# Patient Record
Sex: Male | Born: 1955
Health system: Southern US, Community
[De-identification: ages and names within clinical notes are randomized; demographics above are authoritative.]

## PROBLEM LIST (undated history)

## (undated) DIAGNOSIS — K649 Unspecified hemorrhoids: Secondary | ICD-10-CM

## (undated) DIAGNOSIS — E785 Hyperlipidemia, unspecified: Secondary | ICD-10-CM

## (undated) DIAGNOSIS — A6000 Herpesviral infection of urogenital system, unspecified: Secondary | ICD-10-CM

## (undated) DIAGNOSIS — E291 Testicular hypofunction: Secondary | ICD-10-CM

## (undated) DIAGNOSIS — G709 Myoneural disorder, unspecified: Secondary | ICD-10-CM

## (undated) DIAGNOSIS — K635 Polyp of colon: Secondary | ICD-10-CM

## (undated) DIAGNOSIS — O2230 Deep phlebothrombosis in pregnancy, unspecified trimester: Secondary | ICD-10-CM

## (undated) DIAGNOSIS — R011 Cardiac murmur, unspecified: Secondary | ICD-10-CM

## (undated) DIAGNOSIS — F32A Depression, unspecified: Secondary | ICD-10-CM

## (undated) DIAGNOSIS — F329 Major depressive disorder, single episode, unspecified: Secondary | ICD-10-CM

## (undated) DIAGNOSIS — I4892 Unspecified atrial flutter: Secondary | ICD-10-CM

## (undated) HISTORY — DX: Unspecified atrial flutter: I48.92

## (undated) HISTORY — DX: Hyperlipidemia, unspecified: E78.5

## (undated) HISTORY — DX: Depression, unspecified: F32.A

## (undated) HISTORY — DX: Major depressive disorder, single episode, unspecified: F32.9

## (undated) HISTORY — DX: Testicular hypofunction: E29.1

## (undated) HISTORY — PX: KNEE ARTHROSCOPY: SHX127

## (undated) HISTORY — DX: Cardiac murmur, unspecified: R01.1

## (undated) HISTORY — DX: Herpesviral infection of urogenital system, unspecified: A60.00

## (undated) HISTORY — DX: Polyp of colon: K63.5

## (undated) HISTORY — DX: Unspecified hemorrhoids: K64.9

---

## 1988-12-17 HISTORY — PX: VASECTOMY: SHX75

## 2013-02-09 LAB — HM COLONOSCOPY: HM COLON: NORMAL

## 2014-04-22 ENCOUNTER — Ambulatory Visit (INDEPENDENT_AMBULATORY_CARE_PROVIDER_SITE_OTHER): Payer: BC Managed Care – PPO | Admitting: Internal Medicine

## 2014-04-22 ENCOUNTER — Encounter: Payer: Self-pay | Admitting: Internal Medicine

## 2014-04-22 VITALS — BP 118/76 | HR 60 | Temp 98.2°F | Ht 70.5 in | Wt 207.5 lb

## 2014-04-22 DIAGNOSIS — E291 Testicular hypofunction: Secondary | ICD-10-CM | POA: Insufficient documentation

## 2014-04-22 DIAGNOSIS — I4892 Unspecified atrial flutter: Secondary | ICD-10-CM | POA: Insufficient documentation

## 2014-04-22 DIAGNOSIS — E781 Pure hyperglyceridemia: Secondary | ICD-10-CM

## 2014-04-22 MED ORDER — TESTOSTERONE 20.25 MG/1.25GM (1.62%) TD GEL: 4.0000 "application " | Freq: Every day | TRANSDERMAL | Status: DC

## 2014-04-22 MED ORDER — EZETIMIBE 10 MG PO TABS: 10.0000 mg | ORAL_TABLET | Freq: Every day | ORAL | Status: DC

## 2014-04-22 MED ORDER — METOPROLOL SUCCINATE ER 25 MG PO TB24: 25.0000 mg | ORAL_TABLET | Freq: Two times a day (BID) | ORAL | Status: DC

## 2014-04-22 NOTE — Assessment & Plan Note (Signed)
occasional episodes Controlled on metoprolol

## 2014-04-22 NOTE — Patient Instructions (Addendum)

## 2014-04-22 NOTE — Progress Notes (Signed)
Pre visit review using our clinic review tool, if applicable. No additional management support is needed unless otherwise documented below in the visit note. 

## 2014-04-22 NOTE — Progress Notes (Signed)
HPI  Pt presents to the clinic today to establish care. He recently moved from [place], [place]. He is transferring care from Dr. Harland Dingwall. He does need some medication refills. Otherwise, he has no concerns today.  Flu: 10/2013 Tetanus: less than 10 years Pneumovax: 2013 Zostovax: had it but unsure of date Colon Screening: 2013, polyps, 5 years PSA screening: 2014 Eye Doctor: yearly Doctor: yearly  Past Medical History  Diagnosis Date  . Depression   . Heart murmur   . Colon polyps     Current Outpatient Prescriptions  Medication Sig Dispense Refill  . aspirin 325 MG EC tablet Take 325 mg by mouth daily.      . Cholecalciferol (VITAMIN D3) 2000 UNITS TABS Take 1 capsule by mouth daily.      Marland Kitchen ezetimibe (ZETIA) 10 MG tablet Take 10 mg by mouth daily.      Marland Kitchen glucosamine-chondroitin 500-400 MG tablet Take 1 tablet by mouth 2 (two) times daily.      . metoprolol succinate (TOPROL-XL) 25 MG 24 hr tablet Take 25 mg by mouth 2 (two) times daily.      . NON FORMULARY 2 capsules daily. Celery seed extract      . Omega-3 Fatty Acids (FISH OIL) 1200 MG CAPS Take 5 capsules by mouth daily.      . Testosterone (ANDROGEL) 20.25 MG/1.25GM (1.62%) GEL Place 4 application onto the skin daily.      . valACYclovir (VALTREX) 1000 MG tablet Take 1,000 mg by mouth daily.       No current facility-administered medications for this visit.    No Known Allergies  Family History  Problem Relation Age of Onset  . Cancer Mother     breast  . Heart disease Mother   . Hyperlipidemia Mother   . Hypertension Mother   . Cancer Father     Colon  . Heart disease Father   . Alzheimer's disease Father   . Hyperlipidemia Father   . Hyperlipidemia Brother     History   Social History  . Marital Status: Married    Spouse Name: N/A    Number of Children: N/A  . Years of Education: N/A   Occupational History  . Not on file.   Social History Main Topics  . Smoking status: Never Smoker   .  Smokeless tobacco: Never Used  . Alcohol Use: 8.4 oz/week    14 Shots of liquor per week     Comment: moderate  . Drug Use: No  . Sexual Activity: Not on file   Other Topics Concern  . Not on file   Social History Narrative  . No narrative on file    ROS:  Constitutional: Denies fever, malaise, fatigue, headache or abrupt weight changes.  Respiratory: Denies difficulty breathing, shortness of breath, cough or sputum production.   Cardiovascular: Denies chest pain, chest tightness, palpitations or swelling in the hands or feet.  Neurological: Denies dizziness, difficulty with memory, difficulty with speech or problems with balance and coordination.   No other specific complaints in a complete review of systems (except as listed in HPI above).  PE:  BP 118/76  Pulse 60  Temp(Src) 98.2 F (36.8 C) (Oral)  Ht 5' 10.5" (1.791 m)  Wt 207 lb 8 oz (94.121 kg)  BMI 29.34 kg/m2  SpO2 98% Wt Readings from Last 3 Encounters:  04/22/14 207 lb 8 oz (94.121 kg)    General: Appears his stated age, well developed, well nourished in NAD. Cardiovascular:  Normal rate and rhythm. S1,S2 noted.  No murmur, rubs or gallops noted. No JVD or BLE edema. No carotid bruits noted. Pulmonary/Chest: Normal effort and positive vesicular breath sounds. No respiratory distress. No wheezes, rales or ronchi noted.  Neurological: Alert and oriented. Cranial nerves II-XII intact. Coordination normal. +DTRs bilaterally. Psychiatric: Mood and affect normal. Behavior is normal. Judgment and thought content normal.    Assessment and Plan:

## 2014-04-22 NOTE — Assessment & Plan Note (Signed)
On testosterone gel Medication refilled today

## 2014-04-22 NOTE — Assessment & Plan Note (Signed)
In a study at [facility] Just had labs yesterday He will bring a copy of them for me to review

## 2014-06-21 ENCOUNTER — Other Ambulatory Visit: Payer: Self-pay

## 2014-06-21 DIAGNOSIS — E291 Testicular hypofunction: Secondary | ICD-10-CM

## 2014-09-06 ENCOUNTER — Encounter: Payer: Self-pay | Admitting: Internal Medicine

## 2014-09-06 ENCOUNTER — Ambulatory Visit (INDEPENDENT_AMBULATORY_CARE_PROVIDER_SITE_OTHER): Payer: BC Managed Care – PPO | Admitting: Internal Medicine

## 2014-09-06 VITALS — BP 114/60 | HR 71 | Temp 98.7°F | Wt 219.0 lb

## 2014-09-06 DIAGNOSIS — B070 Plantar wart: Secondary | ICD-10-CM

## 2014-09-06 DIAGNOSIS — J069 Acute upper respiratory infection, unspecified: Secondary | ICD-10-CM

## 2014-09-06 DIAGNOSIS — J302 Other seasonal allergic rhinitis: Secondary | ICD-10-CM

## 2014-09-06 DIAGNOSIS — J309 Allergic rhinitis, unspecified: Secondary | ICD-10-CM

## 2014-09-06 NOTE — Progress Notes (Signed)
Subjective:    Patient ID: Harold Schwartz, male    DOB: 08-03-1956, 58 y.o.   MRN: [id number]  HPI Patient presents with sore throat and sinus congestion for the past two days. Has used salt gargles and mucinex with some  relief. Lost voice on Saturday, has had slight cough and some green nasal discharge. Has warts on his right foot, has been treating them with OTC wart remover, wants to know if they really are warts.  Review of Systems  Past Medical History  Diagnosis Date  . Depression   . Heart murmur   . Colon polyps     Current Outpatient Prescriptions  Medication Sig Dispense Refill  . aspirin 325 MG EC tablet Take 325 mg by mouth daily.      . Cholecalciferol (VITAMIN D3) 2000 UNITS TABS Take 1 capsule by mouth daily.      Marland Kitchen ezetimibe (ZETIA) 10 MG tablet Take 1 tablet (10 mg total) by mouth daily.  30 tablet  11  . glucosamine-chondroitin 500-400 MG tablet Take 1 tablet by mouth 2 (two) times daily.      . Magnesium 100 MG TABS Take 1 tablet by mouth.      . metoprolol succinate (TOPROL-XL) 25 MG 24 hr tablet Take 1 tablet (25 mg total) by mouth 2 (two) times daily.  60 tablet  11  . NON FORMULARY 2 capsules daily. Celery seed extract      . Omega-3 Fatty Acids (FISH OIL) 1200 MG CAPS Take 5 capsules by mouth daily.      . Testosterone (ANDROGEL) 20.25 MG/1.25GM (1.62%) GEL Place 4 application onto the skin daily.  1.25 g  5  . valACYclovir (VALTREX) 1000 MG tablet Take 1,000 mg by mouth daily.       No current facility-administered medications for this visit.    No Known Allergies  Family History  Problem Relation Age of Onset  . Cancer Mother     breast  . Heart disease Mother   . Hyperlipidemia Mother   . Hypertension Mother   . Cancer Father     Colon  . Heart disease Father   . Alzheimer's disease Father   . Hyperlipidemia Father   . Hyperlipidemia Brother     History   Social History  . Marital Status: Married    Spouse Name: N/A    Number of  Children: N/A  . Years of Education: N/A   Occupational History  . Not on file.   Social History Main Topics  . Smoking status: Never Smoker   . Smokeless tobacco: Never Used  . Alcohol Use: 8.4 oz/week    14 Shots of liquor per week     Comment: moderate  . Drug Use: No  . Sexual Activity: Not on file   Other Topics Concern  . Not on file   Social History Narrative  . No narrative on file     Constitutional: Denies fever, malaise, fatigue, headache or abrupt weight changes.  HEENT: Denies eye pain, eye redness, ear pain, ringing in the ears, wax buildup, runny nose, or bloody nose,  Respiratory: Denies difficulty breathing, shortness of breath, cough or sputum production.   Cardiovascular: Denies chest pain, chest tightness, palpitations or swelling in the hands or feet.    No other specific complaints in a complete review of systems (except as listed in HPI above).     Objective:   Physical Exam  BP 114/60  Pulse 71  Temp(Src) 98.7 F (  37.1 C) (Oral)  Wt 219 lb (99.338 kg)  SpO2 98% Wt Readings from Last 3 Encounters:  09/06/14 219 lb (99.338 kg)  04/22/14 207 lb 8 oz (94.121 kg)    General: Appears their stated age, well developed, well nourished in NAD. HEENT:Nose: mucosa pink and moist, septum midline; Throat/Mouth: Teeth present, mucosa pink and moist, no exudate, lesions or ulcerations noted.  Neck:. Neck supple, no lymphadenopathy noted.  Cardiovascular: Normal rate and rhythm. S1,S2 noted.  No murmur, rubs or gallops noted. Pulmonary/Chest: Normal effort and positive vesicular breath sounds. No respiratory distress. No wheezes, rales or ronchi noted.    BMET No results found for this basename: na, k, cl, co2, glucose, bun, creatinine, calcium, gfrnonaa, gfraa    Lipid Panel  No results found for this basename: chol, trig, hdl, cholhdl, vldl, ldlcalc    CBC No results found for this basename: wbc, rbc, hgb, hct, plt, mcv, mch, mchc, rdw, neutrabs,  lymphsabs, monoabs, eosabs, basosabs    Hgb A1C No results found for this basename: HGBA1C         Assessment & Plan:   Allergic Rhinitis  Zyrtec, flonase and continue with other supportive care  Warts See podiatry for removal of warts Please call office is symptoms worsen or do not improve

## 2014-09-06 NOTE — Patient Instructions (Addendum)
Plantar Warts Warts are benign (noncancerous) growths of the outer skin layer. They can occur at any time in life but are most common during childhood and the teen years. Warts can occur on many skin surfaces of the body. When they occur on the underside (sole) of your foot they are called plantar warts. They often emerge in groups with several small warts encircling a larger growth. CAUSES  Human papillomavirus (HPV) is the cause of plantar warts. HPV attacks a break in the skin of the foot. Walking barefoot can lead to exposure to the wart virus. Plantar warts tend to develop over areas of pressure such as the heel and ball of the foot. Plantar warts often grow into the deeper layers of skin. They may spread to other areas of the sole but cannot spread to other areas of the body. SYMPTOMS  You may also notice a growth on the undersurface of your foot. The wart may grow directly into the sole of the foot, or rise above the surface of the skin on the sole of the foot, or both. They are most often flat from pressure. Warts generally do not cause itching but may cause pain in the area of the wart when you put weight on your foot. DIAGNOSIS  Diagnosis is made by physical examination. This means your caregiver discovers it while examining your foot.  TREATMENT  There are many ways to treat plantar warts. However, warts are very tough. Sometimes it is difficult to treat them so that they go away completely and do not grow back. Any treatment must be done regularly to work. If left untreated, most plantar warts will eventually disappear over a period of one to two years. Treatments you can do at home include:  Putting duct tape over the top of the wart (occlusion) has been found to be effective over several months. The duct tape should be removed each night and reapplied until the wart has disappeared.  Placing over-the-counter medications on top of the wart to help kill the wart virus and remove the wart  tissue (salicylic acid, cantharidin, and dichloroacetic acid) are useful. These are called keratolytic agents. These medications make the skin soft and gradually layers will shed away. These compounds are usually placed on the wart each night and then covered with a bandage. They are also available in premedicated bandage form. Avoid surrounding skin when applying these liquids as these medications can burn healthy skin. The treatment may take several months of nightly use to be effective.  Cryotherapy to freeze the wart has recently become available over-the-counter for children 4 years and older. This system makes use of a soft narrow applicator connected to a bottle of compressed cold liquid that is applied directly to the wart. This medication can burn healthy skin and should be used with caution.  As with all over-the-counter medications, read the directions carefully before use. Treatments generally done in your caregiver's office include:  Some aggressive treatments may cause discomfort, discoloration, and scarring of the surrounding skin. The risks and benefits of treatment should be discussed with your caregiver.  Freezing the wart with liquid nitrogen (cryotherapy, see above).  Burning the wart with use of very high heat (cautery).  Injecting medication into the wart.  Surgically removing or laser treatment of the wart.  Your caregiver may refer you to a dermatologist for difficult to treat large-sized warts or large numbers of warts. HOME CARE INSTRUCTIONS   Soak the affected area in warm water. Dry the   area completely when you are done. Remove the top layer of softened skin, then apply the chosen topical medication and reapply a bandage.  Remove the bandage daily and file excess wart tissue (pumice stone works well for this purpose). Repeat the entire process daily or every other day for weeks until the plantar wart disappears.  Several brands of salicylic acid pads are available  as over-the-counter remedies.  Pain can be relieved by wearing a donut bandage. This is a bandage with a hole in it. The bandage is put on with the hole over the wart. This helps take the pressure off the wart and gives pain relief. To help prevent plantar warts:  Wear shoes and socks and change them daily.  Keep feet clean and dry.  Check your feet and your children's feet regularly.  Avoid direct contact with warts on other people.  Have growths or changes on your skin checked by your caregiver. Document Released: 02/23/2004 Document Revised: 04/19/2014 Document Reviewed: 08/03/2009 Exit[employer] Patient Information 2015 [employer]. This information is not intended to replace advice given to you by your health care provider. Make sure you discuss any questions you have with your health care provider.  

## 2014-09-06 NOTE — Progress Notes (Signed)
HPI  Pt presents to the clinic today with c/o sore throat, post nasal drip and nasal congestion. He reports this started 2 days ago. He reports that he is blowing green mucous out of his nose. He has had a slight cough, which is unproductive. He denies fever, chills or body aches. He has tried Mucinex, saline nasal spray and salt water gargles without relief. He has no history of allergies or breathing problems. He has not had sick contacts that he is aware of.   Also, he c/o a wart on his right foot. He noticed this 4-6 weeks ago. He has tried using the OTC wart freeze but it has not helped. He would like [place] to look at it.  Review of Systems    Past Medical History  Diagnosis Date  . Depression   . Heart murmur   . Colon polyps     Family History  Problem Relation Age of Onset  . Cancer Mother     breast  . Heart disease Mother   . Hyperlipidemia Mother   . Hypertension Mother   . Cancer Father     Colon  . Heart disease Father   . Alzheimer's disease Father   . Hyperlipidemia Father   . Hyperlipidemia Brother     History   Social History  . Marital Status: Married    Spouse Name: N/A    Number of Children: N/A  . Years of Education: N/A   Occupational History  . Not on file.   Social History Main Topics  . Smoking status: Never Smoker   . Smokeless tobacco: Never Used  . Alcohol Use: 8.4 oz/week    14 Shots of liquor per week     Comment: moderate  . Drug Use: No  . Sexual Activity: Not on file   Other Topics Concern  . Not on file   Social History Narrative  . No narrative on file    No Known Allergies   Constitutional: Positive fatigue. Denies headache, fever or abrupt weight changes.  Skin: Pt reports wart on right foot. Denies redness, rash or ulceration.  HEENT:  Positive nasal congestion and sore throat. Denies eye redness, ear pain, ringing in the ears, wax buildup, runny nose or bloody nose. Respiratory: Pt reports cough. Denies difficulty  breathing or shortness of breath.  Cardiovascular: Denies chest pain, chest tightness, palpitations or swelling in the hands or feet.   No other specific complaints in a complete review of systems (except as listed in HPI above).  Objective:   BP 114/60  Pulse 71  Temp(Src) 98.7 F (37.1 C) (Oral)  Wt 219 lb (99.338 kg)  SpO2 98%  General: Appears his stated age, well developed, well nourished in NAD. Skin: Multiple plantars warts noted on bottom of right foot, worse on big toe and first toe. HEENT:  Ears: Tm's gray and intact, normal light reflex; Nose: mucosa pink and moist, septum midline; Throat/Mouth: + PND. Teeth present, mucosa erythematous and moist, no exudate noted, no lesions or ulcerations noted.  Cardiovascular: Normal rate and rhythm. S1,S2 noted.  No murmur, rubs or gallops noted.  Pulmonary/Chest: Normal effort and positive vesicular breath sounds. No respiratory distress. No wheezes, rales or ronchi noted.      Assessment & Plan:   Viral URI/Allergies  Continue salt water and saline nasal spray Continue Mucinex Add Zyrtec OTC Watch for fever, headache, worsening facial pain or colored mucous from your cough  Plantars Wart:  Too many to try  to freeze in this office Advised him to go see a podiatrist- if insurance requires a referral, I will place on for him  RTC as needed or if symptoms persist or worsen  RTC as needed or if symptoms persist.

## 2014-09-06 NOTE — Progress Notes (Signed)
Pre visit review using our clinic review tool, if applicable. No additional management support is needed unless otherwise documented below in the visit note. 

## 2014-09-14 ENCOUNTER — Encounter: Payer: Self-pay | Admitting: Podiatry

## 2014-09-14 ENCOUNTER — Ambulatory Visit (INDEPENDENT_AMBULATORY_CARE_PROVIDER_SITE_OTHER): Payer: BC Managed Care – PPO | Admitting: Podiatry

## 2014-09-14 VITALS — BP 121/71 | HR 86 | Resp 16

## 2014-09-14 DIAGNOSIS — B079 Viral wart, unspecified: Secondary | ICD-10-CM

## 2014-09-14 MED ORDER — CIMETIDINE 800 MG PO TABS: 800.0000 mg | ORAL_TABLET | Freq: Every day | ORAL | Status: DC

## 2014-09-14 NOTE — Progress Notes (Signed)
Subjective:     Patient ID: Harold Schwartz, male   DOB: 1956-07-02, 58 y.o.   MRN: [id number]  HPI patient points to the right hallux second toe and plantar first metatarsal stating in the last 6 months he is developed a lot of lesions in this area with a rub together and he said a history in the past of warts and they are becoming moderately painful   Review of Systems  All other systems reviewed and are negative.      Objective:   Physical Exam  Nursing note and vitals reviewed. Constitutional: He is oriented to person, place, and time.  Cardiovascular: Intact distal pulses.   Musculoskeletal: Normal range of motion.  Neurological: He is oriented to person, place, and time.  Skin: Skin is warm.   neurovascular status intact with muscle strength adequate and range of motion subtalar midtarsal joint within normal limits. Patient's noted to have multiple lesions plantar aspect right hallux on the second toe and right first metatarsal that upon debridement shows pinpoint bleeding with pain to lateral pressure. Patient is found to have good digital perfusion and is noted to be well oriented x3     Assessment:     Probable verruca plantaris plantar aspect right hallux second toe and right first metatarsal    Plan:     H&P and education rendered concerning condition. Today debridement of lesions was accomplished and I applied chemical under occlusion with patient tolerated this well and I told him what to do if any blistering should occur. Also begin Tagamet oral and discussed we may do biopsy if the masses were to persist and not respond to chemical application

## 2014-09-14 NOTE — Progress Notes (Signed)
   Subjective:    Patient ID: Harold Schwartz, male    DOB: [birth year], 58 y.o.   MRN: [id number]  HPI Comments: Right foot warts on the toes and plantar foot. Cluster of warts on the right great toe and second toe right. Below the right 1st met . Its been there for a while they just started to get painful and rub against each other. Been freezing it      Review of Systems  All other systems reviewed and are negative.      Objective:   Physical Exam        Assessment & Plan:

## 2014-10-08 ENCOUNTER — Ambulatory Visit: Payer: BC Managed Care – PPO | Admitting: Podiatry

## 2014-10-12 ENCOUNTER — Encounter: Payer: Self-pay | Admitting: Podiatry

## 2014-10-12 ENCOUNTER — Ambulatory Visit (INDEPENDENT_AMBULATORY_CARE_PROVIDER_SITE_OTHER): Payer: BC Managed Care – PPO | Admitting: Podiatry

## 2014-10-12 DIAGNOSIS — B079 Viral wart, unspecified: Secondary | ICD-10-CM

## 2014-10-12 DIAGNOSIS — B078 Other viral warts: Secondary | ICD-10-CM

## 2014-10-12 NOTE — Progress Notes (Signed)
Subjective:     Patient ID: Harold Schwartz, male   DOB: [birth year], 58 y.o.   MRN: [id number]  HPI patient states and doing pretty well with my right foot with a lesions improving and I did develop some blistering and I'm having no trouble taking the oral medicine   Review of Systems     Objective:   Physical Exam Neurovascular status intact with numerous keratotic lesions right hallux second toe and first metatarsal that are diminished but still present    Assessment:     Verruca plantaris diminished improved but present    Plan:     Debrided tissue fully and applied chemical and sterile dressings. Gave instructions on what to do with blistering should occur and reappoint if symptoms persist

## 2014-10-15 ENCOUNTER — Telehealth: Payer: Self-pay | Admitting: *Deleted

## 2014-10-15 MED ORDER — CEPHALEXIN 500 MG PO CAPS: 500.0000 mg | ORAL_CAPSULE | Freq: Three times a day (TID) | ORAL | Status: DC

## 2014-10-15 NOTE — Telephone Encounter (Signed)
Saw Dr Paulla Dolly for plantars warts on Tuesday and one seems to be infected. Wants to know what to do. No allergies to medications

## 2014-10-15 NOTE — Telephone Encounter (Signed)
Send in keflex and needs to be seen either Regal Monday here or me in [facility] Tuesday.

## 2014-10-18 ENCOUNTER — Ambulatory Visit (INDEPENDENT_AMBULATORY_CARE_PROVIDER_SITE_OTHER): Payer: BC Managed Care – PPO | Admitting: Podiatry

## 2014-10-18 DIAGNOSIS — B079 Viral wart, unspecified: Secondary | ICD-10-CM

## 2014-10-18 DIAGNOSIS — L03031 Cellulitis of right toe: Secondary | ICD-10-CM

## 2014-10-18 DIAGNOSIS — B078 Other viral warts: Secondary | ICD-10-CM

## 2014-10-18 DIAGNOSIS — L02611 Cutaneous abscess of right foot: Secondary | ICD-10-CM

## 2014-10-18 NOTE — Progress Notes (Signed)
He presents today with concerns of infection to the second digit right foot hallux right foot and sub-first metatarsophalangeal joint right foot. He was recently seen by another one of our doctors who applied a blistering agent to 3 sets of warts on his right foot. This then blistered resulting in cellulitis of the second toe. Antibiotics were called in last Friday for 10 days he started soaking in Epsom salts. He states that he's doing much better than he was previously in the foot is no longer hurting.  Objective: Vital signs are stable he is alert and oriented 3. The warts appear to be blistered and will more than likely slough off. Some residual cellulitis right second toe. No signs of deep space infection.  Assessment: Well-healing cellulitis second toe right foot well-healing warts right foot.  Plan: Continue his antibiotics until completed continue soaking until the cellulitis has resolved. Follow-up with [place] in the near future.

## 2014-11-24 ENCOUNTER — Other Ambulatory Visit: Payer: Self-pay | Admitting: Internal Medicine

## 2014-11-24 NOTE — Telephone Encounter (Signed)
Rx faxed to pharmacy  

## 2014-11-24 NOTE — Telephone Encounter (Signed)
Last filled 04/22/14 with 5 refills--last OV with you in reference to hypogonadism 04/2014--please advise

## 2015-02-04 ENCOUNTER — Encounter: Payer: Self-pay | Admitting: Family Medicine

## 2015-02-04 ENCOUNTER — Ambulatory Visit (INDEPENDENT_AMBULATORY_CARE_PROVIDER_SITE_OTHER): Payer: BC Managed Care – PPO | Admitting: Family Medicine

## 2015-02-04 VITALS — BP 110/80 | HR 51 | Temp 97.7°F | Ht 70.5 in | Wt 212.5 lb

## 2015-02-04 DIAGNOSIS — R252 Cramp and spasm: Secondary | ICD-10-CM

## 2015-02-04 LAB — CBC WITH DIFFERENTIAL/PLATELET
BASOS ABS: 0 10*3/uL (ref 0.0–0.1)
Basophils Relative: 0.5 % (ref 0.0–3.0)
EOS ABS: 0.2 10*3/uL (ref 0.0–0.7)
Eosinophils Relative: 3.3 % (ref 0.0–5.0)
HCT: 46.2 % (ref 39.0–52.0)
Hemoglobin: 15.9 g/dL (ref 13.0–17.0)
LYMPHS PCT: 36.8 % (ref 12.0–46.0)
Lymphs Abs: 1.7 10*3/uL (ref 0.7–4.0)
MCHC: 34.5 g/dL (ref 30.0–36.0)
MCV: 90.3 fl (ref 78.0–100.0)
Monocytes Absolute: 0.4 10*3/uL (ref 0.1–1.0)
Monocytes Relative: 8.6 % (ref 3.0–12.0)
NEUTROS ABS: 2.4 10*3/uL (ref 1.4–7.7)
Neutrophils Relative %: 50.8 % (ref 43.0–77.0)
PLATELETS: 145 10*3/uL — AB (ref 150.0–400.0)
RBC: 5.12 Mil/uL (ref 4.22–5.81)
RDW: 13.9 % (ref 11.5–15.5)
WBC: 4.8 10*3/uL (ref 4.0–10.5)

## 2015-02-04 LAB — COMPREHENSIVE METABOLIC PANEL
ALT: 21 U/L (ref 0–53)
AST: 21 U/L (ref 0–37)
Albumin: 4.1 g/dL (ref 3.5–5.2)
Alkaline Phosphatase: 41 U/L (ref 39–117)
BUN: 20 mg/dL (ref 6–23)
CO2: 30 mEq/L (ref 19–32)
CREATININE: 1.12 mg/dL (ref 0.40–1.50)
Calcium: 9.7 mg/dL (ref 8.4–10.5)
Chloride: 104 mEq/L (ref 96–112)
GFR: 71.07 mL/min (ref >60.00)
Glucose, Bld: 87 mg/dL (ref 70–99)
Potassium: 4.4 mEq/L (ref 3.5–5.1)
Sodium: 139 mEq/L (ref 135–145)
Total Bilirubin: 0.7 mg/dL (ref 0.2–1.2)
Total Protein: 7.4 g/dL (ref 6.0–8.3)

## 2015-02-04 LAB — TSH: TSH: 2.2 u[IU]/mL (ref 0.35–4.50)

## 2015-02-04 LAB — FERRITIN: Ferritin: 188.5 ng/mL (ref 22.0–322.0)

## 2015-02-04 MED ORDER — CYCLOBENZAPRINE HCL 10 MG PO TABS: 10.0000 mg | ORAL_TABLET | Freq: Every evening | ORAL | Status: DC | PRN

## 2015-02-04 MED ORDER — VALACYCLOVIR HCL 1 G PO TABS: 1000.0000 mg | ORAL_TABLET | Freq: Every day | ORAL | Status: DC

## 2015-02-04 NOTE — Patient Instructions (Signed)
Stop at lab on way out. Can use muscle relaxant for cramping if severe. Push fluids.

## 2015-02-04 NOTE — Progress Notes (Signed)
   Subjective:    Patient ID: Harold Schwartz, male    DOB: 09/29/1956, 59 y.o.   MRN: [id number]  HPI   59 year old male presents with new onset leg cramps.  Seen for severe hamstring cramp (? Abs at that time).. Referred to PT, given ibuprofen. Improved until last several months, now more frequent in last week, severe last night.. Muscle twanging in calf and hamstring. Worst at night.ofen, tried mustard. Took wife's diazepam.. This finally let him go to sleep.   No swelling in right leg.   No recent changeiIn activity. Walks daily, stretches.  Not on a statin.  Drinks a lot of water.   He has  history of radiculopathy  On right.. Has chronic right foot numbness.   Review of Systems  Constitutional: Negative for fever and fatigue.  HENT: Negative for ear pain.   Eyes: Negative for pain.  Respiratory: Negative for cough, shortness of breath and wheezing.   Cardiovascular: Negative for chest pain.       Objective:   Physical Exam  Constitutional: Vital signs are normal. He appears well-developed and well-nourished.  HENT:  Head: Normocephalic.  Right Ear: Hearing normal.  Left Ear: Hearing normal.  Nose: Nose normal.  Mouth/Throat: Oropharynx is clear and moist and mucous membranes are normal.  Neck: Trachea normal. Carotid bruit is not present. No thyroid mass and no thyromegaly present.  Cardiovascular: Normal rate and regular rhythm.  Exam reveals no gallop, no distant heart sounds and no friction rub.   No murmur heard. Pulses:      Popliteal pulses are 1+ on the right side, and 1+ on the left side.       Dorsalis pedis pulses are 1+ on the right side, and 1+ on the left side.       Posterior tibial pulses are 1+ on the right side, and 1+ on the left side.  No peripheral edema  Pulmonary/Chest: Effort normal and breath sounds normal. No respiratory distress.  Musculoskeletal:       Lumbar back: Normal.  Neurological: He has normal strength. He displays no atrophy. No  sensory deficit. He exhibits normal muscle tone. Gait normal.  Skin: Skin is warm, dry and intact. No rash noted.  Psychiatric: He has a normal mood and affect. His speech is normal and behavior is normal. Thought content normal.          Assessment & Plan:

## 2015-02-04 NOTE — Assessment & Plan Note (Signed)
No clear etiology. No sign of DVT, etc. Will eval with labs.. Cbc, ferritin for anemia and low iron, TSH to eval thyroid and CMET to check electrolytes, etc.  Pt can sue muscle relaxant and NSAID as needed for now.

## 2015-02-04 NOTE — Progress Notes (Signed)
Pre visit review using our clinic review tool, if applicable. No additional management support is needed unless otherwise documented below in the visit note. 

## 2015-02-09 ENCOUNTER — Other Ambulatory Visit: Payer: Self-pay | Admitting: Internal Medicine

## 2015-02-09 DIAGNOSIS — R7989 Other specified abnormal findings of blood chemistry: Secondary | ICD-10-CM

## 2015-02-18 ENCOUNTER — Other Ambulatory Visit (INDEPENDENT_AMBULATORY_CARE_PROVIDER_SITE_OTHER): Payer: BC Managed Care – PPO

## 2015-02-18 DIAGNOSIS — R7989 Other specified abnormal findings of blood chemistry: Secondary | ICD-10-CM

## 2015-02-18 LAB — CBC WITH DIFFERENTIAL/PLATELET
Basophils Absolute: 0 10*3/uL (ref 0.0–0.1)
Basophils Relative: 0.4 % (ref 0.0–3.0)
EOS ABS: 0.2 10*3/uL (ref 0.0–0.7)
EOS PCT: 3.9 % (ref 0.0–5.0)
HCT: 46.8 % (ref 39.0–52.0)
Hemoglobin: 16.4 g/dL (ref 13.0–17.0)
LYMPHS PCT: 42.9 % (ref 12.0–46.0)
Lymphs Abs: 2.5 10*3/uL (ref 0.7–4.0)
MCHC: 35.1 g/dL (ref 30.0–36.0)
MCV: 88.6 fl (ref 78.0–100.0)
MONO ABS: 0.5 10*3/uL (ref 0.1–1.0)
Monocytes Relative: 7.7 % (ref 3.0–12.0)
NEUTROS PCT: 45.1 % (ref 43.0–77.0)
Neutro Abs: 2.7 10*3/uL (ref 1.4–7.7)
PLATELETS: 144 10*3/uL — AB (ref 150.0–400.0)
RBC: 5.28 Mil/uL (ref 4.22–5.81)
RDW: 13.9 % (ref 11.5–15.5)
WBC: 5.9 10*3/uL (ref 4.0–10.5)

## 2015-02-21 LAB — PATHOLOGIST SMEAR REVIEW

## 2015-04-15 ENCOUNTER — Other Ambulatory Visit: Payer: Self-pay | Admitting: Internal Medicine

## 2015-04-15 DIAGNOSIS — E349 Endocrine disorder, unspecified: Secondary | ICD-10-CM

## 2015-04-15 DIAGNOSIS — Z0000 Encounter for general adult medical examination without abnormal findings: Secondary | ICD-10-CM

## 2015-04-18 ENCOUNTER — Encounter (INDEPENDENT_AMBULATORY_CARE_PROVIDER_SITE_OTHER): Payer: Self-pay

## 2015-04-18 ENCOUNTER — Other Ambulatory Visit (INDEPENDENT_AMBULATORY_CARE_PROVIDER_SITE_OTHER): Payer: BC Managed Care – PPO

## 2015-04-18 DIAGNOSIS — E291 Testicular hypofunction: Secondary | ICD-10-CM | POA: Diagnosis not present

## 2015-04-18 DIAGNOSIS — Z0000 Encounter for general adult medical examination without abnormal findings: Secondary | ICD-10-CM

## 2015-04-18 DIAGNOSIS — Z125 Encounter for screening for malignant neoplasm of prostate: Secondary | ICD-10-CM

## 2015-04-18 DIAGNOSIS — E349 Endocrine disorder, unspecified: Secondary | ICD-10-CM

## 2015-04-18 LAB — CBC
HEMATOCRIT: 43.4 % (ref 39.0–52.0)
Hemoglobin: 14.9 g/dL (ref 13.0–17.0)
MCHC: 34.3 g/dL (ref 30.0–36.0)
MCV: 89.6 fl (ref 78.0–100.0)
Platelets: 130 10*3/uL — ABNORMAL LOW (ref 150.0–400.0)
RBC: 4.84 Mil/uL (ref 4.22–5.81)
RDW: 13.9 % (ref 11.5–15.5)
WBC: 3.3 10*3/uL — ABNORMAL LOW (ref 4.0–10.5)

## 2015-04-18 LAB — COMPREHENSIVE METABOLIC PANEL
ALBUMIN: 3.7 g/dL (ref 3.5–5.2)
ALK PHOS: 46 U/L (ref 39–117)
ALT: 21 U/L (ref 0–53)
AST: 20 U/L (ref 0–37)
BILIRUBIN TOTAL: 0.5 mg/dL (ref 0.2–1.2)
BUN: 19 mg/dL (ref 6–23)
CALCIUM: 9.1 mg/dL (ref 8.4–10.5)
CO2: 28 mEq/L (ref 19–32)
CREATININE: 1.11 mg/dL (ref 0.40–1.50)
Chloride: 105 mEq/L (ref 96–112)
GFR: 71.76 mL/min (ref >60.00)
GLUCOSE: 82 mg/dL (ref 70–99)
POTASSIUM: 4.3 meq/L (ref 3.5–5.1)
Sodium: 139 mEq/L (ref 135–145)
TOTAL PROTEIN: 6.7 g/dL (ref 6.0–8.3)

## 2015-04-18 LAB — LIPID PANEL
CHOL/HDL RATIO: 3
Cholesterol: 169 mg/dL (ref 0–200)
HDL: 58.6 mg/dL (ref >39.00)
LDL Cholesterol: 97 mg/dL (ref 0–99)
NonHDL: 110.4
TRIGLYCERIDES: 66 mg/dL (ref 0.0–149.0)
VLDL: 13.2 mg/dL (ref 0.0–40.0)

## 2015-04-18 LAB — TESTOSTERONE: TESTOSTERONE: 539.11 ng/dL (ref 300.00–890.00)

## 2015-04-18 LAB — PSA: PSA: 2.34 ng/mL (ref 0.10–4.00)

## 2015-04-25 ENCOUNTER — Ambulatory Visit (INDEPENDENT_AMBULATORY_CARE_PROVIDER_SITE_OTHER): Payer: BC Managed Care – PPO | Admitting: Internal Medicine

## 2015-04-25 ENCOUNTER — Encounter: Payer: Self-pay | Admitting: Internal Medicine

## 2015-04-25 VITALS — BP 124/78 | HR 68 | Temp 98.3°F | Ht 71.0 in | Wt 201.0 lb

## 2015-04-25 DIAGNOSIS — Z0000 Encounter for general adult medical examination without abnormal findings: Secondary | ICD-10-CM

## 2015-04-25 DIAGNOSIS — Z23 Encounter for immunization: Secondary | ICD-10-CM | POA: Diagnosis not present

## 2015-04-25 DIAGNOSIS — E291 Testicular hypofunction: Secondary | ICD-10-CM | POA: Diagnosis not present

## 2015-04-25 DIAGNOSIS — A6000 Herpesviral infection of urogenital system, unspecified: Secondary | ICD-10-CM | POA: Insufficient documentation

## 2015-04-25 DIAGNOSIS — E781 Pure hyperglyceridemia: Secondary | ICD-10-CM

## 2015-04-25 DIAGNOSIS — I4892 Unspecified atrial flutter: Secondary | ICD-10-CM | POA: Diagnosis not present

## 2015-04-25 MED ORDER — METOPROLOL SUCCINATE ER 25 MG PO TB24: 25.0000 mg | ORAL_TABLET | Freq: Two times a day (BID) | ORAL | Status: DC

## 2015-04-25 MED ORDER — EZETIMIBE 10 MG PO TABS: 10.0000 mg | ORAL_TABLET | Freq: Every day | ORAL | Status: DC

## 2015-04-25 MED ORDER — TESTOSTERONE 20.25 MG/1.25GM (1.62%) TD GEL: 4.0000 "application " | Freq: Every day | TRANSDERMAL | Status: DC

## 2015-04-25 MED ORDER — ASPIRIN EC 81 MG PO TBEC: 81.0000 mg | DELAYED_RELEASE_TABLET | Freq: Every day | ORAL | Status: DC

## 2015-04-25 MED ORDER — VALACYCLOVIR HCL 1 G PO TABS: 1000.0000 mg | ORAL_TABLET | Freq: Every day | ORAL | Status: DC

## 2015-04-25 NOTE — Assessment & Plan Note (Addendum)
Continue Toprol Will cut ASA to 81 mg given low platelets CBC reviewed Toprol refilled today

## 2015-04-25 NOTE — Progress Notes (Signed)
Subjective:    Patient ID: Harold Schwartz, male    DOB: February 25, 1956, 59 y.o.   MRN: [id number]  HPI  Pt presents to the clinic today for his annual exam. He is also due for follow up of chronic medical conditions.  Flu: 10/2013 Tetanus: not sure, but thinks it is getting close to 10 years Pneumovax: 01/18/2012 Zostovax: did have chicken pox as a child PSA Screening: yearly, 04/18/2015 Colon Screening: 2013 at [facility] in [place]: > 1 year ago Dentist: biannually  He continues to c/on numbness in his right foot and muscle spasms in his right calf, hamstring and hand. This is intermittent. Sitting too much seems to make it worse. Stretching makes it better. He did see Dr. Randa Spike 02/04/15 for the same, note reviewed. He does take Flexeril some times to help relieve the pain.  Hypertriglyceridemia: Triglycerides at goal on Zetia and Fish Oil. He does consume a low fat diet.  Paroxysmal Afib: No issues on Toprol and ASA. He denies chest pain or shortness of breath.  Hypotestosteronism: Using Androgel daily. He denies side effects from the medication.  Genital Herpes: No recent outbreaks on Valtrex.  Diet: Low carb diet. He does eat lots of fruits and veggies. Exercise: He is walking every day. He is doing some weight lifting. He has lost 18 lbs since 08/2014.  Review of Systems      Past Medical History  Diagnosis Date  . Depression   . Heart murmur   . Colon polyps     Current Outpatient Prescriptions  Medication Sig Dispense Refill  . aspirin 325 MG EC tablet Take 325 mg by mouth daily.    . Cholecalciferol (VITAMIN D3) 2000 UNITS TABS Take 1 capsule by mouth daily.    . cyclobenzaprine (FLEXERIL) 10 MG tablet Take 1 tablet (10 mg total) by mouth at bedtime as needed for muscle spasms. 15 tablet 0  . ezetimibe (ZETIA) 10 MG tablet Take 1 tablet (10 mg total) by mouth daily. 30 tablet 11  . glucosamine-chondroitin 500-400 MG tablet Take 1 tablet  by mouth 2 (two) times daily.    . Magnesium 100 MG TABS Take 1 tablet by mouth.    . metoprolol succinate (TOPROL-XL) 25 MG 24 hr tablet Take 1 tablet (25 mg total) by mouth 2 (two) times daily. 60 tablet 11  . NON FORMULARY 2 capsules daily. Celery seed extract    . Omega-3 Fatty Acids (FISH OIL) 1200 MG CAPS Take 5 capsules by mouth daily.    . Testosterone (ANDROGEL) 20.25 MG/1.25GM (1.62%) GEL Place 4 application onto the skin daily. 1.25 g 5  . valACYclovir (VALTREX) 1000 MG tablet Take 1 tablet (1,000 mg total) by mouth daily. 90 tablet 0   No current facility-administered medications for this visit.    No Known Allergies  Family History  Problem Relation Age of Onset  . Cancer Mother     breast  . Heart disease Mother   . Hyperlipidemia Mother   . Hypertension Mother   . Cancer Father     Colon  . Heart disease Father   . Alzheimer's disease Father   . Hyperlipidemia Father   . Hyperlipidemia Brother     History   Social History  . Marital Status: Married    Spouse Name: N/A  . Number of Children: N/A  . Years of Education: N/A   Occupational History  . Not on file.   Social History Main Topics  .  Smoking status: Never Smoker   . Smokeless tobacco: Never Used  . Alcohol Use: 8.4 oz/week    14 Shots of liquor per week     Comment: moderate  . Drug Use: No  . Sexual Activity: Not on file   Other Topics Concern  . Not on file   Social History Narrative     Constitutional: Denies fever, malaise, fatigue, headache or abrupt weight changes.  HEENT: Denies eye pain, eye redness, ear pain, ringing in the ears, wax buildup, runny nose, nasal congestion, bloody nose, or sore throat. Respiratory: Denies difficulty breathing, shortness of breath, cough or sputum production.   Cardiovascular: Denies chest pain, chest tightness, palpitations or swelling in the hands or feet.  Gastrointestinal: Denies abdominal pain, bloating, constipation, diarrhea or blood in the  stool.  GU: Denies urgency, frequency, pain with urination, burning sensation, blood in urine, odor or discharge. Musculoskeletal: Pt reports muscle cramps in right hand, hamstring and calf. Denies decrease in range of motion, difficulty with gait, or joint pain and swelling.  Skin: Denies redness, rashes, lesions or ulcercations.  Neurological: Pt reports numbness in his right foot. Denies dizziness, difficulty with memory, difficulty with speech or problems with balance and coordination.   No other specific complaints in a complete review of systems (except as listed in HPI above).  Objective:   Physical Exam   BP 124/78 mmHg  Pulse 68  Temp(Src) 98.3 F (36.8 C) (Oral)  Ht 5\' 11"  (1.803 m)  Wt 201 lb (91.173 kg)  BMI 28.05 kg/m2  SpO2 98% Wt Readings from Last 3 Encounters:  04/25/15 201 lb (91.173 kg)  02/04/15 212 lb 8 oz (96.389 kg)  09/06/14 219 lb (99.338 kg)    General: Appears his stated age, well developed, well nourished in NAD. Skin: Warm, dry and intact. No rashes, lesions or ulcerations noted. HEENT: Head: normal shape and size; Eyes: sclera white, no icterus, conjunctiva pink, PERRLA and EOMs intact; Ears: Tm's gray and intact, normal light reflex; Throat/Mouth: Teeth present, mucosa pink and moist, no exudate, lesions or ulcerations noted.  Neck: Neck supple, trachea midline. No massses, lumps or thyromegaly present.  Cardiovascular: Normal rate and rhythm. S1,S2 noted.  No murmur, rubs or gallops noted. No JVD or BLE edema. No carotid bruits noted. Pulmonary/Chest: Normal effort and positive vesicular breath sounds. No respiratory distress. No wheezes, rales or ronchi noted.  Abdomen: Soft and nontender. Normal bowel sounds, no bruits noted. No distention or masses noted. Liver, spleen and kidneys non palpable. Musculoskeletal: Normal range of motion. Strength 5/5 BUE/BLE. No difficulty with gait.  Neurological: Alert and oriented. Cranial nerves II-XII grossly  intact. Coordination normal.  Psychiatric: Mood and affect normal. Behavior is normal. Judgment and thought content normal.     BMET    Component Value Date/Time   NA 139 04/18/2015 0827   K 4.3 04/18/2015 0827   CL 105 04/18/2015 0827   CO2 28 04/18/2015 0827   GLUCOSE 82 04/18/2015 0827   BUN 19 04/18/2015 0827   CREATININE 1.11 04/18/2015 0827   CALCIUM 9.1 04/18/2015 0827    Lipid Panel     Component Value Date/Time   CHOL 169 04/18/2015 0827   TRIG 66.0 04/18/2015 0827   HDL 58.60 04/18/2015 0827   CHOLHDL 3 04/18/2015 0827   VLDL 13.2 04/18/2015 0827   LDLCALC 97 04/18/2015 0827    CBC    Component Value Date/Time   WBC 3.3* 04/18/2015 0827   RBC 4.84 04/18/2015 0827  HGB 14.9 04/18/2015 0827   HCT 43.4 04/18/2015 0827   PLT 130.0* 04/18/2015 0827   MCV 89.6 04/18/2015 0827   MCHC 34.3 04/18/2015 0827   RDW 13.9 04/18/2015 0827   LYMPHSABS 2.5 02/18/2015 1058   MONOABS 0.5 02/18/2015 1058   EOSABS 0.2 02/18/2015 1058   BASOSABS 0.0 02/18/2015 1058    Hgb A1C No results found for: HGBA1C      Assessment & Plan:   Preventative Health Maintenance:  Labs done 1 week ago reviewed, low WBC, low platelets- will have him cut back his Aspirin from 325 mg to 81 mg daily He will get a flu shot in the fall Tdap today Encouraged him to visit an eye doctor at least once yearly All other health maintenance UTD He will find out about his shingles vaccine to see if he has had the vaccine or not  RTC in 1 year for annual exam

## 2015-04-25 NOTE — Progress Notes (Signed)
Pre visit review using our clinic review tool, if applicable. No additional management support is needed unless otherwise documented below in the visit note. 

## 2015-04-25 NOTE — Patient Instructions (Signed)

## 2015-04-25 NOTE — Assessment & Plan Note (Signed)
Testosterone level 540 No issues on Adrogel Medication refilled today

## 2015-04-25 NOTE — Assessment & Plan Note (Signed)
No outbreaks on Valtrex Medications refilled today

## 2015-04-25 NOTE — Addendum Note (Signed)
Addended by: Lurlean Nanny on: 04/25/2015 11:58 AM   Modules accepted: Orders

## 2015-04-25 NOTE — Assessment & Plan Note (Signed)
Triglycerides at goal on Zetia and Fish oil Lipid profile and CMET reviewed today Medications refilled per request

## 2015-05-03 ENCOUNTER — Ambulatory Visit (INDEPENDENT_AMBULATORY_CARE_PROVIDER_SITE_OTHER): Payer: BC Managed Care – PPO | Admitting: *Deleted

## 2015-05-03 DIAGNOSIS — Z23 Encounter for immunization: Secondary | ICD-10-CM

## 2015-05-30 ENCOUNTER — Telehealth: Payer: Self-pay

## 2015-05-30 NOTE — Telephone Encounter (Signed)
Called [facility] and confirmed 1 bottle for quantity as directed--they stated that would not be enough only giving about 6 pumps

## 2015-05-30 NOTE — Telephone Encounter (Signed)
1 bottle

## 2015-05-30 NOTE — Telephone Encounter (Signed)
Mary with [facility] left v/m requesting cb with verification of quantity for androgel written on 04/25/2015.Please advise.

## 2015-05-30 NOTE — Telephone Encounter (Signed)
150 grams bottle

## 2015-05-31 ENCOUNTER — Encounter: Payer: Self-pay | Admitting: Internal Medicine

## 2015-05-31 NOTE — Telephone Encounter (Signed)
Spoke to [facility] and gave verbal order for new Rx for 150g bottle--spoke to her yesterday and confirmed 1 bottle for 30 day supply as well

## 2015-09-09 ENCOUNTER — Ambulatory Visit (INDEPENDENT_AMBULATORY_CARE_PROVIDER_SITE_OTHER): Payer: BC Managed Care – PPO | Admitting: Internal Medicine

## 2015-09-09 ENCOUNTER — Encounter: Payer: Self-pay | Admitting: Internal Medicine

## 2015-09-09 VITALS — BP 104/60 | HR 86 | Temp 98.0°F | Wt 190.8 lb

## 2015-09-09 DIAGNOSIS — Z23 Encounter for immunization: Secondary | ICD-10-CM

## 2015-09-09 DIAGNOSIS — K645 Perianal venous thrombosis: Secondary | ICD-10-CM

## 2015-09-09 MED ORDER — HYDROCORTISONE ACE-PRAMOXINE 1-1 % RE FOAM: 1.0000 | Freq: Two times a day (BID) | RECTAL | Status: DC

## 2015-09-09 NOTE — Progress Notes (Signed)
Subjective:    Patient ID: Harold Schwartz, male    DOB: 1956/01/23, 59 y.o.   MRN: [id number]  HPI  Pt presents to the clinic today with c/o rectal pain. This started a few days ago. He reports he has a hemorrhoid that has "popped out". It is painful but not bleeding. He reports he has not been constipated but he does sometimes strain to have a BM. He denies abdominal pain. He has tried Preparation H and Tucks pads with minimal relief. He has had internal hemorrhoids in the past. His last colonoscopy was in 2014.  Review of Systems      Past Medical History  Diagnosis Date  . Depression   . Heart murmur   . Colon polyps   . Genital herpes     Current Outpatient Prescriptions  Medication Sig Dispense Refill  . aspirin EC 81 MG tablet Take 1 tablet (81 mg total) by mouth daily. 30 tablet 0  . ezetimibe (ZETIA) 10 MG tablet Take 1 tablet (10 mg total) by mouth daily. 30 tablet 11  . glucosamine-chondroitin 500-400 MG tablet Take 1 tablet by mouth 2 (two) times daily.    . metoprolol succinate (TOPROL-XL) 25 MG 24 hr tablet Take 1 tablet (25 mg total) by mouth 2 (two) times daily. 60 tablet 11  . NON FORMULARY 2 capsules daily. Celery seed extract    . Omega-3 Fatty Acids (FISH OIL) 1200 MG CAPS Take 5 capsules by mouth daily.    . Testosterone (ANDROGEL) 20.25 MG/1.25GM (1.62%) GEL Place 4 application onto the skin daily. 1.25 g 5  . valACYclovir (VALTREX) 1000 MG tablet Take 1 tablet (1,000 mg total) by mouth daily. 90 tablet 3   No current facility-administered medications for this visit.    No Known Allergies  Family History  Problem Relation Age of Onset  . Cancer Mother     breast  . Heart disease Mother   . Hyperlipidemia Mother   . Hypertension Mother   . Cancer Father     Colon  . Heart disease Father   . Alzheimer's disease Father   . Hyperlipidemia Father   . Hyperlipidemia Brother     Social History   Social History  . Marital Status: Married    Spouse  Name: N/A  . Number of Children: N/A  . Years of Education: N/A   Occupational History  . Not on file.   Social History Main Topics  . Smoking status: Never Smoker   . Smokeless tobacco: Never Used  . Alcohol Use: 8.4 oz/week    14 Shots of liquor per week     Comment: moderate  . Drug Use: No  . Sexual Activity: Not on file   Other Topics Concern  . Not on file   Social History Narrative     Constitutional: Denies fever, malaise, fatigue, headache or abrupt weight changes.  Respiratory: Denies difficulty breathing, shortness of breath, cough or sputum production.   Cardiovascular: Denies chest pain, chest tightness, palpitations or swelling in the hands or feet.  Gastrointestinal: Pt reports rectal pain. Denies abdominal pain, bloating, constipation, diarrhea or blood in the stool.    No other specific complaints in a complete review of systems (except as listed in HPI above).  Objective:   Physical Exam  BP 104/60 mmHg  Pulse 86  Temp(Src) 98 F (36.7 C) (Oral)  Wt 190 lb 12 oz (86.524 kg)  SpO2 97%  Wt Readings from Last 3 Encounters:  09/09/15  190 lb 12 oz (86.524 kg)  04/25/15 201 lb (91.173 kg)  02/04/15 212 lb 8 oz (96.389 kg)    General: Appears his stated age, well developed, well nourished in NAD. Abdomen: Soft and nontender. Normal bowel sounds. No distention or masses noted.  Rectal: Prolapsed, thrombosed hemorrhoids noted. Not bleeding.  BMET    Component Value Date/Time   NA 139 04/18/2015 0827   K 4.3 04/18/2015 0827   CL 105 04/18/2015 0827   CO2 28 04/18/2015 0827   GLUCOSE 82 04/18/2015 0827   BUN 19 04/18/2015 0827   CREATININE 1.11 04/18/2015 0827   CALCIUM 9.1 04/18/2015 0827    Lipid Panel     Component Value Date/Time   CHOL 169 04/18/2015 0827   TRIG 66.0 04/18/2015 0827   HDL 58.60 04/18/2015 0827   CHOLHDL 3 04/18/2015 0827   VLDL 13.2 04/18/2015 0827   LDLCALC 97 04/18/2015 0827    CBC    Component Value Date/Time    WBC 3.3* 04/18/2015 0827   RBC 4.84 04/18/2015 0827   HGB 14.9 04/18/2015 0827   HCT 43.4 04/18/2015 0827   PLT 130.0* 04/18/2015 0827   MCV 89.6 04/18/2015 0827   MCHC 34.3 04/18/2015 0827   RDW 13.9 04/18/2015 0827   LYMPHSABS 2.5 02/18/2015 1058   MONOABS 0.5 02/18/2015 1058   EOSABS 0.2 02/18/2015 1058   BASOSABS 0.0 02/18/2015 1058    Hgb A1C No results found for: HGBA1C       Assessment & Plan:   Thrombosed hemorrhoid:  Discussed staying well hydrated  Avoid straining, use Colace if needed eRx for Proctofoam to affected area BID Let me know if not improving, we can have you seen by GI  RTC as needed or if symptoms persist or worsen

## 2015-09-09 NOTE — Progress Notes (Signed)
Pre visit review using our clinic review tool, if applicable. No additional management support is needed unless otherwise documented below in the visit note. 

## 2015-09-09 NOTE — Patient Instructions (Signed)

## 2015-09-15 ENCOUNTER — Encounter: Payer: Self-pay | Admitting: Internal Medicine

## 2015-09-15 ENCOUNTER — Other Ambulatory Visit: Payer: Self-pay | Admitting: Internal Medicine

## 2015-09-15 DIAGNOSIS — K648 Other hemorrhoids: Secondary | ICD-10-CM

## 2015-09-15 DIAGNOSIS — K6289 Other specified diseases of anus and rectum: Secondary | ICD-10-CM

## 2015-09-15 MED ORDER — HYDROCORTISONE ACE-PRAMOXINE 1-1 % RE FOAM: 1.0000 | Freq: Two times a day (BID) | RECTAL | Status: DC

## 2015-09-16 ENCOUNTER — Encounter: Payer: Self-pay | Admitting: Gastroenterology

## 2015-09-16 ENCOUNTER — Ambulatory Visit (INDEPENDENT_AMBULATORY_CARE_PROVIDER_SITE_OTHER): Payer: BC Managed Care – PPO | Admitting: Gastroenterology

## 2015-09-16 VITALS — BP 102/60 | HR 60 | Ht 70.5 in | Wt 192.0 lb

## 2015-09-16 DIAGNOSIS — K6289 Other specified diseases of anus and rectum: Secondary | ICD-10-CM

## 2015-09-16 DIAGNOSIS — K649 Unspecified hemorrhoids: Secondary | ICD-10-CM | POA: Diagnosis not present

## 2015-09-16 MED ORDER — PRAMOXINE HCL 1 % RE FOAM: 1.0000 "application " | Freq: Three times a day (TID) | RECTAL | Status: DC | PRN

## 2015-09-16 MED ORDER — LIDOCAINE 5 % EX OINT: 1.0000 "application " | TOPICAL_OINTMENT | CUTANEOUS | Status: DC | PRN

## 2015-09-16 NOTE — Patient Instructions (Signed)
Start [employer] and begin using Protofoam.  We are referring you to [facility]. Please make you way there now they are expecting you.   We have sent medications to your pharmacy for you to pick up at your convenience.

## 2015-09-16 NOTE — Progress Notes (Signed)
HPI :  59 y/o male seen in consultation for hemorrhoids from Webb Silversmith NP. He reports he has a history of internal hemorrhoids for 6 months, which have been minor of time with mild symptoms. He reports they have been irritated with some occasional swelling. Previously used preparation H which helped. He reports he has had some painful hemorrhoids in the past week or so, markedly different than previous symptoms. He thinks they have prolapsed or something has changed. He has severe pain in the anal area. He thinks sitting can cause him discomfort and make it worse. Bowel movements are not painful. He reports regular BMs, non bloody. He has some bright red blood with wipes only, no blood in the stool he is aware of. No straining or constipation. He has been using some proctofoam, which he thinks has helped somewhat. He used it for 5 days, but ran out. He is using metamucil every day. He has never had a prior thrombosed hemorrhoid. No fevers.   He had his last colonoscopy done in Feb 2014 with hemorrhoids noted and a 30mm adenoma removed. He has had polyps on multiple prior colonoscopies. His father had colon cancer when he was age 22. He is next due for a surveillance colonoscopy in 2019.     Past Medical History  Diagnosis Date  . Depression   . Heart murmur   . Colon polyps   . Genital herpes   . Atrial flutter   . Hyperlipidemia   . Hypogonadism in male   . Hemorrhoids      Past Surgical History  Procedure Laterality Date  . Knee arthroscopy    . Vasectomy  1990   Family History  Problem Relation Age of Onset  . Breast cancer Mother   . Heart disease Mother   . Hyperlipidemia Mother   . Hypertension Mother   . Colon cancer Father   . Heart disease Father   . Alzheimer's disease Father   . Hyperlipidemia Father   . Hyperlipidemia Brother    Social History  Substance Use Topics  . Smoking status: Never Smoker   . Smokeless tobacco: Never Used  . Alcohol Use: 8.4 oz/week      14 Shots of liquor per week     Comment: moderate   Current Outpatient Prescriptions  Medication Sig Dispense Refill  . aspirin EC 81 MG tablet Take 1 tablet (81 mg total) by mouth daily. 30 tablet 0  . ezetimibe (ZETIA) 10 MG tablet Take 1 tablet (10 mg total) by mouth daily. 30 tablet 11  . glucosamine-chondroitin 500-400 MG tablet Take 1 tablet by mouth 2 (two) times daily.    . hydrocortisone-pramoxine (PROCTOFOAM-HC) rectal foam Place 1 applicator rectally 2 (two) times daily. 10 g 0  . metoprolol succinate (TOPROL-XL) 25 MG 24 hr tablet Take 1 tablet (25 mg total) by mouth 2 (two) times daily. 60 tablet 11  . NON FORMULARY 2 capsules daily. Celery seed extract    . Omega-3 Fatty Acids (FISH OIL) 1200 MG CAPS Take 5 capsules by mouth daily.    . Testosterone (ANDROGEL) 20.25 MG/1.25GM (1.62%) GEL Place 4 application onto the skin daily. 1.25 g 5  . valACYclovir (VALTREX) 1000 MG tablet Take 1 tablet (1,000 mg total) by mouth daily. 90 tablet 3  . lidocaine (XYLOCAINE) 5 % ointment Apply 1 application topically as needed. 35.44 g 0  . pramoxine (PROCTOFOAM) 1 % foam Place 1 application rectally 3 (three) times daily as needed for itching.  15 g 0   No current facility-administered medications for this visit.   No Known Allergies   Review of Systems: All systems reviewed and negative except where noted in HPI.   CBC May 2016 normal Hgb  Physical Exam: BP 102/60 mmHg  Pulse 60  Ht 5' 10.5" (1.791 m)  Wt 192 lb (87.091 kg)  BMI 27.15 kg/m2 Constitutional: Pleasant,well-developed, white male in no acute distress. HEENT: Normocephalic and atraumatic. Conjunctivae are normal. No scleral icterus. Neck supple.  Cardiovascular: Normal rate, regular rhythm.  Pulmonary/chest: Effort normal and breath sounds normal. No wheezing, rales or rhonchi. Abdominal: Soft, nondistended, nontender. Bowel sounds active throughout. There are no masses palpable. No hepatomegaly. Rectal exam:  large edematous hemorrhoid noted externally with purplish hue, extremely tender to touch, no anal fissure appreciated. No mass on DRE Extremities: no edema Lymphadenopathy: No cervical adenopathy noted. Neurological: Alert and oriented to person place and time. Skin: Skin is warm and dry. No rashes noted. Psychiatric: Normal mood and affect. Behavior is normal.   ASSESSMENT AND PLAN: 58 y/o male here in consultation for hemorrhoids, previously with mild symptoms of internal hemorrhoids, now with what appears to be large external hemorrhoid with suspected possible thrombosis. No fissure appreciated. Colonoscopy 2014 showed moderate hemorrhoids. Symptoms ongoing for a week. I counseled him that in this setting, if he does have a thrombosed hemorrhoid, surgery is most useful within 72 hours of onset however do recommend a surgical evaluation to see if it needs to be excised or lanced given the severe pain he is having. In the interim recommend continued daily fiber supplement and proctofoam. I also wrote him for some lidocaine cream to use PRN and counseled him on Sitz baths. We spoke with the general surgery office and they graciously agreed to evaluate him today. He will see them and follow up with [place] as needed. Pending he has no other new issues, for surveillance purposes his next colonoscopy is due in 2019.   Ludington Cellar, MD [facility]

## 2015-09-27 ENCOUNTER — Encounter: Payer: Self-pay | Admitting: Internal Medicine

## 2015-09-27 ENCOUNTER — Ambulatory Visit (INDEPENDENT_AMBULATORY_CARE_PROVIDER_SITE_OTHER): Payer: BC Managed Care – PPO | Admitting: Internal Medicine

## 2015-09-27 VITALS — BP 120/82 | HR 60 | Temp 97.8°F | Wt 193.5 lb

## 2015-09-27 DIAGNOSIS — B9789 Other viral agents as the cause of diseases classified elsewhere: Principal | ICD-10-CM

## 2015-09-27 DIAGNOSIS — J069 Acute upper respiratory infection, unspecified: Secondary | ICD-10-CM | POA: Diagnosis not present

## 2015-09-27 MED ORDER — HYDROCODONE-HOMATROPINE 5-1.5 MG/5ML PO SYRP: 5.0000 mL | ORAL_SOLUTION | Freq: Three times a day (TID) | ORAL | Status: DC | PRN

## 2015-09-27 NOTE — Progress Notes (Signed)
Subjective:    Patient ID: Harold Schwartz, male    DOB: Dec 31, 1955, 59 y.o.   MRN: [id number]  HPI Mr. Sliter is a 59 year old male who presents today with chief compliant of cough, nasal and chest congestion and general malaise for 2 days. He says that he has had chills on and off.  His grandchildren are in town and they have been sick as well.  He has coughed up green and brown mucous and has tried Robitussin without relief. He says that the worse part in the cough and feels like it just not getting better.    Review of Systems  Constitutional: Positive for chills. Negative for fever and fatigue.  HENT: Positive for congestion, postnasal drip and rhinorrhea. Negative for sore throat.   Respiratory: Positive for cough. Negative for shortness of breath and wheezing.   Cardiovascular: Negative for chest pain, palpitations and leg swelling.  Gastrointestinal: Negative.   Musculoskeletal: Negative for myalgias, back pain and arthralgias.   Family History  Problem Relation Age of Onset  . Breast cancer Mother   . Heart disease Mother   . Hyperlipidemia Mother   . Hypertension Mother   . Colon cancer Father   . Heart disease Father   . Alzheimer's disease Father   . Hyperlipidemia Father   . Hyperlipidemia Brother    Current Outpatient Prescriptions on File Prior to Visit  Medication Sig Dispense Refill  . aspirin EC 81 MG tablet Take 1 tablet (81 mg total) by mouth daily. 30 tablet 0  . ezetimibe (ZETIA) 10 MG tablet Take 1 tablet (10 mg total) by mouth daily. 30 tablet 11  . glucosamine-chondroitin 500-400 MG tablet Take 1 tablet by mouth 2 (two) times daily.    . hydrocortisone-pramoxine (PROCTOFOAM-HC) rectal foam Place 1 applicator rectally 2 (two) times daily. 10 g 0  . lidocaine (XYLOCAINE) 5 % ointment Apply 1 application topically as needed. 35.44 g 0  . metoprolol succinate (TOPROL-XL) 25 MG 24 hr tablet Take 1 tablet (25 mg total) by mouth 2 (two) times daily. 60 tablet 11    . NON FORMULARY 2 capsules daily. Celery seed extract    . Omega-3 Fatty Acids (FISH OIL) 1200 MG CAPS Take 5 capsules by mouth daily.    . pramoxine (PROCTOFOAM) 1 % foam Place 1 application rectally 3 (three) times daily as needed for itching. 15 g 0  . Testosterone (ANDROGEL) 20.25 MG/1.25GM (1.62%) GEL Place 4 application onto the skin daily. 1.25 g 5  . valACYclovir (VALTREX) 1000 MG tablet Take 1 tablet (1,000 mg total) by mouth daily. 90 tablet 3   No current facility-administered medications on file prior to visit.       Objective:   Physical Exam  Constitutional: He is oriented to person, place, and time. He appears well-developed and well-nourished.  HENT:  Head: Normocephalic and atraumatic.  Right Ear: External ear normal.  Left Ear: External ear normal.  Mouth/Throat: Oropharyngeal exudate present.  Neck: Normal range of motion. Neck supple.  Cardiovascular: Normal rate, regular rhythm and normal heart sounds.   No murmur heard. Pulmonary/Chest: Effort normal.  Rhonci in bases  Musculoskeletal: Normal range of motion.  Lymphadenopathy:    He has no cervical adenopathy.  Neurological: He is alert and oriented to person, place, and time.  Skin: Skin is warm and dry.  Psychiatric: He has a normal mood and affect.     BP 120/82 mmHg  Pulse 60  Temp(Src) 97.8 F (36.6 C) (  Oral)  Wt 193 lb 8 oz (87.771 kg)  SpO2 98%      Assessment & Plan:  1. Viral URI  Hycodan syrup to use at night Mucinex bid for 5 days. Call office if not feeling better by Friday.

## 2015-09-27 NOTE — Patient Instructions (Signed)
Upper Respiratory Infection, Adult Most upper respiratory infections (URIs) are a viral infection of the air passages leading to the lungs. A URI affects the nose, throat, and upper air passages. The most common type of URI is nasopharyngitis and is typically referred to as "the common cold." URIs run their course and usually go away on their own. Most of the time, a URI does not require medical attention, but sometimes a bacterial infection in the upper airways can follow a viral infection. This is called a secondary infection. Sinus and middle ear infections are common types of secondary upper respiratory infections. Bacterial pneumonia can also complicate a URI. A URI can worsen asthma and chronic obstructive pulmonary disease (COPD). Sometimes, these complications can require emergency medical care and may be life threatening.  CAUSES Almost all URIs are caused by viruses. A virus is a type of germ and can spread from one person to another.  RISKS FACTORS You may be at risk for a URI if:   You smoke.   You have chronic heart or lung disease.  You have a weakened defense (immune) system.   You are very young or very old.   You have nasal allergies or asthma.  You work in crowded or poorly ventilated areas.  You work in health care facilities or schools. SIGNS AND SYMPTOMS  Symptoms typically develop 2-3 days after you come in contact with a cold virus. Most viral URIs last 7-10 days. However, viral URIs from the influenza virus (flu virus) can last 14-18 days and are typically more severe. Symptoms may include:   Runny or stuffy (congested) nose.   Sneezing.   Cough.   Sore throat.   Headache.   Fatigue.   Fever.   Loss of appetite.   Pain in your forehead, behind your eyes, and over your cheekbones (sinus pain).  Muscle aches.  DIAGNOSIS  Your health care provider may diagnose a URI by:  Physical exam.  Tests to check that your symptoms are not due to  another condition such as:  Strep throat.  Sinusitis.  Pneumonia.  Asthma. TREATMENT  A URI goes away on its own with time. It cannot be cured with medicines, but medicines may be prescribed or recommended to relieve symptoms. Medicines may help:  Reduce your fever.  Reduce your cough.  Relieve nasal congestion. HOME CARE INSTRUCTIONS   Take medicines only as directed by your health care provider.   Gargle warm saltwater or take cough drops to comfort your throat as directed by your health care provider.  Use a warm mist humidifier or inhale steam from a shower to increase air moisture. This may make it easier to breathe.  Drink enough fluid to keep your urine clear or pale yellow.   Eat soups and other clear broths and maintain good nutrition.   Rest as needed.   Return to work when your temperature has returned to normal or as your health care provider advises. You may need to stay home longer to avoid infecting others. You can also use a face mask and careful hand washing to prevent spread of the virus.  Increase the usage of your inhaler if you have asthma.   Do not use any tobacco products, including cigarettes, chewing tobacco, or electronic cigarettes. If you need help quitting, ask your health care provider. PREVENTION  The best way to protect yourself from getting a cold is to practice good hygiene.   Avoid oral or hand contact with people with cold   symptoms.   Wash your hands often if contact occurs.  There is no clear evidence that vitamin C, vitamin E, echinacea, or exercise reduces the chance of developing a cold. However, it is always recommended to get plenty of rest, exercise, and practice good nutrition.  SEEK MEDICAL CARE IF:   You are getting worse rather than better.   Your symptoms are not controlled by medicine.   You have chills.  You have worsening shortness of breath.  You have brown or red mucus.  You have yellow or brown nasal  discharge.  You have pain in your face, especially when you bend forward.  You have a fever.  You have swollen neck glands.  You have pain while swallowing.  You have white areas in the back of your throat. SEEK IMMEDIATE MEDICAL CARE IF:   You have severe or persistent:  Headache.  Ear pain.  Sinus pain.  Chest pain.  You have chronic lung disease and any of the following:  Wheezing.  Prolonged cough.  Coughing up blood.  A change in your usual mucus.  You have a stiff neck.  You have changes in your:  Vision.  Hearing.  Thinking.  Mood. MAKE SURE YOU:   Understand these instructions.  Will watch your condition.  Will get help right away if you are not doing well or get worse.   This information is not intended to replace advice given to you by your health care provider. Make sure you discuss any questions you have with your health care provider.   Document Released: 05/29/2001 Document Revised: 04/19/2015 Document Reviewed: 03/10/2014 Elsevier Interactive Patient Education 2016 [employer] Inc[employer]  

## 2015-09-27 NOTE — Progress Notes (Signed)
HPI  Pt presents to the clinic today with c/o nasal congestion and cough. This started 2 days ago. The cough is productive of yellow mucous. He denies any wheezing or shortness of breath. He has had chills but denies fever or body aches. He has tried Robitussin and Afrin with minimal relief. He has a history of allergies or breathing problems. He has not had sick contacts.  Review of Systems      Past Medical History  Diagnosis Date  . Depression   . Heart murmur   . Colon polyps   . Genital herpes   . Atrial flutter ([place])   . Hyperlipidemia   . Hypogonadism in male   . Hemorrhoids     Family History  Problem Relation Age of Onset  . Breast cancer Mother   . Heart disease Mother   . Hyperlipidemia Mother   . Hypertension Mother   . Colon cancer Father   . Heart disease Father   . Alzheimer's disease Father   . Hyperlipidemia Father   . Hyperlipidemia Brother     Social History   Social History  . Marital Status: Married    Spouse Name: N/A  . Number of Children: 2  . Years of Education: N/A   Occupational History  . Engineer, site    Social History Main Topics  . Smoking status: Never Smoker   . Smokeless tobacco: Never Used  . Alcohol Use: 8.4 oz/week    14 Shots of liquor per week     Comment: moderate  . Drug Use: No  . Sexual Activity: Not on file   Other Topics Concern  . Not on file   Social History Narrative    No Known Allergies   Constitutional: Denies headache, fatigue, fever or abrupt weight changes.  HEENT:  Positive nasal congestion, sore throat. Denies eye redness, eye pain, pressure behind the eyes, facial pain, ear pain, ringing in the ears, wax buildup, runny nose or bloody nose. Respiratory: Positive cough. Denies difficulty breathing or shortness of breath.  Cardiovascular: Denies chest pain, chest tightness, palpitations or swelling in the hands or feet.   No other specific complaints in a complete review of systems (except as  listed in HPI above).  Objective:   BP 120/82 mmHg  Pulse 60  Temp(Src) 97.8 F (36.6 C) (Oral)  Wt 193 lb 8 oz (87.771 kg)  SpO2 98%  Wt Readings from Last 3 Encounters:  09/27/15 193 lb 8 oz (87.771 kg)  09/16/15 192 lb (87.091 kg)  09/09/15 190 lb 12 oz (86.524 kg)     General: Appears his stated age, well developed, well nourished in NAD. HEENT: Head: normal shape and size, no sinus tenderness noted; Eyes: sclera white, no icterus, conjunctiva pink; Ears: Tm's gray and intact, normal light reflex; Nose: mucosa pink and moist, septum midline; Throat/Mouth: + PND. Teeth present, mucosa pink and moist, no exudate noted, no lesions or ulcerations noted.  Neck: No cervical lymphadenopathy.  Cardiovascular: Normal rate and rhythm. S1,S2 noted.  No murmur, rubs or gallops noted.  Pulmonary/Chest: Normal effort and positive vesicular breath sounds. No respiratory distress. No wheezes, rales or ronchi noted.      Assessment & Plan:   Upper Respiratory Infection:  Viral Get some rest and drink plenty of water Do salt water gargles for the sore throat Start Mucinex BID for the next few days Rx for Hycodan cough syrup If no improvement by Friday, will call in zpack  RTC as needed  or if symptoms persist.

## 2015-09-27 NOTE — Progress Notes (Signed)
Pre visit review using our clinic review tool, if applicable. No additional management support is needed unless otherwise documented below in the visit note. 

## 2015-10-20 ENCOUNTER — Encounter: Payer: Self-pay | Admitting: Internal Medicine

## 2015-12-04 ENCOUNTER — Emergency Department
Admission: EM | Admit: 2015-12-04 | Discharge: 2015-12-04 | Disposition: A | Discharge status: Home / Self Care | Payer: BC Managed Care – PPO | Attending: Emergency Medicine | Admitting: Emergency Medicine

## 2015-12-04 ENCOUNTER — Encounter: Payer: Self-pay | Admitting: Emergency Medicine

## 2015-12-04 ENCOUNTER — Emergency Department: Payer: BC Managed Care – PPO

## 2015-12-04 DIAGNOSIS — R110 Nausea: Secondary | ICD-10-CM | POA: Diagnosis not present

## 2015-12-04 DIAGNOSIS — Z79899 Other long term (current) drug therapy: Secondary | ICD-10-CM | POA: Insufficient documentation

## 2015-12-04 DIAGNOSIS — Z7982 Long term (current) use of aspirin: Secondary | ICD-10-CM | POA: Diagnosis not present

## 2015-12-04 DIAGNOSIS — R079 Chest pain, unspecified: Secondary | ICD-10-CM | POA: Diagnosis present

## 2015-12-04 DIAGNOSIS — Z8679 Personal history of other diseases of the circulatory system: Secondary | ICD-10-CM | POA: Insufficient documentation

## 2015-12-04 LAB — BASIC METABOLIC PANEL
Anion gap: 3 — ABNORMAL LOW (ref 5–15)
BUN: 20 mg/dL (ref 6–20)
CALCIUM: 8.8 mg/dL — AB (ref 8.9–10.3)
CO2: 27 mmol/L (ref 22–32)
CREATININE: 0.98 mg/dL (ref 0.61–1.24)
Chloride: 109 mmol/L (ref 101–111)
GFR calc non Af Amer: >60 mL/min (ref >60)
Glucose, Bld: 100 mg/dL — ABNORMAL HIGH (ref 65–99)
Potassium: 4.1 mmol/L (ref 3.5–5.1)
SODIUM: 139 mmol/L (ref 135–145)

## 2015-12-04 LAB — CBC
HCT: 44.5 % (ref 40.0–52.0)
Hemoglobin: 14.8 g/dL (ref 13.0–18.0)
MCH: 30.2 pg (ref 26.0–34.0)
MCHC: 33.3 g/dL (ref 32.0–36.0)
MCV: 90.7 fL (ref 80.0–100.0)
PLATELETS: 115 10*3/uL — AB (ref 150–440)
RBC: 4.91 MIL/uL (ref 4.40–5.90)
RDW: 14.2 % (ref 11.5–14.5)
WBC: 4.7 10*3/uL (ref 3.8–10.6)

## 2015-12-04 LAB — TROPONIN I: Troponin I: <0.03 ng/mL (ref <0.031)

## 2015-12-04 MED ORDER — METOPROLOL TARTRATE 25 MG PO TABS
25.0000 mg | ORAL_TABLET | Freq: Once | ORAL | Status: AC
  Administered 2015-12-04: 25 mg via ORAL
  Filled 2015-12-04: qty 1

## 2015-12-04 MED ORDER — RANITIDINE HCL 150 MG PO TABS: 150.0000 mg | ORAL_TABLET | Freq: Two times a day (BID) | ORAL | Status: DC

## 2015-12-04 NOTE — Discharge Instructions (Signed)
It is unclear what caused your discomfort in the right chest, to right neck and right shoulder. You been essentially pain-free in the emergency department. You're to cardiac enzymes were negative and her EKGs looked good. Follow-up with your regular doctor. He you should also follow-up with a cardiologist for reevaluation. He can call Dr. Nehemiah Massed for an appointment.  One possibility for your discomfort is esophageal. Take ranitidine (Zantac) twice a day for the next 3 weeks, then as needed or as advised by your physician. Return to the emergency department if your further chest pain or other urgent concerns.  Nonspecific Chest Pain  Chest pain can be caused by many different conditions. There is always a chance that your pain could be related to something serious, such as a heart attack or a blood clot in your lungs. Chest pain can also be caused by conditions that are not life-threatening. If you have chest pain, it is very important to follow up with your health care provider. CAUSES  Chest pain can be caused by:  Heartburn.  Pneumonia or bronchitis.  Anxiety or stress.  Inflammation around your heart (pericarditis) or lung (pleuritis or pleurisy).  A blood clot in your lung.  A collapsed lung (pneumothorax). It can develop suddenly on its own (spontaneous pneumothorax) or from trauma to the chest.  Shingles infection (varicella-zoster virus).  Heart attack.  Damage to the bones, muscles, and cartilage that make up your chest wall. This can include:  Bruised bones due to injury.  Strained muscles or cartilage due to frequent or repeated coughing or overwork.  Fracture to one or more ribs.  Sore cartilage due to inflammation (costochondritis). RISK FACTORS  Risk factors for chest pain may include:  Activities that increase your risk for trauma or injury to your chest.  Respiratory infections or conditions that cause frequent coughing.  Medical conditions or overeating that  can cause heartburn.  Heart disease or family history of heart disease.  Conditions or health behaviors that increase your risk of developing a blood clot.  Having had chicken pox (varicella zoster). SIGNS AND SYMPTOMS Chest pain can feel like:  Burning or tingling on the surface of your chest or deep in your chest.  Crushing, pressure, aching, or squeezing pain.  Dull or sharp pain that is worse when you move, cough, or take a deep breath.  Pain that is also felt in your back, neck, shoulder, or arm, or pain that spreads to any of these areas. Your chest pain may come and go, or it may stay constant. DIAGNOSIS Lab tests or other studies may be needed to find the cause of your pain. Your health care provider may have you take a test called an ambulatory ECG (electrocardiogram). An ECG records your heartbeat patterns at the time the test is performed. You may also have other tests, such as:  Transthoracic echocardiogram (TTE). During echocardiography, sound waves are used to create a picture of all of the heart structures and to look at how blood flows through your heart.  Transesophageal echocardiogram (TEE).This is a more advanced imaging test that obtains images from inside your body. It allows your health care provider to see your heart in finer detail.  Cardiac monitoring. This allows your health care provider to monitor your heart rate and rhythm in real time.  Holter monitor. This is a portable device that records your heartbeat and can help to diagnose abnormal heartbeats. It allows your health care provider to track your heart activity for several  days, if needed.  Stress tests. These can be done through exercise or by taking medicine that makes your heart beat more quickly.  Blood tests.  Imaging tests. TREATMENT  Your treatment depends on what is causing your chest pain. Treatment may include:  Medicines. These may include:  Acid blockers for  heartburn.  Anti-inflammatory medicine.  Pain medicine for inflammatory conditions.  Antibiotic medicine, if an infection is present.  Medicines to dissolve blood clots.  Medicines to treat coronary artery disease.  Supportive care for conditions that do not require medicines. This may include:  Resting.  Applying heat or cold packs to injured areas.  Limiting activities until pain decreases. HOME CARE INSTRUCTIONS  If you were prescribed an antibiotic medicine, finish it all even if you start to feel better.  Avoid any activities that bring on chest pain.  Do not use any tobacco products, including cigarettes, chewing tobacco, or electronic cigarettes. If you need help quitting, ask your health care provider.  Do not drink alcohol.  Take medicines only as directed by your health care provider.  Keep all follow-up visits as directed by your health care provider. This is important. This includes any further testing if your chest pain does not go away.  If heartburn is the cause for your chest pain, you may be told to keep your head raised (elevated) while sleeping. This reduces the chance that acid will go from your stomach into your esophagus.  Make lifestyle changes as directed by your health care provider. These may include:  Getting regular exercise. Ask your health care provider to suggest some activities that are safe for you.  Eating a heart-healthy diet. A registered dietitian can help you to learn healthy eating options.  Maintaining a healthy weight.  Managing diabetes, if necessary.  Reducing stress. SEEK MEDICAL CARE IF:  Your chest pain does not go away after treatment.  You have a rash with blisters on your chest.  You have a fever. SEEK IMMEDIATE MEDICAL CARE IF:   Your chest pain is worse.  You have an increasing cough, or you cough up blood.  You have severe abdominal pain.  You have severe weakness.  You faint.  You have chills.  You  have sudden, unexplained chest discomfort.  You have sudden, unexplained discomfort in your arms, back, neck, or jaw.  You have shortness of breath at any time.  You suddenly start to sweat, or your skin gets clammy.  You feel nauseous or you vomit.  You suddenly feel light-headed or dizzy.  Your heart begins to beat quickly, or it feels like it is skipping beats. These symptoms may represent a serious problem that is an emergency. Do not wait to see if the symptoms will go away. Get medical help right away. Call your local emergency services (911 in the [place] Do not drive yourself to the hospital.   This information is not intended to replace advice given to you by your health care provider. Make sure you discuss any questions you have with your health care provider.   Document Released: 09/12/2005 Document Revised: 12/24/2014 Document Reviewed: 07/09/2014 Elsevier Interactive Patient Education [employer].

## 2015-12-04 NOTE — ED Notes (Signed)
Pt transported to xray 

## 2015-12-04 NOTE — ED Provider Notes (Signed)
[facility] Emergency Department Provider Note  ____________________________________________  Time seen: 39  I have reviewed the triage vital signs and the nursing notes.  History by:  Patient  HISTORY  Chief Complaint Chest Pain     HPI Harold Schwartz is a 59 y.o. male who awoke at 1 AM with discomfort in his central to right chest. The discomfort radiated to his right shoulder and right neck. First it was moderate, then it became worse for a short period. It then he said he was able to sleep, though not deeply. It was positional with the patient feeling more comfortable lying on his side and back.He awoke this morning he had another episode of increased discomfort. He presented to the emergency department with ongoing pain with some nausea in the right chest to right shoulder. On my interview and exam, the pain has gone and the patient is in no acute distress.   He does have a history of atrial flutter in the past. He takes metoprolol to control this. His last dose of metoprolol last night. He denies these symptoms as being consistent with prior atrial flutter. He does not think he was tachycardic last night. He denies a history of other cardiac problems. The patient due to a 325 mg aspirin this morning.     Past Medical History  Diagnosis Date  . Depression   . Heart murmur   . Colon polyps   . Genital herpes   . Atrial flutter ([place])   . Hyperlipidemia   . Hypogonadism in male   . Hemorrhoids     Patient Active Problem List   Diagnosis Date Noted  . Hemorrhoids 09/16/2015  . Genital herpes 04/25/2015  . Hypogonadism male 04/22/2014  . Hypertriglyceridemia 04/22/2014  . Paroxysmal atrial flutter ([place]) 04/22/2014    Past Surgical History  Procedure Laterality Date  . Knee arthroscopy    . Vasectomy  1990    Current Outpatient Rx  Name  Route  Sig  Dispense  Refill  . aspirin EC 81 MG tablet   Oral   Take 1 tablet (81 mg total) by mouth  daily.   30 tablet   0   . ezetimibe (ZETIA) 10 MG tablet   Oral   Take 1 tablet (10 mg total) by mouth daily.   30 tablet   11   . glucosamine-chondroitin 500-400 MG tablet   Oral   Take 1 tablet by mouth 2 (two) times daily.         Marland Kitchen HYDROcodone-homatropine (HYCODAN) 5-1.5 MG/5ML syrup   Oral   Take 5 mLs by mouth every 8 (eight) hours as needed for cough.   120 mL   0   . hydrocortisone-pramoxine (PROCTOFOAM-HC) rectal foam   Rectal   Place 1 applicator rectally 2 (two) times daily.   10 g   0   . lidocaine (XYLOCAINE) 5 % ointment   Topical   Apply 1 application topically as needed.   35.44 g   0   . metoprolol succinate (TOPROL-XL) 25 MG 24 hr tablet   Oral   Take 1 tablet (25 mg total) by mouth 2 (two) times daily.   60 tablet   11   . NON FORMULARY      2 capsules daily. Celery seed extract         . Omega-3 Fatty Acids (FISH OIL) 1200 MG CAPS   Oral   Take 5 capsules by mouth daily.         Marland Kitchen  pramoxine (PROCTOFOAM) 1 % foam   Rectal   Place 1 application rectally 3 (three) times daily as needed for itching.   15 g   0   . ranitidine (ZANTAC) 150 MG tablet   Oral   Take 1 tablet (150 mg total) by mouth 2 (two) times daily.   60 tablet   1   . Testosterone (ANDROGEL) 20.25 MG/1.25GM (1.62%) GEL   Transdermal   Place 4 application onto the skin daily.   1.25 g   5   . valACYclovir (VALTREX) 1000 MG tablet   Oral   Take 1 tablet (1,000 mg total) by mouth daily.   90 tablet   3     Allergies Review of patient's allergies indicates no known allergies.  Family History  Problem Relation Age of Onset  . Breast cancer Mother   . Heart disease Mother   . Hyperlipidemia Mother   . Hypertension Mother   . Colon cancer Father   . Heart disease Father   . Alzheimer's disease Father   . Hyperlipidemia Father   . Hyperlipidemia Brother     Social History Social History  Substance Use Topics  . Smoking status: Never Smoker   .  Smokeless tobacco: Never Used  . Alcohol Use: 8.4 oz/week    14 Shots of liquor per week     Comment: moderate    Review of Systems  Constitutional: Negative for fever/chills. ENT: Negative for congestion. Cardiovascular: Positive for chest pain. History of atrial flutter, no flutter recently. Respiratory: Negative for cough. Gastrointestinal: Negative for abdominal pain, vomiting and diarrhea. Genitourinary: Negative for dysuria. Musculoskeletal: No back pain. Skin: Negative for rash. Neurological: Negative for headache or focal weakness   10-point ROS otherwise negative.  ____________________________________________   PHYSICAL EXAM:  VITAL SIGNS: ED Triage Vitals  Enc Vitals Group     BP 12/04/15 0818 132/80 mmHg     Pulse Rate 12/04/15 0818 57     Resp 12/04/15 0818 18     Temp 12/04/15 0818 98.3 F (36.8 C)     Temp Source 12/04/15 0818 Oral     SpO2 12/04/15 0818 97 %     Weight 12/04/15 0818 188 lb (85.276 kg)     Height 12/04/15 0818 5\' 11"  (1.803 m)     Head Cir --      Peak Flow --      Pain Score 12/04/15 0819 3     Pain Loc --      Pain Edu? --      Excl. in [place]? --     Constitutional:  Alert and oriented. Well appearing and in no distress. ENT   Head: Normocephalic and atraumatic.   Nose: No congestion/rhinnorhea.       Mouth: No erythema, no swelling   Cardiovascular: Normal rate, regular rhythm, no murmur noted Respiratory:  Normal respiratory effort, no tachypnea.    Breath sounds are clear and equal bilaterally.  Gastrointestinal: Soft, no distention. Nontender Back: No muscle spasm, no tenderness, no CVA tenderness. Musculoskeletal: No deformity noted. Nontender with normal range of motion in all extremities.  No noted edema. Neurologic:  Communicative. Normal appearing spontaneous movement in all 4 extremities. No gross focal neurologic deficits are appreciated.  Skin:  Skin is warm, dry. No rash noted. Psychiatric: Mood and affect are  normal. Speech and behavior are normal.  ____________________________________________    LABS (pertinent positives/negatives)  Labs Reviewed  BASIC METABOLIC PANEL - Abnormal; Notable for the following:  Glucose, Bld 100 (*)    Calcium 8.8 (*)    Anion gap 3 (*)    All other components within normal limits  CBC - Abnormal; Notable for the following:    Platelets 115 (*)    All other components within normal limits  TROPONIN I  TROPONIN I     ____________________________________________   EKG  ED ECG REPORT I, Emmajo Bennette W, the attending physician, personally viewed and interpreted this ECG.   Date: 12/04/2015  EKG Time: 813  Rate: 57  Rhythm: Sinus bradycardia with sinus arrhythmia  Axis: Normal  Intervals: Normal  ST&T Change: None noted   ----------------------------------------- 11:42 AM on 12/04/2015 -----------------------------------------  ED ECG REPORT I, Maleyah Evans W, the attending physician, personally viewed and interpreted this ECG.   Date: 12/04/2015  EKG Time: 1138  Rate: 51  Rhythm: sinus bradycardia  Axis: Normal  Intervals: Normal  ST&T Change: None noted  ____________________________________________    RADIOLOGY  Chest x-ray FINDINGS: Normal heart size. Clear lungs. No pneumothorax. No pleural effusion.  IMPRESSION: No active cardiopulmonary disease.  ____________________________________________   PROCEDURES    ____________________________________________   INITIAL IMPRESSION / ASSESSMENT AND PLAN / ED COURSE  Pertinent labs & imaging results that were available during my care of the patient were reviewed by me and considered in my medical decision making (see chart for details).  Overall well-appearing 20-year-old male in no acute distress. His pain began at 1 AM last night and appears to have been intermittent, waxing and waning. He has no pain at this time. It is unclear what the source this discomfort is.  Chest x-ray looks normal. Troponin is negative. Exam is benign. Patient has no pain at this time.  We will observe the patient emergency department, obtain a second troponin, and respond to any additional symptoms or discomfort he has. If he remains comfortable and there is no further evidence of an acute problem, I believe he'll be stable and safe for discharge with follow-up with either cardiology or his primary physician.  ----------------------------------------- 11:29 AM on 12/04/2015 -----------------------------------------  On reexamination 45 minutes ago, the patient remained pain-free. He has not had any discomfort in the emergency department. He looks well.   Now, his repeat troponin has returned normal at less than 0.03. My initial suspicion for cardiac was for a low. It is unclear what caused his chest discomfort earlier. With him pain-free and relieved, appearing stable, no acute distress, I will move to discharge the patient home. I do believe this may have been more esophageal, given its time of onset, ongoing symptoms when horizontal, and no improvement in the emergency department.  I will ask the patient to take ranitidine or similar medication for the next 3 weeks.   ____________________________________________   FINAL CLINICAL IMPRESSION(S) / ED DIAGNOSES  Final diagnoses:  Chest pain, unspecified chest pain type      Ahmed Prima, MD 12/04/15 1143

## 2015-12-04 NOTE — ED Notes (Signed)
Pt presents to ED with chest pain that started this morning. Located in right chest that radiates to right side of neck. Pt states "right now it feels like tightness, which is better then it has been". Pt is alert and oriented at present time. Pain 3/10

## 2015-12-05 ENCOUNTER — Ambulatory Visit (INDEPENDENT_AMBULATORY_CARE_PROVIDER_SITE_OTHER): Payer: BC Managed Care – PPO | Admitting: Internal Medicine

## 2015-12-05 ENCOUNTER — Encounter: Payer: Self-pay | Admitting: Internal Medicine

## 2015-12-05 VITALS — BP 126/78 | HR 65 | Temp 98.1°F | Wt 191.0 lb

## 2015-12-05 DIAGNOSIS — E291 Testicular hypofunction: Secondary | ICD-10-CM | POA: Diagnosis not present

## 2015-12-05 DIAGNOSIS — E349 Endocrine disorder, unspecified: Secondary | ICD-10-CM

## 2015-12-05 MED ORDER — TESTOSTERONE 30 MG/ACT TD SOLN: TRANSDERMAL | Status: DC

## 2015-12-05 NOTE — Progress Notes (Signed)
Subjective:    Patient ID: Harold Schwartz, male    DOB: Jun 26, 1956, 59 y.o.   MRN: [id number]  HPI  Pt presents to the clinic today to follow up his low testosterone. He has been on Androgel but reports his insurance company will no longer cover it. He last had his testosterone levels checked 04/2015, and it was 539.11. He reports he is taking Androgel because of a botched vasectomy causing low testosterone levels. He has noticed an increased in mood with it as well. He understands the risk of prostate cancer, heart attack and stroke.  Review of Systems      Past Medical History  Diagnosis Date  . Depression   . Heart murmur   . Colon polyps   . Genital herpes   . Atrial flutter ([place])   . Hyperlipidemia   . Hypogonadism in male   . Hemorrhoids     Current Outpatient Prescriptions  Medication Sig Dispense Refill  . aspirin EC 81 MG tablet Take 1 tablet (81 mg total) by mouth daily. 30 tablet 0  . ezetimibe (ZETIA) 10 MG tablet Take 1 tablet (10 mg total) by mouth daily. 30 tablet 11  . glucosamine-chondroitin 500-400 MG tablet Take 1 tablet by mouth 2 (two) times daily.    . hydrocortisone-pramoxine (PROCTOFOAM-HC) rectal foam Place 1 applicator rectally 2 (two) times daily. 10 g 0  . lidocaine (XYLOCAINE) 5 % ointment Apply 1 application topically as needed. 35.44 g 0  . metoprolol succinate (TOPROL-XL) 25 MG 24 hr tablet Take 1 tablet (25 mg total) by mouth 2 (two) times daily. 60 tablet 11  . NON FORMULARY 2 capsules daily. Celery seed extract    . Omega-3 Fatty Acids (FISH OIL) 1200 MG CAPS Take 5 capsules by mouth daily.    . pramoxine (PROCTOFOAM) 1 % foam Place 1 application rectally 3 (three) times daily as needed for itching. 15 g 0  . ranitidine (ZANTAC) 150 MG tablet Take 1 tablet (150 mg total) by mouth 2 (two) times daily. 60 tablet 1  . Testosterone (ANDROGEL) 20.25 MG/1.25GM (1.62%) GEL Place 4 application onto the skin daily. 1.25 g 5  . valACYclovir (VALTREX) 1000  MG tablet Take 1 tablet (1,000 mg total) by mouth daily. 90 tablet 3   No current facility-administered medications for this visit.    No Known Allergies  Family History  Problem Relation Age of Onset  . Breast cancer Mother   . Heart disease Mother   . Hyperlipidemia Mother   . Hypertension Mother   . Colon cancer Father   . Heart disease Father   . Alzheimer's disease Father   . Hyperlipidemia Father   . Hyperlipidemia Brother     Social History   Social History  . Marital Status: Married    Spouse Name: N/A  . Number of Children: 2  . Years of Education: N/A   Occupational History  . Engineer, site    Social History Main Topics  . Smoking status: Never Smoker   . Smokeless tobacco: Never Used  . Alcohol Use: 8.4 oz/week    14 Shots of liquor per week     Comment: moderate  . Drug Use: No  . Sexual Activity: Not on file   Other Topics Concern  . Not on file   Social History Narrative     Constitutional: Denies fever, malaise, fatigue, headache or abrupt weight changes.  Respiratory: Denies difficulty breathing, shortness of breath, cough or sputum production.  Cardiovascular: Denies chest pain, chest tightness, palpitations or swelling in the hands or feet.  Gastrointestinal: Denies abdominal pain, bloating, constipation, diarrhea or blood in the stool.  GU: Denies urgency, frequency, pain with urination, burning sensation, blood in urine, odor or discharge.   No other specific complaints in a complete review of systems (except as listed in HPI above).  Objective:   Physical Exam   BP 126/78 mmHg  Pulse 65  Temp(Src) 98.1 F (36.7 C) (Oral)  Wt 191 lb (86.637 kg)  Wt Readings from Last 3 Encounters:  12/05/15 191 lb (86.637 kg)  12/04/15 188 lb (85.276 kg)  09/27/15 193 lb 8 oz (87.771 kg)    General: Appears his stated age, well developed, well nourished in NAD. Cardiovascular: Normal rate and rhythm. S1,S2 noted.  No murmur, rubs or  gallops noted.  Pulmonary/Chest: Normal effort and positive vesicular breath sounds. No respiratory distress. No wheezes, rales or ronchi noted.  Neurological: Alert and oriented.   BMET    Component Value Date/Time   NA 139 12/04/2015 0824   K 4.1 12/04/2015 0824   CL 109 12/04/2015 0824   CO2 27 12/04/2015 0824   GLUCOSE 100* 12/04/2015 0824   BUN 20 12/04/2015 0824   CREATININE 0.98 12/04/2015 0824   CALCIUM 8.8* 12/04/2015 0824   GFRNONAA >60 12/04/2015 0824   GFRAA >60 12/04/2015 0824    Lipid Panel     Component Value Date/Time   CHOL 169 04/18/2015 0827   TRIG 66.0 04/18/2015 0827   HDL 58.60 04/18/2015 0827   CHOLHDL 3 04/18/2015 0827   VLDL 13.2 04/18/2015 0827   LDLCALC 97 04/18/2015 0827    CBC    Component Value Date/Time   WBC 4.7 12/04/2015 0824   RBC 4.91 12/04/2015 0824   HGB 14.8 12/04/2015 0824   HCT 44.5 12/04/2015 0824   PLT 115* 12/04/2015 0824   MCV 90.7 12/04/2015 0824   MCH 30.2 12/04/2015 0824   MCHC 33.3 12/04/2015 0824   RDW 14.2 12/04/2015 0824   LYMPHSABS 2.5 02/18/2015 1058   MONOABS 0.5 02/18/2015 1058   EOSABS 0.2 02/18/2015 1058   BASOSABS 0.0 02/18/2015 1058    Hgb A1C No results found for: HGBA1C      Assessment & Plan:

## 2015-12-05 NOTE — Progress Notes (Signed)
Pre visit review using our clinic review tool, if applicable. No additional management support is needed unless otherwise documented below in the visit note. 

## 2015-12-05 NOTE — Assessment & Plan Note (Signed)
Will switch Androgel to Axiron Recheck testosterone level in 1 month

## 2015-12-05 NOTE — Patient Instructions (Signed)
Testosterone skin gel What is this medicine? TESTOSTERONE (tes TOS ter one) is the main male hormone. It supports normal male traits such as muscle growth, facial hair, and deep voice. This gel is used in males to treat low testosterone levels. This medicine may be used for other purposes; ask your health care provider or pharmacist if you have questions. What should I tell my health care provider before I take this medicine? They need to know if you have any of these conditions: -breast cancer -diabetes -heart disease -if a male partner is pregnant or trying to get pregnant -kidney disease -liver disease -lung disease -prostate cancer, enlargement -an unusual or allergic reaction to testosterone, soy proteins, other medicines, foods, dyes, or preservatives -pregnant or trying to get pregnant -breast-feeding How should I use this medicine? This medicine is for external use only. This medicine is applied at the same time every day (preferably in the morning) to clean, dry, intact skin. If you take a bath or shower in the morning, apply the gel after the bath or shower. Follow the directions on the prescription label. Make sure that you are using your testosterone gel product correctly and applying it only to the appropriate skin area (see below). Allow the skin to dry a few minutes then cover with clothing to prevent others from coming in contact with the medicine on your skin. The gel is flammable. Avoid fire, flame, or smoking until the gel has dried. Wash your hands with soap and water after use. For AndroGel 1% Packets: Open the packet(s) needed for your dose. You can put the entire dose into your palm all at once or just a little at a time to apply. If you prefer, you can instead squeeze the gel directly onto the area you are applying it to. Apply on the shoulders, upper arm, or abdomen as directed. Do not apply to the scrotum or genitals. Be sure you use the correct total dose. It is best  to wait 5 to 6 hours after application of the gel before showering or swimming. For AndroGel 1%: Pump the dose into the palm of your hand. You can put the entire dose into your palm all at once or just a little at a time to apply. If you prefer, you can instead pump the gel directly onto the area you are applying it to. Apply on the shoulders, upper arm, or abdomen as directed. Do not apply to the scrotum or genitals. Be sure you use the correct total dose. It is best to wait for 5 to 6 hours after application of the gel before showering or swimming. For Androgel 1.62% packets: Open the packet(s) needed for your dose. You can put the entire dose into your palm all at once or just a little at a time to apply. If you prefer, you can instead squeeze the gel directly onto the area you are applying it to. Apply on the shoulders and upper arms as directed. Do not apply to other parts of the body including the abdomen, genitals, chest, armpits, or knees. Be sure you use the correct total dose. It is best to wait 2 hours after application of the gel before washing, showering, or swimming. For AndroGel 1.62%: Pump the dose into the palm of your hand. Dispense one pump of gel at a time into the palm of your hand before applying it. If you prefer, you can instead pump the gel directly onto the area you are applying it to. Apply on  the shoulders and upper arms as directed. Do not apply to other parts of the body including the abdomen, genitals, chest, armpits, or knees. Be sure you use the correct total dose. It is best to wait 2 hours after application of the gel before washing, showering, or swimming. For Testim: Open the tube(s) needed for your dose. Squeeze the gel from the tube into the palm of your hand. Apply on the shoulders or upper arms as directed. Do not apply to the scrotum, genitals, or abdomen. Be sure you use the correct total dose. Do not shower or swim for at least 2 hours after application of the  gel. For Fortesta: Use the multi-dose pump to pump the gel directly onto the area you are applying it to. Apply on the thighs as directed. Do not apply to the abdomen, penis, scrotum, shoulders or upper arms. Gently rub the gel onto the skin using your finger. Be sure you use the correct total dose. Do not shower or swim for at least 2 hours after application of the gel. A special MedGuide will be given to you by the pharmacist with each prescription and refill. Be sure to read this information carefully each time. Talk to your pediatrician regarding the use of this medicine in children. Special care may be needed. Overdosage: If you think you have taken too much of this medicine contact a poison control center or emergency room at once. NOTE: This medicine is only for you. Do not share this medicine with others. What if I miss a dose? If you miss a dose, use it as soon as you can. If it is almost time for your next dose, use only that dose. Do not use double or extra doses. What may interact with this medicine? -medicines for diabetes -medicines that treat or prevent blood clots like warfarin -oxyphenbutazone -propranolol -steroid medicines like prednisone or cortisone This list may not describe all possible interactions. Give your health care provider a list of all the medicines, herbs, non-prescription drugs, or dietary supplements you use. Also tell them if you smoke, drink alcohol, or use illegal drugs. Some items may interact with your medicine. What should I watch for while using this medicine? Visit your doctor or health care professional for regular checks on your progress. They will need to check the level of testosterone in your blood. This medicine is only approved for use in men who have low levels of testosterone related to certain medical conditions. Heart attacks and strokes have been reported with the use of this medicine. Notify your doctor or health care professional and seek  emergency treatment if you develop breathing problems; changes in vision; confusion; chest pain or chest tightness; sudden arm pain; severe, sudden headache; trouble speaking or understanding; sudden numbness or weakness of the face, arm or leg; loss of balance or coordination. Talk to your doctor about the risks and benefits of this medicine. This medicine can transfer from your body to others. If a person or pet comes in contact with the area where this medicine was applied to your skin, they may have a serious risk of side effects. If you cannot avoid skin-to-skin contact with another person, make sure the site where this medicine was applied is covered with clothing. If accidental contact happens, the skin of the person or pet should be washed right away with soap and water. Also, a male partner who is pregnant or trying to get pregnant should avoid contact with the gel or treated skin.  This medicine may affect blood sugar levels. If you have diabetes, check with your doctor or health care professional before you change your diet or the dose of your diabetic medicine. This drug is banned from use in athletes by most athletic organizations. What side effects may I notice from receiving this medicine? Side effects that you should report to your doctor or health care professional as soon as possible: -allergic reactions like skin rash, itching or hives, swelling of the face, lips, or tongue -breast enlargement -breathing problems -changes in mood, especially anger, depression, or rage -dark urine -general ill feeling or flu-like symptoms -light-colored stools -loss of appetite, nausea -nausea, vomiting -right upper belly pain -stomach pain -swelling of ankles -too frequent or persistent erections -trouble passing urine or change in the amount of urine -unusually weak or tired -yellowing of the eyes or skin Side effects that usually do not require medical attention (report to your doctor or  health care professional if they continue or are bothersome): -acne -change in sex drive or performance -hair loss -headache This list may not describe all possible side effects. Call your doctor for medical advice about side effects. You may report side effects to FDA at 1-800-FDA-1088. Where should I keep my medicine? Keep out of the reach of children. This medicine can be abused. Keep your medicine in a safe place to protect it from theft. Do not share this medicine with anyone. Selling or giving away this medicine is dangerous and against the law. Store at room temperature between 15 to 30 degrees C (59 to 86 degrees F). Keep closed until use. Protect from heat and light. This medicine is flammable. Avoid exposure to heat, fire, flame, and smoking. Throw away any unused medicine after the expiration date. NOTE: This sheet is a summary. It may not cover all possible information. If you have questions about this medicine, talk to your doctor, pharmacist, or health care provider.    2016, Elsevier/Gold Standard. (2014-02-18 08:27:26)

## 2015-12-06 ENCOUNTER — Encounter: Payer: Self-pay | Admitting: Internal Medicine

## 2015-12-06 MED ORDER — TESTOSTERONE 30 MG/ACT TD SOLN: TRANSDERMAL | Status: DC

## 2015-12-06 NOTE — Addendum Note (Signed)
Addended by: Lurlean Nanny on: 12/06/2015 04:07 PM   Modules accepted: Orders

## 2015-12-08 ENCOUNTER — Telehealth: Payer: Self-pay

## 2015-12-08 NOTE — Telephone Encounter (Signed)
PA has been submitted through [email or web address]--awaiting response 

## 2015-12-15 ENCOUNTER — Encounter: Payer: Self-pay | Admitting: Internal Medicine

## 2015-12-16 NOTE — Telephone Encounter (Signed)
Received fax from insurance denying Axiron because pt has not had at least 2 recent labs showing levels were low..last lab 04/2015 was within normal limits....please advise

## 2015-12-16 NOTE — Telephone Encounter (Signed)
PA was denied,received fax yesterday. The reason for denial stated that because there has not been more than 1 recent lab showing that both tests were low, medication could not be authorized.--please authorized--mychart msg sent to pt

## 2015-12-17 NOTE — Telephone Encounter (Signed)
Labs are not going to be low when he is on medication. Will his insurance not cover any testosterone replacement?

## 2015-12-20 ENCOUNTER — Other Ambulatory Visit: Payer: BC Managed Care – PPO

## 2015-12-20 NOTE — Telephone Encounter (Signed)
Pt schedule appt to have testosterone labs--appt moved from [place] schedule to lab schedule

## 2015-12-21 ENCOUNTER — Other Ambulatory Visit (INDEPENDENT_AMBULATORY_CARE_PROVIDER_SITE_OTHER): Payer: BC Managed Care – PPO

## 2015-12-21 ENCOUNTER — Ambulatory Visit: Payer: Self-pay | Admitting: Internal Medicine

## 2015-12-21 DIAGNOSIS — E291 Testicular hypofunction: Secondary | ICD-10-CM | POA: Diagnosis not present

## 2015-12-21 DIAGNOSIS — E349 Endocrine disorder, unspecified: Secondary | ICD-10-CM

## 2015-12-21 LAB — TESTOSTERONE: Testosterone: 261.23 ng/dL — ABNORMAL LOW (ref 300.00–890.00)

## 2016-01-01 ENCOUNTER — Encounter: Payer: Self-pay | Admitting: Internal Medicine

## 2016-01-01 DIAGNOSIS — E291 Testicular hypofunction: Secondary | ICD-10-CM

## 2016-01-01 DIAGNOSIS — E349 Endocrine disorder, unspecified: Secondary | ICD-10-CM

## 2016-01-05 ENCOUNTER — Other Ambulatory Visit: Payer: BC Managed Care – PPO

## 2016-01-13 ENCOUNTER — Other Ambulatory Visit (INDEPENDENT_AMBULATORY_CARE_PROVIDER_SITE_OTHER): Payer: BC Managed Care – PPO

## 2016-01-13 DIAGNOSIS — E291 Testicular hypofunction: Secondary | ICD-10-CM

## 2016-01-13 LAB — TESTOSTERONE: TESTOSTERONE: 197.41 ng/dL — AB (ref 300.00–890.00)

## 2016-01-23 NOTE — Telephone Encounter (Signed)
Pt wants to know what should he do. I asked him to call insurance and see if they offer alternatives and let me know as they did not specify that in the denial. Please advise

## 2016-01-27 ENCOUNTER — Encounter: Payer: Self-pay | Admitting: Internal Medicine

## 2016-02-01 NOTE — Telephone Encounter (Signed)
Appeal letter with recent Testosterone labs mailed to [facility] in [place] clinical review

## 2016-02-01 NOTE — Telephone Encounter (Signed)
Medication PA was resent and was denied

## 2016-02-14 ENCOUNTER — Encounter: Payer: Self-pay | Admitting: Internal Medicine

## 2016-02-15 NOTE — Telephone Encounter (Signed)
Spoke with Ani at [employer] and he stated the medication has been approved through 11/2016 and nothing additional needed from [place]. mychart message sent to pt notifying him of the approval

## 2016-03-02 ENCOUNTER — Telehealth: Payer: Self-pay

## 2016-03-02 NOTE — Telephone Encounter (Signed)
Received PA request from pharmacy for Metoprolol quantity limit as pt takes BID and insurance only wants to cover QD. PA submitted through [email or web address] awaiting response---will send pt mychart msg to let him know

## 2016-03-10 NOTE — Telephone Encounter (Signed)
Pt takes Metorprolol BID insurance will not cover to be taken 2 times daily only 1 time daily please advise

## 2016-03-12 NOTE — Telephone Encounter (Signed)
We can change to 50 mg XL daily if pt okay with that

## 2016-03-12 NOTE — Telephone Encounter (Signed)
Insurance will not cover BID as it was previously prescribed---what do you suggest? Change dose and/ instructions or change med?--please advise

## 2016-03-12 NOTE — Telephone Encounter (Signed)
See below

## 2016-05-03 ENCOUNTER — Other Ambulatory Visit: Payer: Self-pay | Admitting: Internal Medicine

## 2016-05-03 MED ORDER — TESTOSTERONE 30 MG/ACT TD SOLN: TRANSDERMAL | Status: DC

## 2016-05-03 NOTE — Addendum Note (Signed)
Addended by: Lurlean Nanny on: 05/03/2016 01:51 PM   Modules accepted: Orders

## 2016-05-03 NOTE — Telephone Encounter (Signed)
Rx printed and will fax once signed by [facility]

## 2016-05-03 NOTE — Telephone Encounter (Signed)
Last filled 12/16---last lab was 12/2015--please advise

## 2016-05-03 NOTE — Telephone Encounter (Signed)
Ok to phone in testosterone

## 2016-05-05 ENCOUNTER — Other Ambulatory Visit: Payer: Self-pay | Admitting: Internal Medicine

## 2016-05-13 ENCOUNTER — Telehealth: Payer: Self-pay | Admitting: Internal Medicine

## 2016-06-15 ENCOUNTER — Encounter: Payer: Self-pay | Admitting: Internal Medicine

## 2016-06-15 ENCOUNTER — Ambulatory Visit (INDEPENDENT_AMBULATORY_CARE_PROVIDER_SITE_OTHER): Payer: BC Managed Care – PPO | Admitting: Internal Medicine

## 2016-06-15 VITALS — BP 122/84 | HR 57 | Temp 98.8°F | Ht 71.0 in | Wt 210.0 lb

## 2016-06-15 DIAGNOSIS — E291 Testicular hypofunction: Secondary | ICD-10-CM | POA: Diagnosis not present

## 2016-06-15 DIAGNOSIS — Z125 Encounter for screening for malignant neoplasm of prostate: Secondary | ICD-10-CM

## 2016-06-15 DIAGNOSIS — Z114 Encounter for screening for human immunodeficiency virus [HIV]: Secondary | ICD-10-CM | POA: Diagnosis not present

## 2016-06-15 DIAGNOSIS — I4892 Unspecified atrial flutter: Secondary | ICD-10-CM | POA: Diagnosis not present

## 2016-06-15 DIAGNOSIS — Z0000 Encounter for general adult medical examination without abnormal findings: Secondary | ICD-10-CM | POA: Diagnosis not present

## 2016-06-15 DIAGNOSIS — Z1159 Encounter for screening for other viral diseases: Secondary | ICD-10-CM

## 2016-06-15 DIAGNOSIS — R7989 Other specified abnormal findings of blood chemistry: Secondary | ICD-10-CM

## 2016-06-15 DIAGNOSIS — A6000 Herpesviral infection of urogenital system, unspecified: Secondary | ICD-10-CM

## 2016-06-15 DIAGNOSIS — E781 Pure hyperglyceridemia: Secondary | ICD-10-CM

## 2016-06-15 DIAGNOSIS — K649 Unspecified hemorrhoids: Secondary | ICD-10-CM

## 2016-06-15 DIAGNOSIS — R609 Edema, unspecified: Secondary | ICD-10-CM

## 2016-06-15 MED ORDER — HYDROCHLOROTHIAZIDE 12.5 MG PO CAPS: 12.5000 mg | ORAL_CAPSULE | Freq: Every day | ORAL | Status: DC | PRN

## 2016-06-15 MED ORDER — EZETIMIBE 10 MG PO TABS: 10.0000 mg | ORAL_TABLET | Freq: Every day | ORAL | Status: DC

## 2016-06-15 MED ORDER — TESTOSTERONE 30 MG/ACT TD SOLN: TRANSDERMAL | Status: DC

## 2016-06-15 MED ORDER — METOPROLOL SUCCINATE ER 25 MG PO TB24: ORAL_TABLET | ORAL | Status: DC

## 2016-06-15 NOTE — Assessment & Plan Note (Signed)
Testosterone level today Continue Testosterone replacement

## 2016-06-15 NOTE — Assessment & Plan Note (Signed)
-

## 2016-06-15 NOTE — Assessment & Plan Note (Signed)
Stay well hydrated Continue Toprol and ASA

## 2016-06-15 NOTE — Assessment & Plan Note (Signed)
Continue Valtrex 

## 2016-06-15 NOTE — Progress Notes (Signed)
Subjective:    Patient ID: Harold Schwartz, male    DOB: [birth year], 60 y.o.   MRN: [id number]  HPI  Pt presents to the clinic today for his annual exam. He is also due for follow up of chronic medical conditions.  Flu: 08/2015 Tetanus: 04/2015 Pneumovax: 01/18/2012 Zostovax: 04/2015 PSA Screening: yearly, 04/18/2015 Colon Screening: 2013 at [facility] in [place] (10 years) Vision Screening: > 2 year ago Dentist: biannually   External Hemorrhoids: He is keep this at [place] with Anusol cream.  Hypertriglyceridemia: Triglycerides at goal on Zetia and Fish Oil. He does consume a low fat diet.  Paroxysmal Afib: Triggered by dehydration. No issues on Toprol and ASA. He denies chest pain or shortness of breath. ECG from 11/2015 reviewed.  Hypotestosteronism: Testosterone last checked 6 months ago. Using Androgel daily. He denies side effects from the medication.  Genital Herpes: No recent outbreaks on Valtrex.  Diet: He does eat meat. He does eat lots of fruits and veggies. He tries to avoid fried foods. He drinks mostly water. Exercise: He is walking every day. He is doing some weight lifting.  Review of Systems      Past Medical History  Diagnosis Date  . Depression   . Heart murmur   . Colon polyps   . Genital herpes   . Atrial flutter ([place])   . Hyperlipidemia   . Hypogonadism in male   . Hemorrhoids     Current Outpatient Prescriptions  Medication Sig Dispense Refill  . aspirin EC 81 MG tablet Take 1 tablet (81 mg total) by mouth daily. 30 tablet 0  . ezetimibe (ZETIA) 10 MG tablet Take 1 tablet (10 mg total) by mouth daily. SCHEDULE ANNUAL PHYSICAL 30 tablet 1  . glucosamine-chondroitin 500-400 MG tablet Take 1 tablet by mouth 2 (two) times daily.    . hydrocortisone-pramoxine (PROCTOFOAM-HC) rectal foam Place 1 applicator rectally 2 (two) times daily. 10 g 0  . lidocaine (XYLOCAINE) 5 % ointment Apply 1 application topically as needed. 35.44 g 0  . metoprolol  succinate (TOPROL-XL) 25 MG 24 hr tablet TAKE 1 TABLET (25 MG TOTAL) BY MOUTH 2 (TWO) TIMES DAILY. 60 tablet 1  . NON FORMULARY 2 capsules daily. Celery seed extract    . Omega-3 Fatty Acids (FISH OIL) 1200 MG CAPS Take 5 capsules by mouth daily.    . pramoxine (PROCTOFOAM) 1 % foam Place 1 application rectally 3 (three) times daily as needed for itching. 15 g 0  . ranitidine (ZANTAC) 150 MG tablet Take 1 tablet (150 mg total) by mouth 2 (two) times daily. 60 tablet 1  . Testosterone (AXIRON) 30 MG/ACT SOLN Place 1 pump under each axilla (armpit) daily 90 mL 1  . valACYclovir (VALTREX) 1000 MG tablet Take 1 tablet (1,000 mg total) by mouth daily. 90 tablet 3   No current facility-administered medications for this visit.    No Known Allergies  Family History  Problem Relation Age of Onset  . Breast cancer Mother   . Heart disease Mother   . Hyperlipidemia Mother   . Hypertension Mother   . Colon cancer Father   . Heart disease Father   . Alzheimer's disease Father   . Hyperlipidemia Father   . Hyperlipidemia Brother     Social History   Social History  . Marital Status: Married    Spouse Name: N/A  . Number of Children: 2  . Years of Education: N/A   Occupational History  . Engineer, site  Social History Main Topics  . Smoking status: Never Smoker   . Smokeless tobacco: Never Used  . Alcohol Use: 8.4 oz/week    14 Shots of liquor per week     Comment: moderate  . Drug Use: No  . Sexual Activity: Not on file   Other Topics Concern  . Not on file   Social History Narrative     Constitutional: Denies fever, malaise, fatigue, headache or abrupt weight changes.  HEENT: Denies eye pain, eye redness, ear pain, ringing in the ears, wax buildup, runny nose, nasal congestion, bloody nose, or sore throat. Respiratory: Denies difficulty breathing, shortness of breath, cough or sputum production.   Cardiovascular: Pt reports swelling in left leg. Denies chest pain,  chest tightness, palpitations or swelling in the hands.  Gastrointestinal: Pt reports intermittent constipation. Denies abdominal pain, bloating, diarrhea or blood in the stool.  GU: Denies urgency, frequency, pain with urination, burning sensation, blood in urine, odor or discharge. Musculoskeletal: Denies decrease in range of motion, difficulty with gait, or joint pain and swelling.  Skin: Denies redness, rashes, lesions or ulcercations.  Neurological: Denies dizziness, difficulty with memory, difficulty with speech or problems with balance and coordination.   No other specific complaints in a complete review of systems (except as listed in HPI above).  Objective:   Physical Exam   There were no vitals taken for this visit. Wt Readings from Last 3 Encounters:  12/05/15 191 lb (86.637 kg)  12/04/15 188 lb (85.276 kg)  09/27/15 193 lb 8 oz (87.771 kg)    General: Appears his stated age, well developed, well nourished in NAD. Skin: Warm, dry and intact.  HEENT: Head: normal shape and size; Eyes: sclera white, no icterus, conjunctiva pink, PERRLA and EOMs intact; Ears: Tm's gray and intact, normal light reflex; Throat/Mouth: Teeth present, mucosa pink and moist, no exudate, lesions or ulcerations noted.  Neck: Neck supple, trachea midline. No massses, lumps or thyromegaly present.  Cardiovascular: Normal rate and rhythm. S1,S2 noted.  No murmur, rubs or gallops noted. 1+ pitting LLE edema. No carotid bruits noted. Pulmonary/Chest: Normal effort and positive vesicular breath sounds. No respiratory distress. No wheezes, rales or ronchi noted.  Abdomen: Soft and nontender. Normal bowel sounds. No distention or masses noted. Liver, spleen and kidneys non palpable. Musculoskeletal:  Strength 5/5 BUE/BLE. No difficulty with gait.  Neurological: Alert and oriented. Cranial nerves II-XII grossly intact. Coordination normal.  Psychiatric: Mood and affect normal. Behavior is normal. Judgment and  thought content normal.     BMET    Component Value Date/Time   NA 139 12/04/2015 0824   K 4.1 12/04/2015 0824   CL 109 12/04/2015 0824   CO2 27 12/04/2015 0824   GLUCOSE 100* 12/04/2015 0824   BUN 20 12/04/2015 0824   CREATININE 0.98 12/04/2015 0824   CALCIUM 8.8* 12/04/2015 0824   GFRNONAA >60 12/04/2015 0824   GFRAA >60 12/04/2015 0824    Lipid Panel     Component Value Date/Time   CHOL 169 04/18/2015 0827   TRIG 66.0 04/18/2015 0827   HDL 58.60 04/18/2015 0827   CHOLHDL 3 04/18/2015 0827   VLDL 13.2 04/18/2015 0827   LDLCALC 97 04/18/2015 0827    CBC    Component Value Date/Time   WBC 4.7 12/04/2015 0824   RBC 4.91 12/04/2015 0824   HGB 14.8 12/04/2015 0824   HCT 44.5 12/04/2015 0824   PLT 115* 12/04/2015 0824   MCV 90.7 12/04/2015 0824   MCH 30.2  12/04/2015 0824   MCHC 33.3 12/04/2015 0824   RDW 14.2 12/04/2015 0824   LYMPHSABS 2.5 02/18/2015 1058   MONOABS 0.5 02/18/2015 1058   EOSABS 0.2 02/18/2015 1058   BASOSABS 0.0 02/18/2015 1058    Hgb A1C No results found for: HGBA1C      Assessment & Plan:   Preventative Health Maintenance:  Encouraged him to get a flu shot in the fall Tdap, Pneumovax and Zostovax UTD PSA today with screening labs Colonoscopy due 2023 Advised him to consume a balanced diet and exercise regimen Encouraged him to visit an eye doctor and dentist at least once yearly  Will check CBC, CMET, Lipid, Testosterone, PSA, HIV and Hep C today  Edema:  eRx for HCTZ daily prn Continue compression hose RTC in 1 year for annual exam

## 2016-06-15 NOTE — Assessment & Plan Note (Signed)
CMET and Lipid profile today Encouraged him to consume a low fat diet Continue Zetia 

## 2016-06-15 NOTE — Patient Instructions (Signed)

## 2016-06-15 NOTE — Progress Notes (Signed)
Pre visit review using our clinic review tool, if applicable. No additional management support is needed unless otherwise documented below in the visit note. 

## 2016-06-28 ENCOUNTER — Encounter: Payer: Self-pay | Admitting: Family Medicine

## 2016-06-29 ENCOUNTER — Other Ambulatory Visit (INDEPENDENT_AMBULATORY_CARE_PROVIDER_SITE_OTHER): Payer: BC Managed Care – PPO

## 2016-06-29 DIAGNOSIS — Z0000 Encounter for general adult medical examination without abnormal findings: Secondary | ICD-10-CM

## 2016-06-29 DIAGNOSIS — Z1159 Encounter for screening for other viral diseases: Secondary | ICD-10-CM

## 2016-06-29 DIAGNOSIS — E291 Testicular hypofunction: Secondary | ICD-10-CM

## 2016-06-29 DIAGNOSIS — Z125 Encounter for screening for malignant neoplasm of prostate: Secondary | ICD-10-CM

## 2016-06-29 DIAGNOSIS — Z114 Encounter for screening for human immunodeficiency virus [HIV]: Secondary | ICD-10-CM

## 2016-06-29 DIAGNOSIS — R7989 Other specified abnormal findings of blood chemistry: Secondary | ICD-10-CM

## 2016-06-29 LAB — LIPID PANEL
CHOL/HDL RATIO: 3
Cholesterol: 176 mg/dL (ref 0–200)
HDL: 65 mg/dL (ref >39.00)
LDL Cholesterol: 98 mg/dL (ref 0–99)
NonHDL: 111.49
TRIGLYCERIDES: 66 mg/dL (ref 0.0–149.0)
VLDL: 13.2 mg/dL (ref 0.0–40.0)

## 2016-06-29 LAB — COMPREHENSIVE METABOLIC PANEL
ALT: 15 U/L (ref 0–53)
AST: 17 U/L (ref 0–37)
Albumin: 4.3 g/dL (ref 3.5–5.2)
Alkaline Phosphatase: 49 U/L (ref 39–117)
BILIRUBIN TOTAL: 0.7 mg/dL (ref 0.2–1.2)
BUN: 20 mg/dL (ref 6–23)
CALCIUM: 9.4 mg/dL (ref 8.4–10.5)
CO2: 29 meq/L (ref 19–32)
CREATININE: 1.09 mg/dL (ref 0.40–1.50)
Chloride: 102 mEq/L (ref 96–112)
GFR: 72.99 mL/min (ref >60.00)
Glucose, Bld: 87 mg/dL (ref 70–99)
Potassium: 4.7 mEq/L (ref 3.5–5.1)
Sodium: 139 mEq/L (ref 135–145)
Total Protein: 7.3 g/dL (ref 6.0–8.3)

## 2016-06-29 LAB — CBC
HEMATOCRIT: 44.3 % (ref 39.0–52.0)
HEMOGLOBIN: 15.1 g/dL (ref 13.0–17.0)
MCHC: 34.1 g/dL (ref 30.0–36.0)
MCV: 89.3 fl (ref 78.0–100.0)
PLATELETS: 141 10*3/uL — AB (ref 150.0–400.0)
RBC: 4.96 Mil/uL (ref 4.22–5.81)
RDW: 13 % (ref 11.5–15.5)
WBC: 4.7 10*3/uL (ref 4.0–10.5)

## 2016-06-29 LAB — TESTOSTERONE: TESTOSTERONE: 304.29 ng/dL (ref 300.00–890.00)

## 2016-06-29 LAB — PSA: PSA: 3.47 ng/mL (ref 0.10–4.00)

## 2016-06-30 LAB — HEPATITIS C ANTIBODY: HCV Ab: NEGATIVE

## 2016-06-30 LAB — HIV ANTIBODY (ROUTINE TESTING W REFLEX): HIV: NONREACTIVE

## 2016-07-05 ENCOUNTER — Encounter: Payer: Self-pay | Admitting: Internal Medicine

## 2016-07-05 DIAGNOSIS — I480 Paroxysmal atrial fibrillation: Secondary | ICD-10-CM

## 2016-07-14 ENCOUNTER — Other Ambulatory Visit: Payer: Self-pay | Admitting: Internal Medicine

## 2016-07-14 DIAGNOSIS — E291 Testicular hypofunction: Secondary | ICD-10-CM

## 2016-07-26 ENCOUNTER — Ambulatory Visit (INDEPENDENT_AMBULATORY_CARE_PROVIDER_SITE_OTHER): Payer: BC Managed Care – PPO | Admitting: Cardiology

## 2016-07-26 ENCOUNTER — Encounter: Payer: Self-pay | Admitting: Cardiology

## 2016-07-26 VITALS — BP 112/72 | HR 65 | Ht 71.0 in | Wt 211.5 lb

## 2016-07-26 DIAGNOSIS — I4892 Unspecified atrial flutter: Secondary | ICD-10-CM | POA: Diagnosis not present

## 2016-07-26 MED ORDER — METOPROLOL TARTRATE 25 MG PO TABS: 25.0000 mg | ORAL_TABLET | Freq: Every day | ORAL | 6 refills | Status: DC | PRN

## 2016-07-26 NOTE — Addendum Note (Signed)
Addended by: Valora Corporal on: 07/26/2016 02:22 PM   Modules accepted: Orders

## 2016-07-26 NOTE — Progress Notes (Signed)
Cardiology Office Note   Date:  07/26/2016   ID:  Harold Schwartz, DOB 09/13/1956, MRN [id number]  Referring Doctor:  Webb Silversmith, NP   Cardiologist:   Wende Bushy, MD   Reason for consultation:  Chief Complaint  Patient presents with  . Other    Ref by Dr. Garnette Gunner for paroxysmal a-fib. Meds reviewed by the patient verbally. Pt. c/o elevated heart beats with left leg swelling.       History of Present Illness: Harold Schwartz is a 60 y.o. male who presents for Establishing care for atrial flutter history.  Patient reports that 5 years ago, he woke up in the middle of the night with rapid heart rate, chest pain, shortness of breath. 911 was called and he was sent to the ER. He was then diagnosed with atrial flutter. He stayed in the hospital for approximately 2 days. He eventually converted back to normal rhythm apparently. He did not receive any cardioversion. He followed up with a cardiologist at that time and underwent stress testing as an outpatient. He was told to take an extra dose of the metoprolol that he was started on for any episodes of tachycardia. He was started on aspirin 325 mg once a day. This was recently decreased to 81 mg a day due to low platelet counts.  Probably a month ago, he had swelling of the left leg after prolonged traveling. He is obese to be started him on HCTZ. After 2 days, he noted onset of feeling his heart racing. With his wrist monitor/fitbit, he noted that his heart was in the 140s. Although he had no shortness of breath and chest pain, he felt that he had an episode of his arrhythmia. Since he had no chest pain, he decided not to go to the hospital and took her next dose of his metoprolol. He had on and off bursts of the rapid heart rate but eventually it subsided after 2 days.  Approximately 3 weeks ago, he had another episode of fast heartbeat. Since the recurrence of his arrhythmia, he wanted to be seen by cardiology again.  No chest pain,  shortness breath, PND, orthopnea, edema. No loss of consciousness.  He admits to heavy drinking at times. He would have at least 3 servings of tea a day. No snoring.  ROS:  Please see the history of present illness. Aside from mentioned under HPI, all other systems are reviewed and negative.     Past Medical History:  Diagnosis Date  . Atrial flutter ([place])   . Colon polyps   . Depression   . Genital herpes   . Heart murmur   . Hemorrhoids   . Hyperlipidemia   . Hypogonadism in male     Past Surgical History:  Procedure Laterality Date  . KNEE ARTHROSCOPY    . VASECTOMY  1990     reports that he has never smoked. He has never used smokeless tobacco. He reports that he drinks about 8.4 oz of alcohol per week . He reports that he does not use drugs.   family history includes Alzheimer's disease in his father; Breast cancer in his mother; Colon cancer in his father; Heart disease in his father and mother; Hyperlipidemia in his brother, father, and mother; Hypertension in his mother.  History of atrial fibrillation in family  Outpatient Medications Prior to Visit  Medication Sig Dispense Refill  . aspirin EC 81 MG tablet Take 1 tablet (81 mg total) by mouth daily. 30 tablet 0  .  AXIRON 30 MG/ACT SOLN PLACE 1 PUMP UNDER EACH ARMPIT DAILY 270 mL 3  . ezetimibe (ZETIA) 10 MG tablet Take 1 tablet (10 mg total) by mouth daily. 90 tablet 1  . glucosamine-chondroitin 500-400 MG tablet Take 1 tablet by mouth 2 (two) times daily.    . hydrochlorothiazide (MICROZIDE) 12.5 MG capsule Take 1 capsule (12.5 mg total) by mouth daily as needed. 30 capsule 1  . hydrocortisone-pramoxine (PROCTOFOAM-HC) rectal foam Place 1 applicator rectally 2 (two) times daily. 10 g 0  . lidocaine (XYLOCAINE) 5 % ointment Apply 1 application topically as needed. 35.44 g 0  . metoprolol succinate (TOPROL-XL) 25 MG 24 hr tablet TAKE 1 TABLET (25 MG TOTAL) BY MOUTH 2 (TWO) TIMES DAILY. 120 tablet 1  . NON FORMULARY  2 capsules daily. Celery seed extract    . Omega-3 Fatty Acids (FISH OIL) 1200 MG CAPS Take 5 capsules by mouth daily.    . pramoxine (PROCTOFOAM) 1 % foam Place 1 application rectally 3 (three) times daily as needed for itching. 15 g 0  . valACYclovir (VALTREX) 1000 MG tablet Take 1 tablet (1,000 mg total) by mouth daily. 90 tablet 3   No facility-administered medications prior to visit.      Allergies: Review of patient's allergies indicates no known allergies.    PHYSICAL EXAM: VS:  BP 112/72 (BP Location: Left Arm, Patient Position: Sitting, Cuff Size: Normal)   Pulse 65   Ht 5\' 11"  (1.803 m)   Wt 211 lb 8 oz (95.9 kg)   BMI 29.50 kg/m  , Body mass index is 29.5 kg/m. Wt Readings from Last 3 Encounters:  07/26/16 211 lb 8 oz (95.9 kg)  06/15/16 210 lb (95.3 kg)  12/05/15 191 lb (86.6 kg)    GENERAL:  well developed, well nourished, not in acute distress HEENT: normocephalic, pink conjunctivae, anicteric sclerae, no xanthelasma, normal dentition, oropharynx clear NECK:  no neck vein engorgement, JVP normal, no hepatojugular reflux, carotid upstroke brisk and symmetric, no bruit, no thyromegaly, no lymphadenopathy LUNGS:  good respiratory effort, clear to auscultation bilaterally CV:  PMI not displaced, no thrills, no lifts, S1 and S2 within normal limits, no palpable S3 or S4, no murmurs, no rubs, no gallops ABD:  Soft, nontender, nondistended, normoactive bowel sounds, no abdominal aortic bruit, no hepatomegaly, no splenomegaly MS: nontender back, no kyphosis, no scoliosis, no joint deformities EXT:  2+ DP/PT pulses, no edema, no varicosities, no cyanosis, no clubbing SKIN: warm, nondiaphoretic, normal turgor, no ulcers NEUROPSYCH: alert, oriented to person, place, and time, sensory/motor grossly intact, normal mood, appropriate affect  Recent Labs: 06/29/2016: ALT 15; BUN 20; Creatinine, Ser 1.09; Hemoglobin 15.1; Platelets 141.0; Potassium 4.7; Sodium 139   Lipid Panel      Component Value Date/Time   CHOL 176 06/29/2016 0819   TRIG 66.0 06/29/2016 0819   HDL 65.00 06/29/2016 0819   CHOLHDL 3 06/29/2016 0819   VLDL 13.2 06/29/2016 0819   LDLCALC 98 06/29/2016 0819     Other studies Reviewed:  EKG:  The ekg from 07/26/2016 was personally reviewed by me and it revealed sinus rhythm, sinus arrhythmia, 65 BPM.  Additional studies/ records that were reviewed personally reviewed by me today include: None available   ASSESSMENT AND PLAN:  History of atrial flutter We will need to obtain records from previous cardiologist. Patient says he's had stress testing in the past. EKG currently shows sinus rhythm Had this diagnosis for 5 years and only recently noticed recurrence of possible  arrhythmia Recommend event monitor for further evaluation. Recommend echocardiogram as well. Continue Toprol-XL for now. CHADS2-VASc=0. Patient may continue to take aspirin at 81 minutes by mouth daily. PCP following platelet levels. Patient may take metoprolol 25 mg times one when necessary for fast heart rates, if he feels that he's having another episode of arrhythmia. We have discussed that if it turns out to be bouts of atrial flutter, we will recommend EP consultation. Recommend alcohol abstinence and limiting caffeine intake    Current medicines are reviewed at length with the patient today.  The patient does not have concerns regarding medicines.  Labs/ tests ordered today include:  Orders Placed This Encounter  Procedures  . Cardiac event monitor  . EKG 12-Lead    I had a lengthy and detailed discussion with the patient regarding diagnoses, prognosis, diagnostic options, treatment options, and side effects of medications.   I counseled the patient on importance of lifestyle modification including heart healthy diet, regular physical activity    Disposition:   FU with undersigned after tests   I spent at least 60 minutes with the patient today and more than  50% of the time was spent counseling the patient and coordinating care.    Signed, Wende Bushy, MD  07/26/2016 2:06 PM    [place]  This note was generated in part with voice recognition software and I apologize for any typographical errors that were not detected and corrected.

## 2016-07-26 NOTE — Patient Instructions (Addendum)
Medication Instructions:  Start metoprolol tartrate 25 mg as needed for episodes.  Testing/Procedures: Your physician has recommended that you wear an event monitor. Event monitors are medical devices that record the heart's electrical activity. Doctors most often [place] these monitors to diagnose arrhythmias. Arrhythmias are problems with the speed or rhythm of the heartbeat. The monitor is a small, portable device. You can wear one while you do your normal daily activities. This is usually used to diagnose what is causing palpitations/syncope (passing out).  Your physician has requested that you have an echocardiogram. Echocardiography is a painless test that uses sound waves to create images of your heart. It provides your doctor with information about the size and shape of your heart and how well your heart's chambers and valves are working. This procedure takes approximately one hour. There are no restrictions for this procedure.    Follow-Up: Your physician recommends that you schedule a follow-up appointment in: 6 weeks after your monitor has been done.  It was a pleasure seeing you today here in the office. Please do not hesitate to give [place] a call back if you have any further questions. [place], BSN

## 2016-07-30 ENCOUNTER — Other Ambulatory Visit: Payer: Self-pay

## 2016-07-30 ENCOUNTER — Ambulatory Visit (INDEPENDENT_AMBULATORY_CARE_PROVIDER_SITE_OTHER): Payer: BC Managed Care – PPO

## 2016-07-30 DIAGNOSIS — I4892 Unspecified atrial flutter: Secondary | ICD-10-CM | POA: Diagnosis not present

## 2016-08-02 ENCOUNTER — Encounter: Payer: Self-pay | Admitting: Cardiology

## 2016-08-02 ENCOUNTER — Ambulatory Visit (INDEPENDENT_AMBULATORY_CARE_PROVIDER_SITE_OTHER): Payer: BC Managed Care – PPO

## 2016-08-02 DIAGNOSIS — I4892 Unspecified atrial flutter: Secondary | ICD-10-CM | POA: Diagnosis not present

## 2016-08-27 ENCOUNTER — Other Ambulatory Visit: Payer: Self-pay | Admitting: Internal Medicine

## 2016-08-27 ENCOUNTER — Encounter: Payer: Self-pay | Admitting: Cardiology

## 2016-08-30 NOTE — Progress Notes (Signed)
Cardiology Office Note   Date:  09/03/2016   ID:  Harold Schwartz, DOB [birth year], MRN [id number]  Referring Doctor:  Webb Silversmith, NP   Cardiologist:   Wende Bushy, MD   Reason for consultation:  Chief Complaint  Patient presents with  . other    F/u echo and event monitor. Meds reviewed verbally with pt.      History of Present Illness: Harold Schwartz is a 60 y.o. male who presents for ffup after tests    Since last visit,he has noted onset of mild discomfort in the chest area, randomly occurring, sometimes with exertion. Lasting a few minutes at a time, resolving spontaneously. Associated with some shortness of breath.   No significant palpitations, PND, orthopnea, edema. No loss of consciousness.  He admits to heavy drinking at times. He would have at least 3 servings of tea a day. No snoring.  ROS:  Please see the history of present illness. Aside from mentioned under HPI, all other systems are reviewed and negative.     Past Medical History:  Diagnosis Date  . Atrial flutter ([place])   . Colon polyps   . Depression   . Genital herpes   . Heart murmur   . Hemorrhoids   . Hyperlipidemia   . Hypogonadism in male     Past Surgical History:  Procedure Laterality Date  . KNEE ARTHROSCOPY    . VASECTOMY  1990     reports that he has never smoked. He has never used smokeless tobacco. He reports that he does not drink alcohol or use drugs.   family history includes Alzheimer's disease in his father; Breast cancer in his mother; Colon cancer in his father; Heart disease in his father and mother; Hyperlipidemia in his brother, father, and mother; Hypertension in his mother.  History of atrial fibrillation in family  Outpatient Medications Prior to Visit  Medication Sig Dispense Refill  . aspirin EC 81 MG tablet Take 1 tablet (81 mg total) by mouth daily. 30 tablet 0  . AXIRON 30 MG/ACT SOLN PLACE 1 PUMP UNDER EACH ARMPIT DAILY 270 mL 3  . ezetimibe (ZETIA) 10 MG  tablet Take 1 tablet (10 mg total) by mouth daily. 90 tablet 1  . glucosamine-chondroitin 500-400 MG tablet Take 1 tablet by mouth 2 (two) times daily.    . hydrochlorothiazide (MICROZIDE) 12.5 MG capsule Take 1 capsule (12.5 mg total) by mouth daily as needed. 30 capsule 1  . hydrocortisone-pramoxine (PROCTOFOAM-HC) rectal foam Place 1 applicator rectally 2 (two) times daily. 10 g 0  . lidocaine (XYLOCAINE) 5 % ointment Apply 1 application topically as needed. 35.44 g 0  . metoprolol succinate (TOPROL-XL) 25 MG 24 hr tablet TAKE 1 TABLET (25 MG TOTAL) BY MOUTH 2 (TWO) TIMES DAILY. 180 tablet 1  . metoprolol tartrate (LOPRESSOR) 25 MG tablet Take 1 tablet (25 mg total) by mouth daily as needed. As needed for episode. 30 tablet 6  . NON FORMULARY 2 capsules daily. Celery seed extract    . Omega-3 Fatty Acids (FISH OIL) 1200 MG CAPS Take 5 capsules by mouth daily.    . pramoxine (PROCTOFOAM) 1 % foam Place 1 application rectally 3 (three) times daily as needed for itching. 15 g 0  . valACYclovir (VALTREX) 1000 MG tablet Take 1 tablet (1,000 mg total) by mouth daily. 90 tablet 3   No facility-administered medications prior to visit.      Allergies: Review of patient's allergies indicates no known allergies.  PHYSICAL EXAM: VS:  BP 108/78 (BP Location: Left Arm, Patient Position: Sitting, Cuff Size: Normal)   Pulse 60   Ht 5\' 11"  (1.803 m)   Wt 205 lb 12 oz (93.3 kg)   BMI 28.70 kg/m  , Body mass index is 28.7 kg/m. Wt Readings from Last 3 Encounters:  09/03/16 205 lb 12 oz (93.3 kg)  07/26/16 211 lb 8 oz (95.9 kg)  06/15/16 210 lb (95.3 kg)    GENERAL:  well developed, well nourished, not in acute distress HEENT: normocephalic, pink conjunctivae, anicteric sclerae, no xanthelasma, normal dentition, oropharynx clear NECK:  no neck vein engorgement, JVP normal, no hepatojugular reflux, carotid upstroke brisk and symmetric, no bruit, no thyromegaly, no lymphadenopathy LUNGS:  good  respiratory effort, clear to auscultation bilaterally CV:  PMI not displaced, no thrills, no lifts, S1 and S2 within normal limits, no palpable S3 or S4, no murmurs, no rubs, no gallops ABD:  Soft, nontender, nondistended, normoactive bowel sounds, no abdominal aortic bruit, no hepatomegaly, no splenomegaly MS: nontender back, no kyphosis, no scoliosis, no joint deformities EXT:  2+ DP/PT pulses, no edema, no varicosities, no cyanosis, no clubbing SKIN: warm, nondiaphoretic, normal turgor, no ulcers NEUROPSYCH: alert, oriented to person, place, and time, sensory/motor grossly intact, normal mood, appropriate affect  Recent Labs: 06/29/2016: ALT 15; BUN 20; Creatinine, Ser 1.09; Hemoglobin 15.1; Platelets 141.0; Potassium 4.7; Sodium 139   Lipid Panel    Component Value Date/Time   CHOL 176 06/29/2016 0819   TRIG 66.0 06/29/2016 0819   HDL 65.00 06/29/2016 0819   CHOLHDL 3 06/29/2016 0819   VLDL 13.2 06/29/2016 0819   LDLCALC 98 06/29/2016 0819     Other studies Reviewed:  EKG:  The ekg from 07/26/2016 was personally reviewed by me and it revealed sinus rhythm, sinus arrhythmia, 65 BPM.  Additional studies/ records that were reviewed personally reviewed by me today include:  Echo 07/30/2016:  Left ventricle: The cavity size was normal. Systolic function was   normal. The estimated ejection fraction was in the range of 60%   to 65%. Wall motion was normal; there were no regional wall   motion abnormalities. Left ventricular diastolic function   parameters were normal. - Mitral valve: There was mild regurgitation. - Left atrium: The atrium was normal in size. - Right ventricle: Systolic function was normal. - Pulmonary arteries: Systolic pressure was within the normal   range.  Impressions:  - Rhythm is normal sinus.  Event monitor 08/31/2016:  Monitoring period was 08/01/2016 2 08/30/2016.  Baseline samples was sinus rhythm, 59 BPM.  Automatically detected  events/asymptomatic events: Atrial flutter with variable conduction, PVCs, heart rate 90 BPM: 08/24/2016, at 11:40 PM Sinus rhythm with 7 beat run of PSVT, likely atrial tachycardia at 160 BPM: 08/12/2016, 4:49 PM  18 Manually detected events: Fluttering or skipped beats: Sinus rhythm with PVCs, sinus rhythm with PACs, sinus rhythm, sinus arrhythmia Chest pain: Sinus arrhythmia  ASSESSMENT AND PLAN:  History of atrial flutter,  Paroxysmal  noted 1 episode on event monitor, asymptomatic area this was at 1140 in the evening. Continue Toprol-XL for now. CHADS2-VASc=0. Patient may continue to take aspirin at 81 minutes by mouth daily. PCP following platelet levels. Patient may take metoprolol 25 mg times one when necessary for fast heart rates, if he feels that he's having another episode of arrhythmia.  Recommend sleep study to rule out OSA. Patient would like to think about this. We have discussed EP consultation,  Patient would like  to hold off on this for now. Recommend alcohol abstinence and limiting caffeine intake  CP  Atypical in nature. Recommend exercise nuclear stress test.  Current medicines are reviewed at length with the patient today.  The patient does not have concerns regarding medicines.  Labs/ tests ordered today include:  Orders Placed This Encounter  Procedures  . NM Myocar Multi W/Spect W/Wall Motion / EF    I had a lengthy and detailed discussion with the patient regarding diagnoses, prognosis, diagnostic options, treatment options, and side effects of medications.   I counseled the patient on importance of lifestyle modification including heart healthy diet, regular physical activity    Disposition:   FU with undersigned  In 6 months   I spent at least 60 minutes with the patient today and more than 50% of the time was spent counseling the patient and coordinating care.    Signed, Wende Bushy, MD  09/03/2016 9:09 AM    [place]  This note was generated in part with voice recognition software and I apologize for any typographical errors that were not detected and corrected.

## 2016-09-03 ENCOUNTER — Ambulatory Visit (INDEPENDENT_AMBULATORY_CARE_PROVIDER_SITE_OTHER): Payer: BC Managed Care – PPO | Admitting: Cardiology

## 2016-09-03 ENCOUNTER — Encounter: Payer: Self-pay | Admitting: Cardiology

## 2016-09-03 VITALS — BP 108/78 | HR 60 | Ht 71.0 in | Wt 205.8 lb

## 2016-09-03 DIAGNOSIS — I4892 Unspecified atrial flutter: Secondary | ICD-10-CM

## 2016-09-03 DIAGNOSIS — R0789 Other chest pain: Secondary | ICD-10-CM

## 2016-09-03 NOTE — Patient Instructions (Addendum)
Testing/Procedures: [place]  Your caregiver has ordered a Stress Test with nuclear imaging. The purpose of this test is to evaluate the blood supply to your heart muscle. This procedure is referred to as a "Non-Invasive Stress Test." This is because other than having an IV started in your vein, nothing is inserted or "invades" your body. Cardiac stress tests are done to find areas of poor blood flow to the heart by determining the extent of coronary artery disease (CAD). Some patients exercise on a treadmill, which naturally increases the blood flow to your heart, while others who are  unable to walk on a treadmill due to physical limitations have a pharmacologic/chemical stress agent called Lexiscan . This medicine will mimic walking on a treadmill by temporarily increasing your coronary blood flow.   Please note: these test may take anywhere between 2-4 hours to complete  PLEASE REPORT TO [facility] AT THE FIRST DESK WILL DIRECT YOU WHERE TO GO  Date of Procedure:_Friday September 21, 2016 at 08:00AM__  Arrival Time for Procedure:__Arrive at 07:45AM to register___  Instructions regarding medication:    _X___:  Hold metoprolol the night before procedure and morning of procedure.   PLEASE NOTIFY THE OFFICE AT LEAST 9 HOURS IN ADVANCE IF YOU ARE UNABLE TO KEEP YOUR APPOINTMENT.  [phone] AND  PLEASE NOTIFY NUCLEAR MEDICINE AT [facility] AT LEAST 24 HOURS IN ADVANCE IF YOU ARE UNABLE TO KEEP YOUR APPOINTMENT. [phone]  How to prepare for your Myoview test:  1. Do not eat or drink after midnight 2. No caffeine for 24 hours prior to test 3. No smoking 24 hours prior to test. 4. Your medication may be taken with water.  If your doctor stopped a medication because of this test, do not take that medication. 5. Ladies, please do not wear dresses.  Skirts or pants are appropriate. Please wear a short sleeve shirt. 6. No perfume, cologne or lotion. 7. Wear  comfortable walking shoes. No heels!   Follow-Up: Your physician wants you to follow-up in: 6 months with Dr. Yvone Neu. You will receive a reminder letter in the mail two months in advance. If you don't receive a letter, please call our office to schedule the follow-up appointment.  We will call you with test results.  It was a pleasure seeing you today here in the office. Please do not hesitate to give [place] a call back if you have any further questions. [place], BSN     Cardiac Nuclear Scanning A cardiac nuclear scan is used to check your heart for problems, such as the following:  A portion of the heart is not getting enough blood.  Part of the heart muscle has died, which happens with a heart attack.  The heart wall is not working normally.  In this test, a radioactive dye (tracer) is injected into your bloodstream. After the tracer has traveled to your heart, a scanning device is used to measure how much of the tracer is absorbed by or distributed to various areas of your heart. LET [facility] CARE PROVIDER KNOW ABOUT:  Any allergies you have.  All medicines you are taking, including vitamins, herbs, eye drops, creams, and over-the-counter medicines.  Previous problems you or members of your family have had with the use of anesthetics.  Any blood disorders you have.  Previous surgeries you have had.  Medical conditions you have.  RISKS AND COMPLICATIONS Generally, this is a safe procedure. However, as  with any procedure, problems can occur. Possible problems include:   Serious chest pain.  Rapid heartbeat.  Sensation of warmth in your chest. This usually passes quickly. BEFORE THE PROCEDURE Ask your health care provider about changing or stopping your regular medicines. PROCEDURE This procedure is usually done at a hospital and takes 2-4 hours.  An IV tube is inserted into one of your veins.  Your health care provider will inject a small amount  of radioactive tracer through the tube.  You will then wait for 20-40 minutes while the tracer travels through your bloodstream.  You will lie down on an exam table so images of your heart can be taken. Images will be taken for about 15-20 minutes.  You will exercise on a treadmill or stationary bike. While you exercise, your heart activity will be monitored with an electrocardiogram (ECG), and your blood pressure will be checked.  If you are unable to exercise, you may be given a medicine to make your heart beat faster.  When blood flow to your heart has peaked, tracer will again be injected through the IV tube.  After 20-40 minutes, you will get back on the exam table and have more images taken of your heart.  When the procedure is over, your IV tube will be removed. AFTER THE PROCEDURE  You will likely be able to leave shortly after the test. Unless your health care provider tells you otherwise, you may return to your normal schedule, including diet, activities, and medicines.  Make sure you find out how and when you will get your test results.   This information is not intended to replace advice given to you by your health care provider. Make sure you discuss any questions you have with your health care provider.   Document Released: 12/28/2004 Document Revised: 12/08/2013 Document Reviewed: 11/11/2013 Elsevier Interactive Patient Education [employer].

## 2016-09-12 ENCOUNTER — Encounter: Payer: Self-pay | Admitting: Internal Medicine

## 2016-09-12 ENCOUNTER — Other Ambulatory Visit: Payer: Self-pay | Admitting: Internal Medicine

## 2016-09-12 DIAGNOSIS — R600 Localized edema: Secondary | ICD-10-CM

## 2016-09-21 ENCOUNTER — Encounter
Admission: RE | Admit: 2016-09-21 | Discharge: 2016-09-21 | Disposition: A | Discharge status: Home / Self Care | Payer: BC Managed Care – PPO | Source: Ambulatory Visit | Attending: Cardiology | Admitting: Cardiology

## 2016-09-21 DIAGNOSIS — I4892 Unspecified atrial flutter: Secondary | ICD-10-CM | POA: Insufficient documentation

## 2016-09-21 DIAGNOSIS — R0789 Other chest pain: Secondary | ICD-10-CM | POA: Diagnosis not present

## 2016-09-21 LAB — NM MYOCAR MULTI W/SPECT W/WALL MOTION / EF
CSEPEDS: 52 s
CSEPPHR: 151 {beats}/min
Estimated workload: 11.5 METS
Exercise duration (min): 9 min
LVDIAVOL: 81 mL (ref 62–150)
LVSYSVOL: 40 mL
MPHR: 159 {beats}/min
NUC STRESS TID: 1
Percent HR: 94 %
Rest HR: 57 {beats}/min
SDS: 2
SRS: 3
SSS: 3

## 2016-09-21 MED ORDER — TECHNETIUM TC 99M TETROFOSMIN IV KIT
12.7900 | PACK | Freq: Once | INTRAVENOUS | Status: AC | PRN
  Administered 2016-09-21: 12.79 via INTRAVENOUS

## 2016-09-21 MED ORDER — TECHNETIUM TC 99M TETROFOSMIN IV KIT
29.9790 | PACK | Freq: Once | INTRAVENOUS | Status: AC | PRN
  Administered 2016-09-21: 29.979 via INTRAVENOUS

## 2016-10-05 ENCOUNTER — Ambulatory Visit (INDEPENDENT_AMBULATORY_CARE_PROVIDER_SITE_OTHER): Payer: BC Managed Care – PPO | Admitting: Vascular Surgery

## 2016-10-05 ENCOUNTER — Encounter (INDEPENDENT_AMBULATORY_CARE_PROVIDER_SITE_OTHER): Payer: Self-pay | Admitting: Vascular Surgery

## 2016-10-05 VITALS — BP 129/90 | HR 49 | Resp 17 | Ht 71.0 in | Wt 210.0 lb

## 2016-10-05 DIAGNOSIS — M79609 Pain in unspecified limb: Secondary | ICD-10-CM | POA: Insufficient documentation

## 2016-10-05 DIAGNOSIS — M79605 Pain in left leg: Secondary | ICD-10-CM

## 2016-10-05 DIAGNOSIS — M79604 Pain in right leg: Secondary | ICD-10-CM

## 2016-10-05 DIAGNOSIS — M7989 Other specified soft tissue disorders: Secondary | ICD-10-CM | POA: Diagnosis not present

## 2016-10-05 DIAGNOSIS — E781 Pure hyperglyceridemia: Secondary | ICD-10-CM

## 2016-10-05 NOTE — Assessment & Plan Note (Signed)
Cause not entirely clear. He has been wearing compression stockings which helps. We'll plan a venous duplex for further evaluation for venous insufficiency. Return to clinic in follow-up after his duplex.

## 2016-10-05 NOTE — Assessment & Plan Note (Signed)
lipid control important in reducing the progression of atherosclerotic disease.   

## 2016-10-05 NOTE — Assessment & Plan Note (Signed)
Cause not entirely clear. He has been wearing compression stockings which helps. We'll plan a venous duplex for further evaluation for venous insufficiency. Return to clinic in follow-up after his duplex. We discussed pathophysiology of venous disease as well as lymphedema.

## 2016-10-05 NOTE — Progress Notes (Signed)
Patient ID: Harold Schwartz, male   DOB: 1956-02-10, 60 y.o.   MRN: [id number]  Chief Complaint  Patient presents with  . New Patient (Initial Visit)    HPI Harold Schwartz is a 60 y.o. male.  I am asked to see the patient by Dr. Garnette Gunner for evaluation of leg swelling.  The patient presents with complaints of prominent swelling of the lower extremities. The patient reports a long standing history of swelling and they have become painful over time. 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  The left leg is more severly affected. The patient elevates the legs for relief. The pain is described as heaviness and aching. The symptoms are generally most severe in the evening, particularly when they have been on their feet for long periods of time. Compression stockings and elevation has been used to try to improve the symptoms with moderate success. The patient complains of prominent left leg swelling as an associated symptom. The patient has no previous history of deep venous thrombosis or superficial thrombophlebitis to their knowledge.     Past Medical History:  Diagnosis Date  . Atrial flutter ([place])   . Colon polyps   . Depression   . Genital herpes   . Heart murmur   . Hemorrhoids   . Hyperlipidemia   . Hypogonadism in male     Past Surgical History:  Procedure Laterality Date  . KNEE ARTHROSCOPY    . VASECTOMY  1990    Family History  Problem Relation Age of Onset  . Breast cancer Mother   . Heart disease Mother   . Hyperlipidemia Mother   . Hypertension Mother   . Colon cancer Father   . Heart disease Father   . Alzheimer's disease Father   . Hyperlipidemia Father   . Hyperlipidemia Brother     Social History Social History  Substance Use Topics  . Smoking status: Never Smoker  . Smokeless tobacco: Never Used  . Alcohol use No     Comment: moderate  No  IV drug use  No Known Allergies  Current Outpatient Prescriptions  Medication Sig Dispense Refill  . aspirin EC 81 MG tablet Take 1 tablet (81 mg total) by mouth daily. 30 tablet 0  . AXIRON 30 MG/ACT SOLN PLACE 1 PUMP UNDER EACH ARMPIT DAILY 270 mL 3  . ezetimibe (ZETIA) 10 MG tablet Take 1 tablet (10 mg total) by mouth daily. 90 tablet 1  . glucosamine-chondroitin 500-400 MG tablet Take 1 tablet by mouth 2 (two) times daily.    . hydrochlorothiazide (MICROZIDE) 12.5 MG capsule Take 1 capsule (12.5 mg total) by mouth daily as needed. 30 capsule 1  . hydrocortisone-pramoxine (PROCTOFOAM-HC) rectal foam Place 1 applicator rectally 2 (two) times daily. 10 g 0  . lidocaine (XYLOCAINE) 5 % ointment Apply 1 application topically as needed. 35.44 g 0  . metoprolol succinate (TOPROL-XL) 25 MG 24 hr tablet TAKE 1 TABLET (25 MG TOTAL) BY MOUTH 2 (TWO) TIMES DAILY. 180 tablet 1  . metoprolol tartrate (LOPRESSOR) 25 MG tablet Take 1 tablet (25 mg total) by mouth daily as needed. As needed for episode. 30 tablet 6  . NON FORMULARY 2 capsules daily. Celery seed extract    . Omega-3 Fatty Acids (FISH OIL) 1200 MG CAPS Take 5 capsules by mouth daily.    . pramoxine (PROCTOFOAM) 1 %  foam Place 1 application rectally 3 (three) times daily as needed for itching. 15 g 0  . valACYclovir (VALTREX) 1000 MG tablet Take 1 tablet (1,000 mg total) by mouth daily. 90 tablet 3   No current facility-administered medications for this visit.       REVIEW OF SYSTEMS (Negative unless checked)  Constitutional: [] Weight loss  [] Fever  [] Chills Cardiac: [] Chest pain   [] Chest pressure   [x] Palpitations   [] Shortness of breath when laying flat   [] Shortness of breath at rest   [] Shortness of breath with exertion. Vascular:  [] Pain in legs with walking   [] Pain in legs at rest   [] Pain in legs when laying flat   [] Claudication   [] Pain in feet when walking  [] Pain in feet at rest  [] Pain in feet when laying flat   [] History  of DVT   [] Phlebitis   [x] Swelling in legs   [x] Varicose veins   [] Non-healing ulcers Pulmonary:   [] Uses home oxygen   [] Productive cough   [] Hemoptysis   [] Wheeze  [] COPD   [] Asthma Neurologic:  [] Dizziness  [] Blackouts   [] Seizures   [] History of stroke   [] History of TIA  [] Aphasia   [] Temporary blindness   [] Dysphagia   [] Weakness or numbness in arms   [] Weakness or numbness in legs Musculoskeletal:  [] Arthritis   [] Joint swelling   [] Joint pain   [] Low back pain Hematologic:  [] Easy bruising  [] Easy bleeding   [] Hypercoagulable state   [] Anemic  [] Hepatitis Gastrointestinal:  [] Blood in stool   [] Vomiting blood  [] Gastroesophageal reflux/heartburn   [] Abdominal pain Genitourinary:  [] Chronic kidney disease   [] Difficult urination  [] Frequent urination  [] Burning with urination   [] Hematuria Skin:  [] Rashes   [] Ulcers   [] Wounds Psychological:  [] History of anxiety   []  History of major depression.    Physical Exam BP 129/90   Pulse (!) 49   Resp 17   Ht 5\' 11"  (1.803 m)   Wt 210 lb (95.3 kg)   BMI 29.29 kg/m  Gen:  WD/WN, NAD Head: [place]/AT, No temporalis wasting.  Ear/Nose/Throat: Hearing grossly intact, dentition good Eyes: Sclera non-icteric. Conjunctiva clear Neck: Supple, no nuchal rigidity. Trachea midline Pulmonary:  Good air movement, no use of accessory muscles, respirations not labored.  Cardiac: RRR, No JVD Vascular: Varicosities scant in the right lower extremity        Varicosities scattered and measuring up to 3 mm in the left lower extremity Vessel Right Left  Radial Palpable Palpable  Ulnar Palpable Palpable  Brachial Palpable Palpable  Carotid Palpable, without bruit Palpable, without bruit  Aorta Not palpable N/A  Femoral Palpable Palpable  Popliteal Palpable Palpable  PT Palpable Palpable  DP Palpable Palpable   Gastrointestinal: soft, non-tender/non-distended. No guarding/reflex. No masses, surgical incisions, or scars. Musculoskeletal: M/S 5/5  throughout.   No RLE edema.  Trace to 1 + LLE edema Neurologic: CN 2-12 intact. Pain and light touch intact in extremities.  Symmetrical.  Speech is fluent.  Psychiatric: Judgment intact, Mood & affect appropriate for pt's clinical situation. Dermatologic: No rashes or ulcers noted.  No cellulitis or open wounds. Lymph : No Cervical, Axillary, or Inguinal lymphadenopathy.  Radiology Nm Myocar Multi W/spect W/wall Motion / Ef  Result Date: 09/21/2016  Blood pressure demonstrated a normal response to exercise. Excellent exercise capacity.  There was no ST segment deviation noted during stress.  The study is normal.  This is a low risk study.  The left ventricular ejection fraction  is normal (55-65%).     Labs Recent Results (from the past 2160 hour(s))  NM Myocar Multi W/Spect W/Wall Motion / EF     Status: None   Collection Time: 09/21/16 11:30 AM  Result Value Ref Range   Rest HR 57 bpm   Rest BP 128/81 mmHg   Exercise duration (sec) 52 sec   Percent HR 94 %   Exercise duration (min) 9 min   Estimated workload 11.5 METS   Peak HR 151 bpm   Peak BP 162/98 mmHg   MPHR 159 bpm   SSS 3    SRS 3    SDS 2    TID 1.00    LV sys vol 40 mL   LV dias vol 81 62 - 150 mL    Assessment/Plan:  Hypertriglyceridemia lipid control important in reducing the progression of atherosclerotic disease.    Pain in limb Cause not entirely clear. He has been wearing compression stockings which helps. We'll plan a venous duplex for further evaluation for venous insufficiency. Return to clinic in follow-up after his duplex.  Swelling of limb Cause not entirely clear. He has been wearing compression stockings which helps. We'll plan a venous duplex for further evaluation for venous insufficiency. Return to clinic in follow-up after his duplex. We discussed pathophysiology of venous disease as well as lymphedema.    The patient has symptoms consistent with chronic venous insufficiency. We  discussed the natural history and treatment options for venous disease. I recommended the regular use of 20 - 30 mm Hg compression stockings, and He is already using these. I recommended leg elevation and anti-inflammatories as needed for pain. I have also recommended a complete venous duplex to assess the venous system for reflux or thrombotic issues. This can be done at the patient's convenience. I will see the patient back after the duplex to assess the response to conservative management, and determine further treatment options.     Leotis Pain 10/05/2016, 11:26 AM   This note was created with Dragon medical transcription system.  Any errors from dictation are unintentional.

## 2016-10-16 ENCOUNTER — Telehealth: Payer: Self-pay | Admitting: *Deleted

## 2016-10-16 DIAGNOSIS — E349 Endocrine disorder, unspecified: Secondary | ICD-10-CM

## 2016-10-16 MED ORDER — TESTOSTERONE 30 MG/ACT TD SOLN: TRANSDERMAL | 2 refills | Status: DC

## 2016-10-16 NOTE — Telephone Encounter (Signed)
Generic testosterone Rx faxed to [facility]

## 2016-10-16 NOTE — Telephone Encounter (Signed)
I have changed to generic---Rx printed and placed in your box for signature

## 2016-10-16 NOTE — Telephone Encounter (Signed)
Does he know which alternatives are covered?

## 2016-10-16 NOTE — Telephone Encounter (Signed)
RX signed and placed in MYD box

## 2016-10-16 NOTE — Addendum Note (Signed)
Addended by: Lurlean Nanny on: 10/16/2016 01:55 PM   Modules accepted: Orders

## 2016-10-16 NOTE — Telephone Encounter (Signed)
Adam at [facility] left a message that manufacturer is no longer making Axiron, but they can get it in a generic which will need a PA. Adam wants to know if you want to prescribe an alternative or start the process of a PA?

## 2016-10-25 ENCOUNTER — Ambulatory Visit: Payer: BC Managed Care – PPO

## 2016-10-26 ENCOUNTER — Ambulatory Visit (INDEPENDENT_AMBULATORY_CARE_PROVIDER_SITE_OTHER): Payer: BC Managed Care – PPO

## 2016-10-26 DIAGNOSIS — Z23 Encounter for immunization: Secondary | ICD-10-CM

## 2016-11-21 ENCOUNTER — Ambulatory Visit (INDEPENDENT_AMBULATORY_CARE_PROVIDER_SITE_OTHER): Payer: BC Managed Care – PPO

## 2016-11-21 ENCOUNTER — Ambulatory Visit (INDEPENDENT_AMBULATORY_CARE_PROVIDER_SITE_OTHER): Payer: BC Managed Care – PPO | Admitting: Vascular Surgery

## 2016-11-21 ENCOUNTER — Encounter (INDEPENDENT_AMBULATORY_CARE_PROVIDER_SITE_OTHER): Payer: Self-pay | Admitting: Vascular Surgery

## 2016-11-21 VITALS — BP 116/76 | HR 55 | Resp 16 | Ht 71.0 in | Wt 213.4 lb

## 2016-11-21 DIAGNOSIS — I872 Venous insufficiency (chronic) (peripheral): Secondary | ICD-10-CM | POA: Diagnosis not present

## 2016-11-21 DIAGNOSIS — I825Y2 Chronic embolism and thrombosis of unspecified deep veins of left proximal lower extremity: Secondary | ICD-10-CM | POA: Diagnosis not present

## 2016-11-21 DIAGNOSIS — M7989 Other specified soft tissue disorders: Secondary | ICD-10-CM | POA: Diagnosis not present

## 2016-11-21 NOTE — Progress Notes (Signed)
Subjective:    Patient ID: Harold Schwartz, male    DOB: 10/22/1956, 60 y.o.   MRN: [id number] Chief Complaint  Patient presents with  . Follow-up   Patient last seen on 10/05/16 for [name] for evaluation of leg swelling. The patient presented with complaints of prominent swelling of the lower extremities. The patient reported a long standing history of swelling which has become painful over time. 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  The left leg is more severely affected. The pain is described as heaviness and aching. The symptoms are generally most severe in the evening, particularly when they have been on their feet for long periods of time. The patient has no previous history of deep venous thrombosis or superficial thrombophlebitis to their knowledge. The patient has been wearing compression stockings and elevating his legs with minimal relief since his initial visit. Today, he underwent a bilateral lower extremity venous duplex which was notable for RIGHT: No DVT, No , SVT, No Reflux / LEFT: Chronic non-occlusive DVT in the middle distal femoral vein, popliteal and gastrocnemius vein, No SVT, reflux in the GSV.    Review of Systems Constitutional: [] Weight loss  [] Fever  [] Chills Cardiac: [] Chest pain   [] Chest pressure   [x] Palpitations   [] Shortness of breath when laying flat   [] Shortness of breath at rest   [] Shortness of breath with exertion. Vascular:  [] Pain in legs with walking   [] Pain in legs at rest   [] Pain in legs when laying flat   [] Claudication   [] Pain in feet when walking  [] Pain in feet at rest  [] Pain in feet when laying flat   [] History of DVT   [] Phlebitis   [x] Swelling in legs   [x] Varicose veins   [] Non-healing ulcers Pulmonary:   [] Uses home oxygen   [] Productive cough   [] Hemoptysis   [] Wheeze  [] COPD   [] Asthma Neurologic:  [] Dizziness   [] Blackouts   [] Seizures   [] History of stroke   [] History of TIA  [] Aphasia   [] Temporary blindness   [] Dysphagia   [] Weakness or numbness in arms   [] Weakness or numbness in legs Musculoskeletal:  [] Arthritis   [] Joint swelling   [] Joint pain   [] Low back pain Hematologic:  [] Easy bruising  [] Easy bleeding   [] Hypercoagulable state   [] Anemic  [] Hepatitis Gastrointestinal:  [] Blood in stool   [] Vomiting blood  [] Gastroesophageal reflux/heartburn   [] Abdominal pain Genitourinary:  [] Chronic kidney disease   [] Difficult urination  [] Frequent urination  [] Burning with urination   [] Hematuria Skin:  [] Rashes   [] Ulcers   [] Wounds Psychological:  [] History of anxiety   []  History of major depression.    Objective:   Physical Exam Gen:  WD/WN, NAD Head: [place]/AT, No temporalis wasting.  Ear/Nose/Throat: Hearing grossly intact, dentition good Eyes: Sclera non-icteric. Conjunctiva clear Neck: Supple, no nuchal rigidity. Trachea midline Pulmonary:  Good air movement, no use of accessory muscles, respirations not labored.  Cardiac: RRR, No JVD Vascular: Varicosities scant in the right lower extremity                   Varicosities scattered and measuring up to 3 mm in the left lower extremity Vessel Right Left  Radial Palpable Palpable  Ulnar Palpable Palpable  Brachial Palpable Palpable  Carotid Palpable, without bruit Palpable, without bruit  Aorta Not palpable N/A  Femoral Palpable Palpable  Popliteal Palpable Palpable  PT Palpable Palpable  DP Palpable Palpable   Gastrointestinal: soft, non-tender/non-distended. No guarding/reflex. No masses, surgical incisions, or scars. Musculoskeletal: M/S 5/5 throughout.   No RLE edema.  Trace to 1 + LLE edema. LLE - non-tender to palpation. No pain with dorsi-flexion.  Neurologic: CN 2-12 intact. Pain and light touch intact in extremities.  Symmetrical.  Speech is fluent.  Psychiatric: Judgment intact, Mood & affect appropriate for pt's clinical  situation. Dermatologic: No rashes or ulcers noted.  No cellulitis or open wounds. Lymph : No Cervical, Axillary, or Inguinal lymphadenopathy.  BP 116/76   Pulse (!) 55   Resp 16   Ht 5\' 11"  (1.803 m)   Wt 213 lb 6.4 oz (96.8 kg)   BMI 29.76 kg/m   Past Medical History:  Diagnosis Date  . Atrial flutter ([place])   . Colon polyps   . Depression   . Genital herpes   . Heart murmur   . Hemorrhoids   . Hyperlipidemia   . Hypogonadism in male    Social History   Social History  . Marital status: Married    Spouse name: N/A  . Number of children: 2  . Years of education: N/A   Occupational History  . Engineer, site    Social History Main Topics  . Smoking status: Never Smoker  . Smokeless tobacco: Never Used  . Alcohol use No     Comment: moderate  . Drug use: No  . Sexual activity: Not on file   Other Topics Concern  . Not on file   Social History Narrative  . No narrative on file   Past Surgical History:  Procedure Laterality Date  . KNEE ARTHROSCOPY    . VASECTOMY  1990   Family History  Problem Relation Age of Onset  . Breast cancer Mother   . Heart disease Mother   . Hyperlipidemia Mother   . Hypertension Mother   . Colon cancer Father   . Heart disease Father   . Alzheimer's disease Father   . Hyperlipidemia Father   . Hyperlipidemia Brother    No Known Allergies     Assessment & Plan:  Patient last seen on 10/05/16 for [name] for evaluation of leg swelling. The patient presented with complaints of prominent swelling of the lower extremities. The patient reported a long standing history of swelling which has become painful over time. 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  The left leg is more severely affected. The pain is described as heaviness and aching. The symptoms are generally most severe in the evening,  particularly when they have been on their feet for long periods of time. The patient has no previous history of deep venous thrombosis or superficial thrombophlebitis to their knowledge. The patient has been wearing compression stockings and elevating his legs with minimal relief since his initial visit. Today, he underwent a bilateral lower extremity venous duplex which was notable for RIGHT: No DVT, No , SVT, No Reflux / LEFT: Chronic non-occlusive DVT in the middle distal femoral vein, popliteal and gastrocnemius vein, No SVT, reflux in the GSV.   1. Venous insufficiency - New Study reviewed with patient. I have discussed chronic venous insufficiency with the patient, why it causes symptoms and how to manage them.  The patient was encouraged to wear  graduated compression stockings (20-30 mmHg) on a daily basis. The patient was instructed to begin wearing the stockings first thing in the morning and removing them in the evening. The patient was instructed specifically not to sleep in the stockings. Patient has stockings.  In addition, behavioral modification including elevation during the day will be initiated. Anti-inflammatories for pain. The patient is likely to benefit from endovenous laser ablation. I have discussed the risks and benefits of the procedure. The risks primarily include DVT, recanalization, bleeding, infection, and inability to gain access. The patient will follow up in three months to asses conservative management.  Information on chronic venous insufficiency and compression stockings was given to the patient. The patient was instructed to call the office in the interim if any worsening edema or ulcerations to the legs, feet or toes occurs. The patient expresses their understanding.  2. Chronic deep vein thrombosis (DVT) of proximal vein of left lower extremity ([place]) - New Due to chronicity of DVT OK to treat with ASA. Patient to wear compression stockings and elevation for  symptomatic control. Patient to follow up in three months for repeat DVT. Patient to seek medical attention immediately if he should experiences any SOB or chest pain. He expresses his understanding.   - VAS [place] LOWER EXTREMITY VENOUS (DVT); Future  3. Swelling of limb - Stable Exacerbated by DVT and reflux. Compression and elevation for now. Will re-evaluate in three months.   Current Outpatient Prescriptions on File Prior to Visit  Medication Sig Dispense Refill  . aspirin EC 81 MG tablet Take 1 tablet (81 mg total) by mouth daily. 30 tablet 0  . ezetimibe (ZETIA) 10 MG tablet Take 1 tablet (10 mg total) by mouth daily. 90 tablet 1  . glucosamine-chondroitin 500-400 MG tablet Take 1 tablet by mouth 2 (two) times daily.    . hydrochlorothiazide (MICROZIDE) 12.5 MG capsule Take 1 capsule (12.5 mg total) by mouth daily as needed. 30 capsule 1  . hydrocortisone-pramoxine (PROCTOFOAM-HC) rectal foam Place 1 applicator rectally 2 (two) times daily. 10 g 0  . lidocaine (XYLOCAINE) 5 % ointment Apply 1 application topically as needed. 35.44 g 0  . metoprolol succinate (TOPROL-XL) 25 MG 24 hr tablet TAKE 1 TABLET (25 MG TOTAL) BY MOUTH 2 (TWO) TIMES DAILY. 180 tablet 1  . NON FORMULARY 2 capsules daily. Celery seed extract    . Omega-3 Fatty Acids (FISH OIL) 1200 MG CAPS Take 5 capsules by mouth daily.    . pramoxine (PROCTOFOAM) 1 % foam Place 1 application rectally 3 (three) times daily as needed for itching. 15 g 0  . Testosterone 30 MG/ACT SOLN 1 APPLICATION UNDER EACH ARMPIT DAILY 270 mL 2  . valACYclovir (VALTREX) 1000 MG tablet Take 1 tablet (1,000 mg total) by mouth daily. 90 tablet 3  . metoprolol tartrate (LOPRESSOR) 25 MG tablet Take 1 tablet (25 mg total) by mouth daily as needed. As needed for episode. 30 tablet 6   No current facility-administered medications on file prior to visit.     There are no Patient Instructions on file for this visit. No Follow-up on file.   Tyrea Froberg  A Travis Mastel, [name]

## 2016-11-22 ENCOUNTER — Encounter (INDEPENDENT_AMBULATORY_CARE_PROVIDER_SITE_OTHER): Payer: Self-pay | Admitting: Vascular Surgery

## 2016-11-23 ENCOUNTER — Encounter (INDEPENDENT_AMBULATORY_CARE_PROVIDER_SITE_OTHER): Payer: Self-pay | Admitting: Vascular Surgery

## 2016-12-20 ENCOUNTER — Other Ambulatory Visit: Payer: Self-pay | Admitting: Internal Medicine

## 2017-02-11 ENCOUNTER — Encounter: Payer: Self-pay | Admitting: Cardiology

## 2017-02-12 ENCOUNTER — Ambulatory Visit (INDEPENDENT_AMBULATORY_CARE_PROVIDER_SITE_OTHER): Payer: BC Managed Care – PPO | Admitting: Cardiology

## 2017-02-12 ENCOUNTER — Encounter (INDEPENDENT_AMBULATORY_CARE_PROVIDER_SITE_OTHER): Payer: BC Managed Care – PPO

## 2017-02-12 ENCOUNTER — Telehealth: Payer: Self-pay | Admitting: Cardiology

## 2017-02-12 ENCOUNTER — Ambulatory Visit (INDEPENDENT_AMBULATORY_CARE_PROVIDER_SITE_OTHER): Payer: BC Managed Care – PPO | Admitting: Vascular Surgery

## 2017-02-12 ENCOUNTER — Encounter: Payer: Self-pay | Admitting: Cardiology

## 2017-02-12 VITALS — BP 110/84 | HR 85 | Ht 71.0 in | Wt 213.2 lb

## 2017-02-12 DIAGNOSIS — I4892 Unspecified atrial flutter: Secondary | ICD-10-CM

## 2017-02-12 MED ORDER — APIXABAN 5 MG PO TABS: 5.0000 mg | ORAL_TABLET | Freq: Two times a day (BID) | ORAL | 4 refills | Status: DC

## 2017-02-12 NOTE — Telephone Encounter (Signed)
Pt states his HR has been elevated for the last 2 days. Ranges from 75-135. Please call. No SOB, mild dizziness, states it is just uncomfortable.

## 2017-02-12 NOTE — Progress Notes (Signed)
Cardiology Office Note   Date:  02/12/2017   ID:  Harold Schwartz, DOB August 21, 1956, MRN [id number]  Referring Doctor:  Webb Silversmith, NP   Cardiologist:   Wende Bushy, MD   Reason for consultation:  Chief Complaint  Patient presents with  . OTHER    Elevated HR x2 days c/o chest discomfort today. Meds reviewed verbally with pt.      History of Present Illness: Harold Schwartz is a 61 y.o. male who presents forFollow-up for history of paroxysmal atrial flutter  Since his last visit, patient had been doing well until 2 days ago. He started noticing increased heart rate and palpitations. Chest pain associated with the increased heart rate.  No PND, orthopnea, edema. No bleeding. No syncope.  ROS:  Please see the history of present illness. Aside from mentioned under HPI, all other systems are reviewed and negative.    Past Medical History:  Diagnosis Date  . Atrial flutter ([place])   . Colon polyps   . Depression   . Genital herpes   . Heart murmur   . Hemorrhoids   . Hyperlipidemia   . Hypogonadism in male     Past Surgical History:  Procedure Laterality Date  . KNEE ARTHROSCOPY    . VASECTOMY  1990     reports that he has never smoked. He has never used smokeless tobacco. He reports that he does not drink alcohol or use drugs.   family history includes Alzheimer's disease in his father; Breast cancer in his mother; Colon cancer in his father; Heart disease in his father and mother; Hyperlipidemia in his brother, father, and mother; Hypertension in his mother.  History of atrial fibrillation in family  Outpatient Medications Prior to Visit  Medication Sig Dispense Refill  . ezetimibe (ZETIA) 10 MG tablet TAKE 1 TABLET (10 MG TOTAL) BY MOUTH DAILY. 90 tablet 0  . glucosamine-chondroitin 500-400 MG tablet Take 1 tablet by mouth 2 (two) times daily.    . metoprolol succinate (TOPROL-XL) 25 MG 24 hr tablet TAKE 1 TABLET (25 MG TOTAL) BY MOUTH 2 (TWO) TIMES DAILY. 180  tablet 1  . metoprolol tartrate (LOPRESSOR) 25 MG tablet Take 1 tablet (25 mg total) by mouth daily as needed. As needed for episode. 30 tablet 6  . NON FORMULARY 2 capsules daily. Celery seed extract    . Omega-3 Fatty Acids (FISH OIL) 1200 MG CAPS Take 5 capsules by mouth daily.    . pramoxine (PROCTOFOAM) 1 % foam Place 1 application rectally 3 (three) times daily as needed for itching. 15 g 0  . Testosterone 30 MG/ACT SOLN 1 APPLICATION UNDER EACH ARMPIT DAILY 270 mL 2  . valACYclovir (VALTREX) 1000 MG tablet Take 1 tablet (1,000 mg total) by mouth daily. 90 tablet 3  . aspirin EC 81 MG tablet Take 1 tablet (81 mg total) by mouth daily. 30 tablet 0  . hydrochlorothiazide (MICROZIDE) 12.5 MG capsule Take 1 capsule (12.5 mg total) by mouth daily as needed. (Patient not taking: Reported on 02/12/2017) 30 capsule 1  . hydrocortisone-pramoxine (PROCTOFOAM-HC) rectal foam Place 1 applicator rectally 2 (two) times daily. (Patient not taking: Reported on 02/12/2017) 10 g 0  . lidocaine (XYLOCAINE) 5 % ointment Apply 1 application topically as needed. (Patient not taking: Reported on 02/12/2017) 35.44 g 0   No facility-administered medications prior to visit.      Allergies: Patient has no known allergies.    PHYSICAL EXAM: VS:  BP 110/84 (BP Location: Left  Arm, Patient Position: Sitting, Cuff Size: Normal)   Pulse 85   Ht 5\' 11"  (1.803 m)   Wt 213 lb 4 oz (96.7 kg)   BMI 29.74 kg/m  , Body mass index is 29.74 kg/m. Wt Readings from Last 3 Encounters:  02/12/17 213 lb 4 oz (96.7 kg)  11/21/16 213 lb 6.4 oz (96.8 kg)  10/05/16 210 lb (95.3 kg)    GENERAL:  well developed, well nourished, not in acute distress HEENT: normocephalic, pink conjunctivae, anicteric sclerae, no xanthelasma, normal dentition, oropharynx clear NECK:  no neck vein engorgement, JVP normal, no hepatojugular reflux, carotid upstroke brisk and symmetric, no bruit, no thyromegaly, no lymphadenopathy LUNGS:  good  respiratory effort, clear to auscultation bilaterally CV:  PMI not displaced, no thrills, no lifts, S1 and S2 within normal limits, no palpable S3 or S4, no murmurs, no rubs, no gallops ABD:  Soft, nontender, nondistended, normoactive bowel sounds, no abdominal aortic bruit, no hepatomegaly, no splenomegaly MS: nontender back, no kyphosis, no scoliosis, no joint deformities EXT:  2+ DP/PT pulses, no edema, no varicosities, no cyanosis, no clubbing SKIN: warm, nondiaphoretic, normal turgor, no ulcers NEUROPSYCH: alert, oriented to person, place, and time, sensory/motor grossly intact, normal mood, appropriate affect   Recent Labs: 06/29/2016: ALT 15; BUN 20; Creatinine, Ser 1.09; Hemoglobin 15.1; Platelets 141.0; Potassium 4.7; Sodium 139   Lipid Panel    Component Value Date/Time   CHOL 176 06/29/2016 0819   TRIG 66.0 06/29/2016 0819   HDL 65.00 06/29/2016 0819   CHOLHDL 3 06/29/2016 0819   VLDL 13.2 06/29/2016 0819   LDLCALC 98 06/29/2016 0819     Other studies Reviewed:  EKG:  The ekg from 07/26/2016 was personally reviewed by me and it revealed sinus rhythm, sinus arrhythmia, 65 BPM.  EKG from 02/12/2017 was personally reviewed by me and it showed atrial flutter with variable conduction, ventricular rate of 85 BPM. Nonspecific ST-T wave changes.  Additional studies/ records that were reviewed personally reviewed by me today include:  Echo 07/30/2016: Left ventricle: The cavity size was normal. Systolic function was   normal. The estimated ejection fraction was in the range of 60%   to 65%. Wall motion was normal; there were no regional wall   motion abnormalities. Left ventricular diastolic function   parameters were normal. - Mitral valve: There was mild regurgitation. - Left atrium: The atrium was normal in size. - Right ventricle: Systolic function was normal. - Pulmonary arteries: Systolic pressure was within the normal   range.  Impressions:  - Rhythm is normal  sinus.  Event monitor 08/31/2016:  Monitoring period was 08/01/2016 2 08/30/2016.  Baseline samples was sinus rhythm, 59 BPM.  Automatically detected events/asymptomatic events: Atrial flutter with variable conduction, PVCs, heart rate 90 BPM: 08/24/2016, at 11:40 PM Sinus rhythm with 7 beat run of PSVT, likely atrial tachycardia at 160 BPM: 08/12/2016, 4:49 PM  18 Manually detected events: Fluttering or skipped beats: Sinus rhythm with PVCs, sinus rhythm with PACs, sinus rhythm, sinus arrhythmia Chest pain: Sinus arrhythmia  Nuclear stress test 09/21/2016:  Blood pressure demonstrated a normal response to exercise. Excellent exercise capacity.  There was no ST segment deviation noted during stress.  The study is normal.  This is a low risk study.  The left ventricular ejection fraction is normal (55-65%).   ASSESSMENT AND PLAN:  History of atrial flutter,  Paroxysmal Currently atrial flutter with variable conduction, ventricular rate is controlled Continue Toprol-XL, switch to 50mg  daily. Continue metoprolol tartrate  25 mg by mouth daily as needed. CHADS2-VASc=0.  Discontinue aspirin 325 mg by mouth daily. Start Eliquis 5mg  twice a day. He will need to see Dr. Belva Chimes for atrial flutter ablation, hence we will need to start anticoagulation. Recommend sleep study to rule out OSA. Patient would like to think about this. Recommend alcohol abstinence and limiting caffeine intake  CP previously reported Nuclear stress test showed no evidence of ischemia.   Current medicines are reviewed at length with the patient today.  The patient does not have concerns regarding medicines.  Labs/ tests ordered today include:  Orders Placed This Encounter  Procedures  . EKG 12-Lead   Disposition:   FU with undersigned  In 3 months    Signed, Wende Bushy, MD  02/12/2017 2:06 PM    [place]  This note was generated in part with voice recognition  software and I apologize for any typographical errors that were not detected and corrected.

## 2017-02-12 NOTE — Patient Instructions (Addendum)
Medication Instructions:  Your physician has recommended you make the following change in your medication:  1. STOP Aspirin 2. CHANGE Toprol to once daily 3. START Eliquis 5 mg twice a day Medication Samples have been provided to the patient.  Drug name: Eliquis       Strength: 5 mg        Qty: 4 boxes  LOT: [id number]  Exp.Date: Mar 2020  Dosing instructions: One tablet twice a day   Your physician has requested that you regularly monitor and record your blood pressure readings at home. Please use the same machine at the same time of day to check your readings and record them to bring to your follow-up visit.   Follow-Up: Your physician wants you to follow-up in: 3 months. You will receive a reminder letter in the mail two months in advance. If you don't receive a letter, please call our office to schedule the follow-up appointment.  You have been referred to Dr. Caryl Comes.    It was a pleasure seeing you today here in the office. Please do not hesitate to give [place] a call back if you have any further questions. La Ver[place], BSN  .Marland Kitchen

## 2017-02-12 NOTE — Telephone Encounter (Signed)
Spoke with patient and reviewed Dr. Tora Kindred recommendations regarding message he sent earlier. He states that this heart rate has been causing problems and he would really like to come in to discuss this. We have an opening at 1 today so scheduled him to come in to see Dr. Yvone Neu per his request.

## 2017-02-14 ENCOUNTER — Encounter: Payer: Self-pay | Admitting: Internal Medicine

## 2017-02-14 ENCOUNTER — Other Ambulatory Visit: Payer: Self-pay | Admitting: Internal Medicine

## 2017-02-14 ENCOUNTER — Ambulatory Visit (INDEPENDENT_AMBULATORY_CARE_PROVIDER_SITE_OTHER): Payer: BC Managed Care – PPO | Admitting: Internal Medicine

## 2017-02-14 VITALS — BP 112/66 | HR 85 | Ht 71.0 in | Wt 212.8 lb

## 2017-02-14 DIAGNOSIS — I4892 Unspecified atrial flutter: Secondary | ICD-10-CM

## 2017-02-14 NOTE — Patient Instructions (Addendum)
Medication Instructions: - Your physician recommends that you continue on your current medications as directed. Please refer to the Current Medication list given to you today.  Labwork: - Your physician recommends that you have lab work today: BMP/CBC  Procedures/Testing: - Your physician has requested that you have a TEE. During a TEE, sound waves are used to create images of your heart. It provides your doctor with information about the size and shape of your heart and how well your heart's chambers and valves are working. In this test, a transducer is attached to the end of a flexible tube that's guided down your throat and into your esophagus (the tube leading from you mouth to your stomach) to get a more detailed image of your heart. You are not awake for the procedure. Please see the instruction sheet given to you today. For further information please visit [email or web address].  - Your physician has recommended that you have a Cardioversion (DCCV). Electrical Cardioversion uses a jolt of electricity to your heart either through paddles or wired patches attached to your chest. This is a controlled, usually prescheduled, procedure. Defibrillation is done under light anesthesia in the hospital, and you usually go home the day of the procedure. This is done to get your heart back into a normal rhythm. You are not awake for the procedure. Please see the instruction sheet given to you today.  You are scheduled for a TEE/Cardioversion on ________________ with Dr.___________ Please arrive at the [place] [facility] at _________ a.m. on the day of your procedure.  DIET INSTRUCTIONS:  Nothing to eat or drink after midnight except your medications with a              sip of water.         1) Labs: ___3/1/18_____  2) Medications:  YOU MAY TAKE ALL of your remaining medications with a small amount of water.  3) Must have a responsible person to drive you home.  4) Bring a current list of your  medications and current insurance cards.    If you have any questions after you get home, please call the office at [phone]   - Your physician has recommended that you have an atrial flutter ablation w/ Dr. Caryl Comes Nira Conn, Dr. Olin Pia nurse will call you to confirm the date/ time). Catheter ablation is a medical procedure used to treat some cardiac arrhythmias (irregular heartbeats). During catheter ablation, a long, thin, flexible tube is put into a blood vessel in your groin (upper thigh), or neck. This tube is called an ablation catheter. It is then guided to your heart through the blood vessel. Radio frequency waves destroy small areas of heart tissue where abnormal heartbeats may cause an arrhythmia to start. Please see the instruction sheet given to you today.   Follow-Up: - pending procedure dates  Any Additional Special Instructions Will Be Listed Below (If Applicable).     If you need a refill on your cardiac medications before your next appointment, please call your pharmacy.

## 2017-02-14 NOTE — Progress Notes (Signed)
ELECTROPHYSIOLOGY CONSULT NOTE  Patient ID: Harold Schwartz, MRN: [id number], DOB/AGE: 62/13/1957 62 y.o. Admit date: (Not on file) Date of Consult: 02/14/2017  Primary Physician: Webb Silversmith, NP Primary Cardiologist: AI Consulting Physician AI  Chief Complaint: aTRIAL FLUTTER   HPI Harold Schwartz is a 61 y.o. male  Referred for consideration of treatment options related to atrial flutter.  He has a history of tachypalpitations  first noted 2011. ECG  personally reviewed today Confirms atrial flutter-typical   Summer 2017 he had a couple of episodes of tachypalpitations which he thought were recurrences of his initial arrhythmia  He was seen by Dr. Orland Dec 02/12/17 with recurrent palpitations. ECG confirmed typical atrial flutter  8/17 echocardiogram normal  He has a history of a DVT. This became clear after developing abrupt onset swelling. He came to diagnosis for this some months later with proximal DVT clarified by duplex scanning 12/17. He has been treated only with aspirin until recently.  He has no history of bleeding diathesis. He does have a low platelet count which is chronic.  He had an episode of right foot persistent numbness that postdates his first atrial flutter episode. No other transient neurological episodes.  Exercise tolerance is quite good unless he is in atrial flutter. He denies chest pain shortness of breath or peripheral edema.  I should note been wearing compression stockings following the diagnosis of his DVT  Past Medical History:  Diagnosis Date  . Atrial flutter ([place])   . Colon polyps   . Depression   . Genital herpes   . Heart murmur   . Hemorrhoids   . Hyperlipidemia   . Hypogonadism in male       Surgical History:  Past Surgical History:  Procedure Laterality Date  . KNEE ARTHROSCOPY    . VASECTOMY  1990     Home Meds: Prior to Admission medications   Medication Sig Start Date End Date Taking? Authorizing Provider  apixaban (ELIQUIS)  5 MG TABS tablet Take 1 tablet (5 mg total) by mouth 2 (two) times daily. 02/12/17  Yes Wende Bushy, MD  ezetimibe (ZETIA) 10 MG tablet TAKE 1 TABLET (10 MG TOTAL) BY MOUTH DAILY. 12/20/16  Yes Jearld Fenton, NP  glucosamine-chondroitin 500-400 MG tablet Take 1 tablet by mouth 2 (two) times daily.   Yes Historical Provider, MD  metoprolol succinate (TOPROL-XL) 25 MG 24 hr tablet TAKE 1 TABLET (25 MG TOTAL) BY MOUTH 2 (TWO) TIMES DAILY. 08/28/16  Yes Jearld Fenton, NP  metoprolol tartrate (LOPRESSOR) 25 MG tablet Take 1 tablet (25 mg total) by mouth daily as needed. As needed for episode. 07/26/16 02/14/17 Yes Wende Bushy, MD  NON FORMULARY 2 capsules daily. Celery seed extract   Yes Historical Provider, MD  Omega-3 Fatty Acids (FISH OIL) 1200 MG CAPS Take 5 capsules by mouth daily.   Yes Historical Provider, MD  pramoxine (PROCTOFOAM) 1 % foam Place 1 application rectally 3 (three) times daily as needed for itching. 09/16/15  Yes Manus Gunning, MD  Testosterone 30 MG/ACT SOLN 1 APPLICATION UNDER EACH ARMPIT DAILY 10/16/16  Yes Jearld Fenton, NP  valACYclovir (VALTREX) 1000 MG tablet Take 1 tablet (1,000 mg total) by mouth daily. 04/25/15  Yes Jearld Fenton, NP    Allergies: No Known Allergies  Social History   Social History  . Marital status: Married    Spouse name: N/A  . Number of children: 2  . Years of education: N/A  Occupational History  . Engineer, site    Social History Main Topics  . Smoking status: Never Smoker  . Smokeless tobacco: Never Used  . Alcohol use No     Comment: moderate  . Drug use: No  . Sexual activity: Not on file   Other Topics Concern  . Not on file   Social History Narrative  . No narrative on file     Family History  Problem Relation Age of Onset  . Breast cancer Mother   . Heart disease Mother   . Hyperlipidemia Mother   . Hypertension Mother   . Colon cancer Father   . Heart disease Father   . Alzheimer's disease Father   .  Hyperlipidemia Father   . Hyperlipidemia Brother      ROS:  Please see the history of present illness.     All other systems reviewed and negative.    Physical Exam: Blood pressure 112/66, pulse 85, height 5\' 11"  (1.803 m), weight 212 lb 12 oz (96.5 kg). General: Well developed, well nourished male in no acute distress. Head: Normocephalic, atraumatic, sclera non-icteric, no xanthomas, nares are without discharge. EENT: normal  Lymph Nodes:  none Neck: Negative for carotid bruits. JVD not elevated.Flutter waves evident Back:without scoliosis kyphosis Lungs: Clear bilaterally to auscultation without wheezes, rales, or rhonchi. Breathing is unlabored. Heart: Irregular rate and rhythm with no significant murmur . No rubs, or gallops appreciated. Abdomen: Soft, non-tender, non-distended with normoactive bowel sounds. No hepatomegaly. No rebound/guarding. No obvious abdominal masses. Msk:  Strength and tone appear normal for age. Extremities: No clubbing or cyanosis. No* edema.  Distal pedal pulses are 2+ and equal bilaterally. Skin: Warm and Dry Neuro: Alert and oriented X 3. CN III-XII intact Grossly normal sensory and motor function . Psych:  Responds to questions appropriately with a normal affect.      Labs: Cardiac Enzymes No results for input(s): CKTOTAL, CKMB, TROPONINI in the last 72 hours. CBC Lab Results  Component Value Date   WBC 4.7 06/29/2016   HGB 15.1 06/29/2016   HCT 44.3 06/29/2016   MCV 89.3 06/29/2016   PLT 141.0 (L) 06/29/2016   PROTIME: No results for input(s): LABPROT, INR in the last 72 hours. Chemistry No results for input(s): NA, K, CL, CO2, BUN, CREATININE, CALCIUM, PROT, BILITOT, ALKPHOS, ALT, AST, GLUCOSE in the last 168 hours.  Invalid input(s): LABALBU Lipids Lab Results  Component Value Date   CHOL 176 06/29/2016   HDL 65.00 06/29/2016   LDLCALC 98 06/29/2016   TRIG 66.0 06/29/2016   BNP No results found for: PROBNP Thyroid Function  Tests: No results for input(s): TSH, T4TOTAL, T3FREE, THYROIDAB in the last 72 hours.  Invalid input(s): FREET3 Miscellaneous No results found for: DDIMER  Radiology/Studies:  No results found.  EKG:  Atrial flutter-typical  Assessment and Plan: Atrial flutter-typical  DVT-chronic  Right foot numbness question cause    His atrial flutter is symptomatic. I think ablation makes the most sense given his history of recurrence and this not withstanding the anticoagulation issues which will be outlined below. We have discussed risks and benefits and he is agreeable to proceeding.  Given his acute symptoms however, plan to proceed with TEE guided cardioversion so as to accelerate restoration of sinus rhythm and improved exercise tolerance.  We will plan catheter ablation to follow this will be undertaken WITHOUT INTERRUPTION of anticoagulation  We also discussed the risks and benefits of catheter ablation risks of recurrence of atrial flutter  and the increased risk of developing of atrial fibrillation    Normally, following catheter ablation anticoagulation is discontinued. However, there are 2 issues in his care that impact decisions regarding anticoagulation. #1 is his right foot numbness in fact related to his sciatica or was it related to a stroke. In the event that it were the latter then long-term anticoagulation would be indicated regardless of ablation status. MRI would be appropriate if there were to be a decision regarding elected discontinuation of anticoagulation  The more pressing issue relates to his DVT. While he has a long history of bilateral lower extremity swelling the acute episode last July of unilateral swelling and a subsequent identification of a large proximal DVT is indication for anticoagulation as opposed antiplatelet therapy. The duration of anticoagulation relates to whether it's thought to be provoked or not provoked. In discussions today with Dr. Beryle Beams 8  hour duration which was about his time is thought to be potentially provoked but that these patients are frequently obtained into an intermediate risk group with long-term half dose anticoagulation. It seems reasonable at this juncture to anticipate 6-12 months of anticoagulation-full dose with subsequent reduction in dose.  We will check a metabolic profile and a CBC today.   Virl Axe \

## 2017-02-15 ENCOUNTER — Other Ambulatory Visit: Payer: Self-pay | Admitting: Internal Medicine

## 2017-02-15 ENCOUNTER — Encounter: Payer: Self-pay | Admitting: Internal Medicine

## 2017-02-15 DIAGNOSIS — I483 Typical atrial flutter: Secondary | ICD-10-CM

## 2017-02-15 LAB — CBC WITH DIFFERENTIAL/PLATELET
BASOS: 0 %
Basophils Absolute: 0 10*3/uL (ref 0.0–0.2)
EOS (ABSOLUTE): 0.2 10*3/uL (ref 0.0–0.4)
Eos: 3 %
Hematocrit: 48 % (ref 37.5–51.0)
Hemoglobin: 16.6 g/dL (ref 13.0–17.7)
IMMATURE GRANULOCYTES: 0 %
Immature Grans (Abs): 0 10*3/uL (ref 0.0–0.1)
Lymphocytes Absolute: 2.3 10*3/uL (ref 0.7–3.1)
Lymphs: 41 %
MCH: 31.3 pg (ref 26.6–33.0)
MCHC: 34.6 g/dL (ref 31.5–35.7)
MCV: 91 fL (ref 79–97)
MONOS ABS: 0.5 10*3/uL (ref 0.1–0.9)
Monocytes: 8 %
NEUTROS PCT: 48 %
Neutrophils Absolute: 2.7 10*3/uL (ref 1.4–7.0)
PLATELETS: 153 10*3/uL (ref 150–379)
RBC: 5.3 x10E6/uL (ref 4.14–5.80)
RDW: 14.2 % (ref 12.3–15.4)
WBC: 5.6 10*3/uL (ref 3.4–10.8)

## 2017-02-15 LAB — BASIC METABOLIC PANEL
BUN/Creatinine Ratio: 19 (ref 10–24)
BUN: 22 mg/dL (ref 8–27)
CO2: 22 mmol/L (ref 18–29)
Calcium: 9.7 mg/dL (ref 8.6–10.2)
Chloride: 98 mmol/L (ref 96–106)
Creatinine, Ser: 1.17 mg/dL (ref 0.76–1.27)
GFR calc Af Amer: 77 mL/min/{1.73_m2} (ref >59)
GFR, EST NON AFRICAN AMERICAN: 66 mL/min/{1.73_m2} (ref >59)
GLUCOSE: 86 mg/dL (ref 65–99)
POTASSIUM: 4.6 mmol/L (ref 3.5–5.2)
SODIUM: 138 mmol/L (ref 134–144)

## 2017-02-18 ENCOUNTER — Telehealth: Payer: Self-pay | Admitting: Internal Medicine

## 2017-02-18 ENCOUNTER — Encounter: Payer: Self-pay | Admitting: *Deleted

## 2017-02-18 ENCOUNTER — Other Ambulatory Visit: Payer: Self-pay | Admitting: Cardiology

## 2017-02-18 ENCOUNTER — Ambulatory Visit (INDEPENDENT_AMBULATORY_CARE_PROVIDER_SITE_OTHER): Payer: BC Managed Care – PPO | Admitting: *Deleted

## 2017-02-18 VITALS — BP 110/75 | HR 64 | Ht 71.0 in | Wt 210.8 lb

## 2017-02-18 DIAGNOSIS — I4892 Unspecified atrial flutter: Secondary | ICD-10-CM | POA: Diagnosis not present

## 2017-02-18 NOTE — Telephone Encounter (Signed)
Spoke with patient who states he would like to cancel TEE/DCCV tomorrow because he believes he is in sinus rhythm. He states he believes he converted on Saturday evening and heart rate has been 55-60 bpm since that time. He states he walked 4 miles without difficulty yesterday and feels well today. He is scheduled for a-flutter ablation on 4/6 and is currently on eliquis.  I advised that Dr. Caryl Comes is working in the hospital today and that I will get message to him and call back with his advice. Patient verbalized understanding and agreement with plan.

## 2017-02-18 NOTE — Telephone Encounter (Signed)
Left message for patient to call back  

## 2017-02-18 NOTE — Telephone Encounter (Signed)
Patient called back and states he can be in [place] office around 3:30 today. I advised him that I will notify the [place] office. He thanked me for the update.

## 2017-02-18 NOTE — Telephone Encounter (Signed)
New message   Pt verbalized that he wants to cancel the procedure tomorrow with Dr.Klein because his heart is back in rhythm

## 2017-02-18 NOTE — Patient Instructions (Signed)
1.) Reason for visit: EKG check; patient has TEE/DCCV scheduled for tomorrow, 02/19/17.  2.) Name of MD requesting visit: Dr Lovena Le via triage call   3.) H&P: hx atrial flutter; patient for TEE/DCCV 02/19/17.  4.) ROS related to problem: Patient called into triage today and said he felt his heart was back in normal rhythm over the weekend. Denies SOB, CP, Palpitations and has no other complaints today.  5.) Assessment and plan per MD: EKG performed. EKG shown to Ignacia Bayley, NP and EKG in sinus rhythm. Per Ignacia Bayley, NP advised TEE/DCCV is not needed. Procedure cancelled. Patient notified and will call us with any further problems if needed.

## 2017-02-18 NOTE — Telephone Encounter (Signed)
Follow Up

## 2017-02-18 NOTE — Telephone Encounter (Signed)
Spoke with Dr. Lovena Le about patient due to Dr. Caryl Comes is unavailable. He advised patient have EKG today to determine if procedure needs to be cancelled. I spoke with Olivia Mackie at [place] office and she advised the patient can come in at 3 today for EKG. I left message for patient to call back.

## 2017-02-19 ENCOUNTER — Encounter: Payer: Self-pay | Admitting: Internal Medicine

## 2017-02-19 ENCOUNTER — Encounter: Admission: RE | Payer: Self-pay | Source: Ambulatory Visit

## 2017-02-19 ENCOUNTER — Ambulatory Visit
Admission: RE | Admit: 2017-02-19 | Payer: BC Managed Care – PPO | Source: Ambulatory Visit | Admitting: Internal Medicine

## 2017-02-19 SURGERY — ECHOCARDIOGRAM, TRANSESOPHAGEAL
Anesthesia: General

## 2017-02-21 ENCOUNTER — Other Ambulatory Visit: Payer: Self-pay | Admitting: Internal Medicine

## 2017-02-21 DIAGNOSIS — E349 Endocrine disorder, unspecified: Secondary | ICD-10-CM

## 2017-02-21 NOTE — Telephone Encounter (Signed)
RX printed and signed and placed in MYD box 

## 2017-02-21 NOTE — Telephone Encounter (Signed)
Last filled 10/16/16 with refills--please advise

## 2017-02-22 NOTE — Telephone Encounter (Signed)
Rx faxed to pharmacy  

## 2017-03-04 ENCOUNTER — Encounter: Payer: Self-pay | Admitting: Oncology

## 2017-03-04 ENCOUNTER — Ambulatory Visit (INDEPENDENT_AMBULATORY_CARE_PROVIDER_SITE_OTHER): Payer: BC Managed Care – PPO | Admitting: Oncology

## 2017-03-04 ENCOUNTER — Encounter: Payer: Self-pay | Admitting: Internal Medicine

## 2017-03-04 VITALS — BP 132/90 | HR 62 | Temp 98.0°F | Ht 71.0 in | Wt 210.6 lb

## 2017-03-04 DIAGNOSIS — Z86010 Personal history of colonic polyps: Secondary | ICD-10-CM

## 2017-03-04 DIAGNOSIS — Z803 Family history of malignant neoplasm of breast: Secondary | ICD-10-CM | POA: Diagnosis not present

## 2017-03-04 DIAGNOSIS — Z820 Family history of epilepsy and other diseases of the nervous system: Secondary | ICD-10-CM

## 2017-03-04 DIAGNOSIS — Z8249 Family history of ischemic heart disease and other diseases of the circulatory system: Secondary | ICD-10-CM | POA: Diagnosis not present

## 2017-03-04 DIAGNOSIS — Z8349 Family history of other endocrine, nutritional and metabolic diseases: Secondary | ICD-10-CM

## 2017-03-04 DIAGNOSIS — Z800 Family history of malignant neoplasm of digestive organs: Secondary | ICD-10-CM | POA: Diagnosis not present

## 2017-03-04 DIAGNOSIS — I825Y2 Chronic embolism and thrombosis of unspecified deep veins of left proximal lower extremity: Secondary | ICD-10-CM

## 2017-03-04 LAB — D-DIMER, QUANTITATIVE: D-Dimer, Quant: <0.27 ug/mL-FEU (ref 0.00–0.50)

## 2017-03-04 NOTE — Patient Instructions (Addendum)
To lab today Return as needed

## 2017-03-04 NOTE — Progress Notes (Signed)
New Patient Hematology   Phoenix Dresser [id number] 1956-11-10 61 y.o. 03/04/2017  CC: Dr. Adam Phenix; Dr. 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  Back in June 2017 he did almost constant traveling throughout [place], [place] over an interval of 10 days.  He started to experience discomfort in his left calf.  He did not really think much of it but at the advice of his family physician he did get a venous Doppler study of his left lower extremity but not until 6 months later on May 21, 2016.  Result was: chronic appearing clot in the middle and distal femoral vein with nearly occlusive clot in the popliteal vein extending into the gastrocs veins which was not occlusive.  He was put on aspirin at that time.  He had no signs or symptoms of a postphlebitic syndrome. He was recently evaluated by cardiology.  He has had palpitations on and off for many years.  Atrial arrhythmias had not been documented until an EKG done on February 12, 2017 showed atrial flutter.  He has a chads 2 vasc2 score of 0.  He was started on Eliquis 5 mg twice daily.  Initial plan was for DC cardioversion but he reverted spontaneously into sinus rhythm.  He is scheduled for a radiofrequency ablation procedure in April. He has no obvious risk factors for thrombosis other than the prolonged travel.  He is a non-smoker.  He has no signs or symptoms of a collagen vascular disorder.  He has no constitutional symptoms.  He is up-to-date on his health maintenance.  He had a colonoscopy in February 2014.  A few small benign polyps were removed.  A chest x-ray done December 04, 2015 was normal. There is no family history of blood clots.  Father died his mother died at age 61.  She was diagnosed with breast cancer at aged 19 and  was treated at that time.  Relapsed breast cancer the reason why she expired.  Father had colon cancer which did not result in his death which occurred at age 42.  He has a 41 year old sister who is healthy.  A 48 year old brother who is obese and has venous insufficiency and has had a number of "ablations".  A 83 year old daughter and a 58 year old son are healthy.    PMH: Past Medical History:  Diagnosis Date  . Atrial flutter ([place])   . Colon polyps   . Depression   . Genital herpes   . Heart murmur   . Hemorrhoids   . Hyperlipidemia   . Hypogonadism in male     Past Surgical History:  Procedure Laterality Date  . KNEE ARTHROSCOPY    . VASECTOMY  1990    Allergies: No Known Allergies  Medications:  Current Outpatient Prescriptions:  .  apixaban (ELIQUIS) 5 MG TABS tablet, Take 1 tablet (5 mg total) by mouth 2 (two) times daily., Disp: 60 tablet, Rfl: 4 .  ezetimibe (ZETIA) 10 MG tablet, TAKE 1 TABLET (10 MG TOTAL) BY MOUTH DAILY., Disp: 90 tablet, Rfl: 0 .  glucosamine-chondroitin 500-400 MG tablet, Take 1 tablet by mouth 2 (two) times daily., Disp: , Rfl:  .  metoprolol succinate (TOPROL-XL) 25 MG 24 hr tablet, TAKE 1 TABLET (25 MG TOTAL) BY MOUTH 2 (TWO) TIMES DAILY. (Patient taking differently: Take 50mg s  daily in the evening), Disp: 180 tablet, Rfl: 1 .  metoprolol tartrate (LOPRESSOR) 25 MG tablet, Take 1 tablet (25 mg total) by mouth daily as needed. As needed for episode. (Patient taking differently: Take 25 mg by mouth daily as needed (elevated heart rate). If heart rate is over 120 bpm for an extended period of time), Disp: 30 tablet, Rfl: 6 .  naproxen sodium (ANAPROX) 220 MG tablet, Take 220 mg by mouth daily as needed (pain)., Disp: , Rfl:  .  NON FORMULARY, Take 1 capsule by mouth 2 (two) times daily. Celery seed extract , Disp: , Rfl:  .  Omega-3 Fatty Acids (FISH OIL) 1200 MG CAPS, Take 2,400-3,600 capsules by mouth daily. 3600 mg in the morning and 2400mg  in the  evening, Disp: , Rfl:  .  Testosterone 30 MG/ACT SOLN, PLACE 1 PUMP UNDER EACH ARMPIT ONCE DAILY, Disp: 90 mL, Rfl: 5 .  valACYclovir (VALTREX) 1000 MG tablet, Take 1 tablet (1,000 mg total) by mouth daily. (Patient taking differently: Take 500 mg by mouth every evening. 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  reports that he has never smoked. He has never used smokeless tobacco. He reports that he drinks about 8.4 oz of alcohol per week .  Usually whiskey.  He does not use drugs.  Family History: Family History  Problem Relation Age of Onset  . Breast cancer Mother   . Heart disease Mother   . Hyperlipidemia Mother   . Hypertension Mother   . Colon cancer Father   . Heart disease Father   . Alzheimer's disease Father   . Hyperlipidemia Father   . Hyperlipidemia Brother     Review of Systems: See HPI Remaining ROS negative.  Physical Exam: Blood pressure 132/90, pulse 62, temperature 98 F (36.7 C), temperature source Oral, height 5\' 11"  (1.803 m), weight 210 lb 9.6 oz (95.5 kg), SpO2 100 %. Wt Readings from Last 3 Encounters:  03/04/17 210 lb 9.6 oz (95.5 kg)  02/18/17 210 lb 12 oz (95.6 kg)  02/14/17 212 lb 12 oz (96.5 kg)     General appearance: Healthy appearing Caucasian man HENNT: Pharynx no erythema, exudate, mass, or ulcer. No thyromegaly or thyroid nodules Lymph nodes: No cervical, supraclavicular, or axillary lymphadenopathy Breasts: Lungs: Clear to auscultation, resonant to percussion throughout Heart: Regular rhythm, no murmur, no gallop, no rub, no click, no edema Abdomen: Soft, nontender, normal bowel sounds, no mass, no organomegaly Extremities: No edema, no calf tenderness left calf 41 cm, right 39 cm. Ankle measurements: Left 23.5 cm, right 23.5 cm. Musculoskeletal: no joint deformities GU: Vascular:  Carotid pulses 2+, no bruits, Calf measurements: Neurologic: Alert, oriented, PERRLA, optic discs sharp and vessels normal, no hemorrhage or exudate, cranial nerves grossly normal, motor strength 5 over 5, reflexes 1+ symmetric, upper body coordination normal, gait normal, Skin: No rash or ecchymosis    Lab Results: Lab Results  Component Value Date   WBC 5.6 02/14/2017   HGB 15.1 06/29/2016   HCT 48.0 02/14/2017   MCV 91 02/14/2017   PLT 153 02/14/2017     Chemistry      Component Value Date/Time   NA 138 02/14/2017 1249   K 4.6 02/14/2017 1249   CL 98 02/14/2017 1249   CO2 22 02/14/2017 1249   BUN 22 02/14/2017 1249  CREATININE 1.17 02/14/2017 1249      Component Value Date/Time   CALCIUM 9.7 02/14/2017 1249   ALKPHOS 49 06/29/2016 0819   AST 17 06/29/2016 0819   ALT 15 06/29/2016 0819   BILITOT 0.7 06/29/2016 8325     Radiological Studies: See discussion above  Impression: Left lower extremity proximal DVT occurring at age 74 following prolonged travel and likely a provoked event in the absence of any other obvious risk factors and a negative family history.    Recommendation: I do not feel that he needs long-term anticoagulation and he can stop his Eliquis when otherwise advised by cardiology after his ablation procedure. He does not need sophisticated hematology laboratory testing since it will not change my clinical recommendation. He does not need a follow-up venous Doppler study of his left leg.  He has not developed a postphlebitic syndrome.  Although complete resolution of Doppler changes has a high negative predictive value for subsequent clot, residual clot on a Doppler study in someone who is otherwise clinically stable is not helpful in risk stratification. I am going to check a d-dimer since this has been useful and helping to determine duration of anticoagulation if it remains elevated.     Murriel Hopper, MD, [place]  Hematology-Oncology/Internal  Medicine  03/04/2017, 4:34 PM

## 2017-03-05 ENCOUNTER — Telehealth: Payer: Self-pay | Admitting: *Deleted

## 2017-03-05 NOTE — Telephone Encounter (Signed)
-----   Message from Annia Belt, MD sent at 03/04/2017  5:26 PM EDT ----- Call pt: d-dimer clot digestion test:  level undetectable. No reason to repeat the doppler study.

## 2017-03-05 NOTE — Telephone Encounter (Signed)
Pt called / informed "d-dimer clot digestion test: level undetectable. No reason to repeat the doppler study." per DR Beryle Beams. Voiced understanding' "thanks for calling".

## 2017-03-07 ENCOUNTER — Ambulatory Visit: Payer: BC Managed Care – PPO | Admitting: Cardiology

## 2017-03-07 ENCOUNTER — Encounter: Payer: Self-pay | Admitting: *Deleted

## 2017-03-13 ENCOUNTER — Encounter (INDEPENDENT_AMBULATORY_CARE_PROVIDER_SITE_OTHER): Payer: BC Managed Care – PPO

## 2017-03-13 ENCOUNTER — Ambulatory Visit (INDEPENDENT_AMBULATORY_CARE_PROVIDER_SITE_OTHER): Payer: BC Managed Care – PPO | Admitting: Vascular Surgery

## 2017-03-18 ENCOUNTER — Other Ambulatory Visit: Payer: Self-pay | Admitting: Internal Medicine

## 2017-03-20 ENCOUNTER — Ambulatory Visit (INDEPENDENT_AMBULATORY_CARE_PROVIDER_SITE_OTHER): Payer: BC Managed Care – PPO

## 2017-03-20 ENCOUNTER — Other Ambulatory Visit: Payer: Self-pay | Admitting: *Deleted

## 2017-03-20 ENCOUNTER — Other Ambulatory Visit: Payer: Self-pay

## 2017-03-20 ENCOUNTER — Other Ambulatory Visit (INDEPENDENT_AMBULATORY_CARE_PROVIDER_SITE_OTHER): Payer: BC Managed Care – PPO

## 2017-03-20 VITALS — BP 120/78 | HR 64

## 2017-03-20 DIAGNOSIS — I482 Chronic atrial fibrillation, unspecified: Secondary | ICD-10-CM

## 2017-03-20 DIAGNOSIS — I4892 Unspecified atrial flutter: Secondary | ICD-10-CM | POA: Diagnosis not present

## 2017-03-20 DIAGNOSIS — Z01818 Encounter for other preprocedural examination: Secondary | ICD-10-CM

## 2017-03-20 NOTE — Patient Instructions (Addendum)
1.) Reason for visit: EKG  2.) Name of MD requesting visit: Dr. Caryl Comes  3.) H&P: Pt has hx of afib/flutter. Scheduled to undergo ablation April 6.   4.) ROS related to problem: Pt scheduled for 3/6 TEE/DCCV for afib. Per March 5 EKG, pt in NSR. Procedure cancelled but will proceed with ablation as pt has a long hx of afib. He reports feeling well, no SOB and biked in [place] this weekend with no issues.   5.) Assessment and plan per MD: EKG today NSR w/sinus arrhythmias. Reviewed by Dr. Saunders Revel. Scanned in to patient's chart. Dr. Caryl Comes and Alvis Lemmings, RN notified.

## 2017-03-21 ENCOUNTER — Telehealth: Payer: Self-pay | Admitting: Internal Medicine

## 2017-03-21 ENCOUNTER — Other Ambulatory Visit: Payer: Self-pay | Admitting: Internal Medicine

## 2017-03-21 LAB — BASIC METABOLIC PANEL
BUN/Creatinine Ratio: 18 (ref 10–24)
BUN: 18 mg/dL (ref 8–27)
CALCIUM: 9 mg/dL (ref 8.6–10.2)
CO2: 20 mmol/L (ref 18–29)
CREATININE: 1.01 mg/dL (ref 0.76–1.27)
Chloride: 102 mmol/L (ref 96–106)
GFR calc Af Amer: 92 mL/min/{1.73_m2} (ref >59)
GFR, EST NON AFRICAN AMERICAN: 79 mL/min/{1.73_m2} (ref >59)
GLUCOSE: 93 mg/dL (ref 65–99)
POTASSIUM: 4.5 mmol/L (ref 3.5–5.2)
Sodium: 139 mmol/L (ref 134–144)

## 2017-03-21 LAB — CBC WITH DIFFERENTIAL/PLATELET
BASOS ABS: 0 10*3/uL (ref 0.0–0.2)
Basos: 1 %
EOS (ABSOLUTE): 0.1 10*3/uL (ref 0.0–0.4)
EOS: 4 %
HEMATOCRIT: 44 % (ref 37.5–51.0)
HEMOGLOBIN: 14.9 g/dL (ref 13.0–17.7)
IMMATURE GRANULOCYTES: 0 %
Immature Grans (Abs): 0 10*3/uL (ref 0.0–0.1)
Lymphocytes Absolute: 1.5 10*3/uL (ref 0.7–3.1)
Lymphs: 44 %
MCH: 30 pg (ref 26.6–33.0)
MCHC: 33.9 g/dL (ref 31.5–35.7)
MCV: 89 fL (ref 79–97)
MONOCYTES: 8 %
Monocytes Absolute: 0.3 10*3/uL (ref 0.1–0.9)
NEUTROS PCT: 43 %
Neutrophils Absolute: 1.5 10*3/uL (ref 1.4–7.0)
Platelets: 149 10*3/uL — ABNORMAL LOW (ref 150–379)
RBC: 4.96 x10E6/uL (ref 4.14–5.80)
RDW: 14.3 % (ref 12.3–15.4)
WBC: 3.4 10*3/uL (ref 3.4–10.8)

## 2017-03-21 NOTE — Telephone Encounter (Signed)
Lmov for patient to call back and schedule follow up from 03/22/17 (about 4w) with Dr Caryl Comes Will try again at a later time

## 2017-03-21 NOTE — Telephone Encounter (Signed)
-----   Message from Emily Filbert, RN sent at 03/20/2017  9:27 AM EDT ----- Please call the patient to arrange follow up with Dr. Caryl Comes 4 weeks (from 03/22/17) ablation  Thanks!

## 2017-03-22 ENCOUNTER — Encounter (HOSPITAL_COMMUNITY)
Admission: RE | Disposition: A | Discharge status: Home / Self Care | Payer: Self-pay | Source: Ambulatory Visit | Attending: Internal Medicine

## 2017-03-22 ENCOUNTER — Encounter (HOSPITAL_COMMUNITY): Payer: Self-pay | Admitting: Certified Registered Nurse Anesthetist

## 2017-03-22 ENCOUNTER — Ambulatory Visit (HOSPITAL_COMMUNITY): Payer: BC Managed Care – PPO | Admitting: Certified Registered Nurse Anesthetist

## 2017-03-22 ENCOUNTER — Ambulatory Visit (HOSPITAL_COMMUNITY)
Admission: RE | Admit: 2017-03-22 | Discharge: 2017-03-22 | Disposition: A | Discharge status: Home / Self Care | Payer: BC Managed Care – PPO | Source: Ambulatory Visit | Attending: Internal Medicine | Admitting: Internal Medicine

## 2017-03-22 DIAGNOSIS — I483 Typical atrial flutter: Secondary | ICD-10-CM | POA: Diagnosis not present

## 2017-03-22 DIAGNOSIS — F329 Major depressive disorder, single episode, unspecified: Secondary | ICD-10-CM | POA: Diagnosis not present

## 2017-03-22 DIAGNOSIS — I4892 Unspecified atrial flutter: Secondary | ICD-10-CM | POA: Diagnosis present

## 2017-03-22 DIAGNOSIS — E785 Hyperlipidemia, unspecified: Secondary | ICD-10-CM | POA: Diagnosis not present

## 2017-03-22 DIAGNOSIS — Z86718 Personal history of other venous thrombosis and embolism: Secondary | ICD-10-CM | POA: Diagnosis not present

## 2017-03-22 HISTORY — PX: A-FLUTTER ABLATION: EP1230

## 2017-03-22 SURGERY — A-FLUTTER ABLATION
Anesthesia: General

## 2017-03-22 MED ORDER — BUPIVACAINE HCL (PF) 0.25 % IJ SOLN
INTRAMUSCULAR | Status: AC
  Filled 2017-03-22: qty 60

## 2017-03-22 MED ORDER — SODIUM CHLORIDE 0.9% FLUSH: 3.0000 mL | Freq: Two times a day (BID) | INTRAVENOUS | Status: DC

## 2017-03-22 MED ORDER — ONDANSETRON HCL 4 MG/2ML IJ SOLN: 4.0000 mg | Freq: Once | INTRAMUSCULAR | Status: DC | PRN

## 2017-03-22 MED ORDER — FENTANYL CITRATE (PF) 100 MCG/2ML IJ SOLN
25.0000 ug | INTRAMUSCULAR | Status: DC | PRN
  Administered 2017-03-22: 25 ug via INTRAVENOUS

## 2017-03-22 MED ORDER — HEPARIN (PORCINE) IN NACL 2-0.9 UNIT/ML-% IJ SOLN
INTRAMUSCULAR | Status: DC | PRN
  Administered 2017-03-22: 1000 mL

## 2017-03-22 MED ORDER — FENTANYL CITRATE (PF) 100 MCG/2ML IJ SOLN
INTRAMUSCULAR | Status: AC
  Filled 2017-03-22: qty 2

## 2017-03-22 MED ORDER — SODIUM CHLORIDE 0.9% FLUSH: 3.0000 mL | INTRAVENOUS | Status: DC | PRN

## 2017-03-22 MED ORDER — PROPOFOL 10 MG/ML IV BOLUS
INTRAVENOUS | Status: DC | PRN
  Administered 2017-03-22: 200 mg via INTRAVENOUS

## 2017-03-22 MED ORDER — FENTANYL CITRATE (PF) 100 MCG/2ML IJ SOLN
INTRAMUSCULAR | Status: DC | PRN
  Administered 2017-03-22 (×2): 25 ug via INTRAVENOUS
  Administered 2017-03-22: 50 ug via INTRAVENOUS

## 2017-03-22 MED ORDER — LIDOCAINE 2% (20 MG/ML) 5 ML SYRINGE
INTRAMUSCULAR | Status: DC | PRN
  Administered 2017-03-22: 100 mg via INTRAVENOUS

## 2017-03-22 MED ORDER — MIDAZOLAM HCL 5 MG/5ML IJ SOLN
INTRAMUSCULAR | Status: DC | PRN
  Administered 2017-03-22: 2 mg via INTRAVENOUS

## 2017-03-22 MED ORDER — SODIUM CHLORIDE 0.9 % IV SOLN: 250.0000 mL | INTRAVENOUS | Status: DC | PRN

## 2017-03-22 MED ORDER — ONDANSETRON HCL 4 MG/2ML IJ SOLN
INTRAMUSCULAR | Status: DC | PRN
  Administered 2017-03-22: 4 mg via INTRAVENOUS

## 2017-03-22 MED ORDER — BUPIVACAINE HCL (PF) 0.25 % IJ SOLN
INTRAMUSCULAR | Status: DC | PRN
Start: 2017-03-22 — End: 2017-03-22
  Administered 2017-03-22: 50 mL

## 2017-03-22 MED ORDER — SODIUM CHLORIDE 0.9 % IV SOLN
INTRAVENOUS | Status: DC
  Administered 2017-03-22: 07:00:00 via INTRAVENOUS

## 2017-03-22 SURGICAL SUPPLY — 13 items
BAG SNAP BAND KOVER 36X36 (MISCELLANEOUS) IMPLANT
CATH BLAZERPRIME XP LG CV 10MM (ABLATOR) ×2 IMPLANT
CATH DUODECA/ISMUS 7FR REPROC (CATHETERS) ×2 IMPLANT
CATH OCTAPOLOR 6F 125CM 2-5-2 (CATHETERS) ×2 IMPLANT
CATH QUAD COURNAND 5FR (CATHETERS) ×2 IMPLANT
PACK EP LATEX FREE (CUSTOM PROCEDURE TRAY) ×1
PACK EP LF (CUSTOM PROCEDURE TRAY) ×1 IMPLANT
PAD DEFIB LIFELINK (PAD) ×2 IMPLANT
SHEATH ATRIAL FLUTTER SAFL 8F (SHEATH) ×2 IMPLANT
SHEATH PINNACLE 6F 10CM (SHEATH) ×2 IMPLANT
SHEATH PINNACLE 7F 10CM (SHEATH) ×2 IMPLANT
SHEATH PINNACLE 8F 10CM (SHEATH) ×4 IMPLANT
SHIELD RADPAD SCOOP 12X17 (MISCELLANEOUS) ×2 IMPLANT

## 2017-03-22 NOTE — Op Note (Deleted)
  The note originally documented on this encounter has been moved the the encounter in which it belongs.  

## 2017-03-22 NOTE — Op Note (Signed)
NAMEMarland Kitchen  GUSTIN, ZOBRIST               ACCOUNT NO.:  [id number]  MEDICAL RECORD NO.:  [id number]  LOCATION:                                 FACILITY:  PHYSICIAN:  Deboraha Sprang, MD, FACCDATE OF BIRTH:  January 15, 1956  DATE OF PROCEDURE:  03/22/2017 DATE OF DISCHARGE:  03/22/2017                              OPERATIVE REPORT   PREOPERATIVE DIAGNOSIS:  Atrial flutter - typical.  POSTOPERATIVE DIAGNOSIS:  Atrial flutter - typical.  PROCEDURE:  Invasive electrophysiological study, arrhythmia mapping, and catheter ablation.  DESCRIPTION OF PROCEDURE:  Following obtaining informed consent, the patient was brought to electrophysiology laboratory and placed on the fluoroscopic table in a supine position.  After sedation to general anesthesia and routine prep and drape, the patient underwent catheterization with adjunctive local anesthesia.  Following the procedure, the catheter was removed.  The sheaths were left in place and the patient was transferred to holding area in stable condition.  Catheters are 5-French quadripolar catheter was inserted via the left femoral vein to the AV junction.  A 6-French octapolar catheter was inserted the right femoral vein to the coronary sinus.  An 8-French dual decapolar catheter was inserted via left femoral vein to the tricuspid annulus.  An 8-French 10 mm deflectable tip ablation catheter was inserted via the right femoral vein to mapping sites in the posterior septal space.  Surface leads:  I, aVF, and V1 were monitored continuously throughout the procedure.  Following insertion of the catheters, a stimulation protocol included: 1. Incremental atrial pacing. 2. Incremental ventricular pacing. 3. Single atrial extrastimuli at paced cycle length of 600     milliseconds.  END-TIDAL RESULTS:  End-tidal baseline intervals and intracardiac measurements:  Rhythm:  Sinus; RR interval 944 milliseconds; PR interval 166 milliseconds; P-wave duration  114 milliseconds; QRS duration 91 milliseconds; QT interval 377 milliseconds; AH interval 77 milliseconds; HV interval 50 milliseconds.  Final: Rhythm:  Sinus; RR interval 855 milliseconds; PR interval 155 milliseconds; P-wave duration 103 milliseconds; QRS duration 90 milliseconds; QT interval 411 milliseconds; AH interval 59 milliseconds; and HV interval 49 milliseconds.  AV NODAL FUNCTION:  AV nodal effective refractory period at 600 milliseconds was 300 milliseconds.  VA conduction was associated and had decremental conduction with Wenckebach at 450 milliseconds.  Av nodal conduction was continuous.  ACCESSORY PATHWAY FUNCTION:  No evidence of accessory pathway was identified.  Ventricular response to programmed stimulation was normal as described above.  Arrhythmias induced the patient developed spontaneous typical atrial flutter.  Electrogram mapping confirmed counterclockwise rotation. Catheter ablation terminated the atrial flutter.  FLUOROSCOPY TIME:  A total of 6.3 minutes of fluoroscopy time was utilized.  RF ENERGY:  A total of 7.1 minute of RF was applied over 11 applications to the cavotricuspid isthmus.  Initially double electrograms were noted, the patient developed atrial flutter.  Atrial flutter terminated with RF with subsequent development and demonstration of bidirectional cavotricuspid isthmus conduction block.  IMPRESSION: 1. Normal sinus function. 2. Abnormal atrial function, manifested by sustained atrial flutter. 3. Normal AV nodal function. 4. Normal His-Purkinje system function. 5. No accessory pathway. 6. Normal ventricular response programmed stimulation.  SUMMARY:  In conclusion, the results of electrophysiological testing  and arrhythmia induction confirmed cavotricuspid isthmus counterclockwise dependent flutter.  RF energy across the cavotricuspid isthmus successfully interrupted the tachycardia and eliminating the  patient's substrate.  The patient will be observed and anticipated discharge later this afternoon.     Deboraha Sprang, MD, FACC   ______________________________ Deboraha Sprang, MD, [facility]    SCK/MEDQ  D:  03/22/2017  T:  03/22/2017  Job:  [id number]

## 2017-03-22 NOTE — H&P (Signed)
      Patient Care Team: Jearld Fenton, NP as PCP - General (Internal Medicine)   HPI  Harold Schwartz is a 61 y.o. male For RFCA of atrial flutter    He has a history of tachypalpitations  first noted 2011. ECG  Confirmed atrial flutter-typical   Summer 2017 he had a couple of episodes of tachypalpitations which he thought were recurrences of his initial arrhythmia  He was seen by Dr. Orland Dec 02/12/17 with recurrent palpitations. ECG confirmed typical atrial flutter  8/17 echocardiogram normal  Hx of DVT  Saw Dr Gaylan Gerold  Will continue apixoban post procedure for a week    Past Medical History:  Diagnosis Date  . Atrial flutter ([place])   . Colon polyps   . Depression   . Genital herpes   . Heart murmur   . Hemorrhoids   . Hyperlipidemia   . Hypogonadism in male     Past Surgical History:  Procedure Laterality Date  . KNEE ARTHROSCOPY    . VASECTOMY  1990    Current Facility-Administered Medications  Medication Dose Route Frequency Provider Last Rate Last Dose  . 0.9 %  sodium chloride infusion   Intravenous Continuous Deboraha Sprang, MD        No Known Allergies    Review of Systems negative except from HPI and PMH  Physical Exam BP 122/83   Pulse (!) 55   Temp 97.5 F (36.4 C) (Oral)   Resp 18   Ht 5\' 11"  (1.803 m)   Wt 208 lb (94.3 kg)   SpO2 98%   BMI 29.01 kg/m  Well developed and well nourished in no acute distress HENT normal E scleral and icterus clear Neck Supple JVP flat; carotids brisk and full Clear to ausculation  Regular rate and rhythm, no murmurs gallops or rub Soft with active bowel sounds No clubbing cyanosis  Edema Alert and oriented, grossly normal motor and sensory function Skin Warm and Dry  ECG 4/4 Sinuis  Assessment and  Plan Atrial flutter-typical  DVT-chronic  For RFCA today for atrial flutter  Questions answered  revioewed with dr Gaylan Gerold  Will not need post procedure apixoban     Current medicines  are reviewed at length with the patient today .  The patient does not  have concerns regarding medicines.

## 2017-03-22 NOTE — Anesthesia Postprocedure Evaluation (Addendum)
Anesthesia Post Note  Patient: Harold Schwartz  Procedure(s) Performed: Procedure(s) (LRB): A-Flutter Ablation (N/A)  Patient location during evaluation: Cath Lab Anesthesia Type: General Level of consciousness: awake and sedated Pain management: pain level controlled Vital Signs Assessment: post-procedure vital signs reviewed and stable Respiratory status: spontaneous breathing Cardiovascular status: stable Postop Assessment: no signs of nausea or vomiting Anesthetic complications: no        Last Vitals:  Vitals:   03/22/17 0544 03/22/17 0915  BP: 122/83   Pulse: (!) 55   Resp: 18   Temp: 36.4 C 36.6 C    Last Pain:  Vitals:   03/22/17 0544  TempSrc: Oral   Pain Goal:                 Harold Schwartz,Harold Schwartz

## 2017-03-22 NOTE — Anesthesia Procedure Notes (Signed)
Procedure Name: LMA Insertion Date/Time: 03/22/2017 7:47 AM Performed by: Candis Shine Pre-anesthesia Checklist: Patient identified, Emergency Drugs available, Suction available and Patient being monitored Patient Re-evaluated:Patient Re-evaluated prior to inductionOxygen Delivery Method: Circle System Utilized Preoxygenation: Pre-oxygenation with 100% oxygen Intubation Type: IV induction Ventilation: Mask ventilation without difficulty LMA: LMA inserted LMA Size: 5.0 Number of attempts: 1 Airway Equipment and Method: Bite block Placement Confirmation: positive ETCO2 Tube secured with: Tape Dental Injury: Teeth and Oropharynx as per pre-operative assessment

## 2017-03-22 NOTE — Progress Notes (Signed)
Site area: Left groin  A 6 and 8 french venous sheath was removed  Site Prior to Removal:  Level 0  Pressure Applied For 20 MINUTES    Bedrest Beginning at 1035am  Manual:   Yes.    Patient Status During Pull:  stable  Post Pull Groin Site:  Level 0  Post Pull Instructions Given:  Yes.    Post Pull Pulses Present:  Yes.    Dressing Applied:  Yes.    Comments:  VS remain stable during sheath pull

## 2017-03-22 NOTE — Progress Notes (Signed)
Reviewed pts revised discharge instuctions with pt and wife who verbalize understanding of new medication instructions. Given verbally and in writing.

## 2017-03-22 NOTE — Discharge Instructions (Signed)
No driving for 4 days. No lifting over 5 lbs for 1 week. No vigorous or sexual activity for 1 week. You may return to work on 03/29/17. Keep procedure site clean & dry. If you notice increased pain, swelling, bleeding or pus, call/return!  You may shower in 24 hours, but no soaking baths/hot tubs/pools for 1 week.    Cardiac Ablation Cardiac ablation is a procedure to stop some heart tissue from causing problems. The heart has many electrical connections. Sometimes these connections make the heart beat very fast or irregularly. Removing some problem areas can improve the heart rhythm or make it normal. What happens before the procedure?  Follow instructions from your doctor about what you cannot eat or drink.  Ask your doctor about:  Changing or stopping your normal medicines. This is important if you take diabetes medicines or blood thinners.  Taking medicines such as aspirin and ibuprofen. These medicines can thin your blood. Do not take these medicines before your procedure if your doctor tells you not to.  Plan to have someone take you home.  If you will be going home right after the procedure, plan to have someone with you for 24 hours. What happens during the procedure?  To lower your risk of infection:  Your health care team will wash or sanitize their hands.  Your skin will be washed with soap.  Hair may be removed from your neck or groin.  An IV tube will be put into one of your veins.  You will be given a medicine to help you relax (sedative).  Skin on your neck or groin will be numbed.  A cut (incision) will be made in your neck or groin.  A needle will be put through your cut and into a vein in your neck or groin.  A tube (catheter) will be put into the needle. The tube will be moved to your heart. X-rays (fluoroscopy) will be used to help guide the tube.  Small devices (electrodes) on the tip of the tube will send out electrical currents.  Dye may be put through  the tube. This helps your surgeon see your heart.  Electrical energy will be used to scar (ablate) some heart tissue. Your surgeon may use:  Heat (radiofrequency energy).  Laser energy.  Extreme cold (cryoablation).  The tube will be taken out.  Pressure will be held on your cut. This helps stop bleeding.  A bandage (dressing) will be put on your cut. The procedure may vary. What happens after the procedure?  You will be monitored until your medicines have worn off.  Your cut will be watched for bleeding. You will need to lie still for a few hours.  Do not drive for 24 hours or as long as your doctor tells you. Summary  Cardiac ablation is a procedure to stop some heart tissue from causing problems.  Electrical energy will be used to scar (ablate) some heart tissue. This information is not intended to replace advice given to you by your health care provider. Make sure you discuss any questions you have with your health care provider.   Femoral Site Care Refer to this sheet in the next few weeks. These instructions provide you with information about caring for yourself after your procedure. Your health care provider may also give you more specific instructions. Your treatment has been planned according to current medical practices, but problems sometimes occur. Call your health care provider if you have any problems or questions after your  procedure. What can I expect after the procedure? After your procedure, it is typical to have the following:  Bruising at the site that usually fades within 1-2 weeks.  Blood collecting in the tissue (hematoma) that may be painful to the touch. It should usually decrease in size and tenderness within 1-2 weeks. Follow these instructions at home:  Take medicines only as directed by your health care provider.  You may shower 24-48 hours after the procedure or as directed by your health care provider. Remove the bandage (dressing) and gently  wash the site with plain soap and water. Pat the area dry with a clean towel. Do not rub the site, because this may cause bleeding.  Do not take baths, swim, or use a hot tub until your health care provider approves.  Check your insertion site every day for redness, swelling, or drainage.  Do not apply powder or lotion to the site.  Limit use of stairs to twice a day for the first 2-3 days or as directed by your health care provider.  Do not squat for the first 2-3 days or as directed by your health care provider.  Do not lift over 10 lb (4.5 kg) for 5 days after your procedure or as directed by your health care provider.  Ask your health care provider when it is okay to:  Return to work or school.  Resume usual physical activities or sports.  Resume sexual activity.  Do not drive home if you are discharged the same day as the procedure. Have someone else drive you.  You may drive 24 hours after the procedure unless otherwise instructed by your health care provider.  Do not operate machinery or power tools for 24 hours after the procedure or as directed by your health care provider.  If your procedure was done as an outpatient procedure, which means that you went home the same day as your procedure, a responsible adult should be with you for the first 24 hours after you arrive home.  Keep all follow-up visits as directed by your health care provider. This is important. Contact a health care provider if:  You have a fever.  You have chills.  You have increased bleeding from the site. Hold pressure on the site. Get help right away if:  You have unusual pain at the site.  You have redness, warmth, or swelling at the site.  You have drainage (other than a small amount of blood on the dressing) from the site.  The site is bleeding, and the bleeding does not stop after 30 minutes of holding steady pressure on the site.  Your leg or foot becomes pale, cool, tingly, or  numb. This information is not intended to replace advice given to you by your health care provider. Make sure you discuss any questions you have with your health care provider. Document Released: 08/06/2014 Document Revised: 05/10/2016 Document Reviewed: 06/22/2014 Elsevier Interactive Patient Education  2017 [employer].

## 2017-03-22 NOTE — Anesthesia Preprocedure Evaluation (Addendum)
Anesthesia Evaluation  Patient identified by MRN, date of birth, ID band Patient awake    Reviewed: Allergy & Precautions, NPO status , Patient's Chart, lab work & pertinent test results  Airway Mallampati: II       Dental no notable dental hx.    Pulmonary neg pulmonary ROS,    Pulmonary exam normal        Cardiovascular  Rhythm:Irregular Rate:Normal     Neuro/Psych negative neurological ROS     GI/Hepatic negative GI ROS, Neg liver ROS,   Endo/Other  negative endocrine ROS  Renal/GU negative Renal ROS     Musculoskeletal negative musculoskeletal ROS (+)   Abdominal Normal abdominal exam  (+)   Peds  Hematology negative hematology ROS (+)   Anesthesia Other Findings   Reproductive/Obstetrics                            Anesthesia Physical Anesthesia Plan  ASA: II  Anesthesia Plan: General   Post-op Pain Management:    Induction: Intravenous  Airway Management Planned: LMA  Additional Equipment:   Intra-op Plan:   Post-operative Plan:   Informed Consent: I have reviewed the patients History and Physical, chart, labs and discussed the procedure including the risks, benefits and alternatives for the proposed anesthesia with the patient or authorized representative who has indicated his/her understanding and acceptance.     Plan Discussed with: CRNA  Anesthesia Plan Comments:         Anesthesia Quick Evaluation

## 2017-03-22 NOTE — Progress Notes (Signed)
Site area: Right groin 7 and 8 french venous sheath was removed  Site Prior to Removal:  Level 0  Pressure Applied For 20 MINUTES    Bedrest Beginning at 1035a  Manual:   Yes.    Patient Status During Pull:  stable  Post Pull Groin Site:  Level 0  Post Pull Instructions Given:  Yes.    Post Pull Pulses Present:  Yes.    Dressing Applied:  Yes.    Comments:  VS remain stable during sheath pull

## 2017-03-22 NOTE — Transfer of Care (Signed)
Immediate Anesthesia Transfer of Care Note  Patient: Harold Schwartz  Procedure(s) Performed: Procedure(s): A-Flutter Ablation (N/A)  Patient Location: PACU  Anesthesia Type:General  Level of Consciousness: awake, alert , oriented and patient cooperative  Airway & Oxygen Therapy: Patient Spontanous Breathing and Patient connected to nasal cannula oxygen  Post-op Assessment: Report given to RN and Post -op Vital signs reviewed and stable  Post vital signs: Reviewed and stable  Last Vitals:  Vitals:   03/22/17 0544  BP: 122/83  Pulse: (!) 55  Resp: 18  Temp: 36.4 C    Last Pain:  Vitals:   03/22/17 0544  TempSrc: Oral         Complications: No apparent anesthesia complications

## 2017-03-22 NOTE — Progress Notes (Signed)
Patient is seen post procedure Wound care and activity instructions were discussed with the patient VSS Telemetry is  SR Patient feels well without c/o CP or SOB, no c/o pain b/l groin Procedure sites are soft, non-tender, no hematoma b/l. Patient per Dr. Caryl Comes, is instructed to resume his Eliquis and complete on week, then discontinue Follow up has been arranged Planned for discharge once bedrest is completed, after ambulation if procedure sites remain stable  Tommye Standard, [name]

## 2017-03-25 ENCOUNTER — Encounter (HOSPITAL_COMMUNITY): Payer: Self-pay | Admitting: Internal Medicine

## 2017-04-09 ENCOUNTER — Ambulatory Visit: Payer: BC Managed Care – PPO | Admitting: Cardiology

## 2017-04-16 ENCOUNTER — Encounter: Payer: Self-pay | Admitting: Internal Medicine

## 2017-04-16 ENCOUNTER — Ambulatory Visit (INDEPENDENT_AMBULATORY_CARE_PROVIDER_SITE_OTHER): Payer: BC Managed Care – PPO | Admitting: Internal Medicine

## 2017-04-16 VITALS — BP 126/80 | HR 56 | Ht 71.0 in | Wt 214.2 lb

## 2017-04-16 DIAGNOSIS — I4892 Unspecified atrial flutter: Secondary | ICD-10-CM

## 2017-04-16 NOTE — Progress Notes (Signed)
      Patient Care Team: Jearld Fenton, NP as PCP - General (Internal Medicine)   HPI  Harold Schwartz is a 61 y.o. male Seen in follow-up for atrial flutter for which he underwent catheter ablation 4/18.  No recurrent arrhythmia. Good exercise tolerance.   Past Medical History:  Diagnosis Date  . Atrial flutter ([place])   . Colon polyps   . Depression   . Genital herpes   . Heart murmur   . Hemorrhoids   . Hyperlipidemia   . Hypogonadism in male     Past Surgical History:  Procedure Laterality Date  . A-FLUTTER ABLATION N/A 03/22/2017   Procedure: A-Flutter Ablation;  Surgeon: Deboraha Sprang, MD;  Location: [facility];  Service: Cardiovascular;  Laterality: N/A;  . KNEE ARTHROSCOPY    . VASECTOMY  1990    Current Outpatient Prescriptions  Medication Sig Dispense Refill  . ezetimibe (ZETIA) 10 MG tablet TAKE 1 TABLET (10 MG TOTAL) BY MOUTH DAILY. 90 tablet 0  . glucosamine-chondroitin 500-400 MG tablet Take 1 tablet by mouth 2 (two) times daily.    . NON FORMULARY Take 1 capsule by mouth 2 (two) times daily. Celery seed extract     . Omega-3 Fatty Acids (FISH OIL) 1200 MG CAPS Take 2,400-3,600 capsules by mouth daily. 2400 mg in the morning and 3600 mg in the evening    . Testosterone 30 MG/ACT SOLN PLACE 1 PUMP UNDER EACH ARMPIT ONCE DAILY (Patient taking differently: PLACE 1.5 PUMP UNDER EACH ARMPIT ONCE DAILY) 90 mL 5  . valACYclovir (VALTREX) 1000 MG tablet Take 1 tablet (1,000 mg total) by mouth daily. (Patient taking differently: Take 500 mg by mouth every evening. ) 90 tablet 3   No current facility-administered medications for this visit.     No Known Allergies    Review of Systems negative except from HPI and PMH  Physical Exam BP 126/80 (BP Location: Left Arm, Patient Position: Sitting, Cuff Size: Normal)   Pulse (!) 56   Ht 5\' 11"  (1.803 m)   Wt 214 lb 4 oz (97.2 kg)   BMI 29.88 kg/m  Well developed and well nourished in no acute  distress HENT normal E scleral and icterus clear Neck Supple  Regular rate and rhythm,  No clubbing cyanosis   Edema Alert and oriented, grossly normal motor and sensory function Skin Warm and Dry  ECG Personally reviewed  sinus  Assessment and  Plan  Atrial flutter   No recurrent arrhythmia  Reviewed potential atrial fib issues down the road and instructed in pulse surveillance   Will see prn  Current medicines are reviewed at length with the patient today .  The patient does not have concerns regarding medicines.

## 2017-04-16 NOTE — Patient Instructions (Signed)
Medication Instructions: - Your physician recommends that you continue on your current medications as directed. Please refer to the Current Medication list given to you today.  Labwork: - none ordered  Procedures/Testing: - none ordered  Follow-Up: - Dr. Klein will see you back on an as needed basis.  Any Additional Special Instructions Will Be Listed Below (If Applicable).     If you need a refill on your cardiac medications before your next appointment, please call your pharmacy.   

## 2017-05-24 NOTE — Addendum Note (Signed)
Addendum  created 05/24/17 1143 by Lyn Hollingshead, MD   Sign clinical note

## 2017-06-17 ENCOUNTER — Other Ambulatory Visit: Payer: Self-pay | Admitting: Internal Medicine

## 2017-08-29 ENCOUNTER — Ambulatory Visit: Payer: BC Managed Care – PPO | Admitting: Internal Medicine

## 2017-09-02 ENCOUNTER — Telehealth: Payer: Self-pay | Admitting: Cardiology

## 2017-09-02 NOTE — Telephone Encounter (Signed)
3 attempts to schedule appt   Patient says he is seeing dr. Caryl Comes and was released from primary cardiology and doesn't need to be seen   Deleting recall

## 2017-09-03 ENCOUNTER — Other Ambulatory Visit: Payer: Self-pay | Admitting: Internal Medicine

## 2017-09-03 DIAGNOSIS — E349 Endocrine disorder, unspecified: Secondary | ICD-10-CM

## 2017-09-04 NOTE — Telephone Encounter (Signed)
Last filled 02/2017 with 5 refills... Letter mailed letting pt know he is overdue for his CPE... Please advise

## 2017-09-04 NOTE — Telephone Encounter (Signed)
RX printed and signed and placed in MYD box 

## 2017-09-05 NOTE — Telephone Encounter (Signed)
Rx faxed

## 2017-09-18 ENCOUNTER — Other Ambulatory Visit: Payer: Self-pay | Admitting: Internal Medicine

## 2017-10-21 ENCOUNTER — Ambulatory Visit (INDEPENDENT_AMBULATORY_CARE_PROVIDER_SITE_OTHER): Payer: BC Managed Care – PPO | Admitting: Internal Medicine

## 2017-10-21 ENCOUNTER — Encounter: Payer: Self-pay | Admitting: Internal Medicine

## 2017-10-21 VITALS — BP 122/80 | HR 70 | Temp 97.9°F | Ht 71.0 in | Wt 216.0 lb

## 2017-10-21 DIAGNOSIS — E291 Testicular hypofunction: Secondary | ICD-10-CM

## 2017-10-21 DIAGNOSIS — Z125 Encounter for screening for malignant neoplasm of prostate: Secondary | ICD-10-CM

## 2017-10-21 DIAGNOSIS — I4892 Unspecified atrial flutter: Secondary | ICD-10-CM | POA: Diagnosis not present

## 2017-10-21 DIAGNOSIS — E781 Pure hyperglyceridemia: Secondary | ICD-10-CM

## 2017-10-21 DIAGNOSIS — Z0000 Encounter for general adult medical examination without abnormal findings: Secondary | ICD-10-CM | POA: Diagnosis not present

## 2017-10-21 DIAGNOSIS — A6001 Herpesviral infection of penis: Secondary | ICD-10-CM | POA: Diagnosis not present

## 2017-10-21 DIAGNOSIS — Z23 Encounter for immunization: Secondary | ICD-10-CM

## 2017-10-21 LAB — COMPREHENSIVE METABOLIC PANEL
ALK PHOS: 38 U/L — AB (ref 39–117)
ALT: 20 U/L (ref 0–53)
AST: 17 U/L (ref 0–37)
Albumin: 4.1 g/dL (ref 3.5–5.2)
BUN: 20 mg/dL (ref 6–23)
CHLORIDE: 103 meq/L (ref 96–112)
CO2: 29 mEq/L (ref 19–32)
Calcium: 9.4 mg/dL (ref 8.4–10.5)
Creatinine, Ser: 1.03 mg/dL (ref 0.40–1.50)
GFR: 77.58 mL/min (ref >60.00)
Glucose, Bld: 86 mg/dL (ref 70–99)
POTASSIUM: 4.4 meq/L (ref 3.5–5.1)
SODIUM: 139 meq/L (ref 135–145)
TOTAL PROTEIN: 6.6 g/dL (ref 6.0–8.3)
Total Bilirubin: 1.1 mg/dL (ref 0.2–1.2)

## 2017-10-21 LAB — CBC
HEMATOCRIT: 47 % (ref 39.0–52.0)
Hemoglobin: 15.8 g/dL (ref 13.0–17.0)
MCHC: 33.6 g/dL (ref 30.0–36.0)
MCV: 93.4 fl (ref 78.0–100.0)
Platelets: 138 10*3/uL — ABNORMAL LOW (ref 150.0–400.0)
RBC: 5.04 Mil/uL (ref 4.22–5.81)
RDW: 14 % (ref 11.5–15.5)
WBC: 4.7 10*3/uL (ref 4.0–10.5)

## 2017-10-21 LAB — LIPID PANEL
CHOLESTEROL: 175 mg/dL (ref 0–200)
HDL: 61.1 mg/dL (ref >39.00)
LDL CALC: 102 mg/dL — AB (ref 0–99)
NonHDL: 114.15
TRIGLYCERIDES: 63 mg/dL (ref 0.0–149.0)
Total CHOL/HDL Ratio: 3
VLDL: 12.6 mg/dL (ref 0.0–40.0)

## 2017-10-21 LAB — PSA: PSA: 3.61 ng/mL (ref 0.10–4.00)

## 2017-10-21 NOTE — Assessment & Plan Note (Signed)
Controlled on suppressive Valtrex

## 2017-10-21 NOTE — Patient Instructions (Signed)

## 2017-10-21 NOTE — Assessment & Plan Note (Signed)
S/p ablation No medications Will monitor

## 2017-10-21 NOTE — Assessment & Plan Note (Signed)
Continue Testosterone Gel for now

## 2017-10-21 NOTE — Assessment & Plan Note (Signed)
CMET and lipid profile today Encouraged him to consume a low fat diet Continue Zetia and Fish Oil

## 2017-10-21 NOTE — Progress Notes (Signed)
Subjective:    Patient ID: Harold Schwartz, male    DOB: 1956/11/16, 61 y.o.   MRN: [id number]  HPI  Pt presents to the clinic today for his annual exam. He is also due to follow up chronic conditions.  Atrial Flutter: s/p cardioversion, ablation. He follows with Dr. Caryl Comes.  Depression: Currently not an issue. Not medicated.   HLD: His last LDL was 98, 06/2016. He is taking Zetia and Fish Oil as prescribed. He consumes a low fat diet.  Hypotestosteronism: His testosterone level from 06/2016 was low. He is using the Testoserone Foam daily.  Flu: 10/2016 Tetanus: 04/2015 Pneumovax: 01/2012 Shingles Vaccine: 06/2015 PSA Screening: 06/2016 Colon Screening: 01/2013 Vision Screening: yearly Dentist: biannually  Diet: he does eat meat. He consumes more veggies than fruits. He tries to avoid fried foods. He drinks hot tea and water. Exercise: He walks 4 miles daily.   Review of Systems      Past Medical History:  Diagnosis Date  . Atrial flutter ([place])   . Colon polyps   . Depression   . Genital herpes   . Heart murmur   . Hemorrhoids   . Hyperlipidemia   . Hypogonadism in male     Current Outpatient Medications  Medication Sig Dispense Refill  . ezetimibe (ZETIA) 10 MG tablet Take 1 tablet (10 mg total) by mouth daily. MUST SCHEDULE ANNUAL PHYSICAL EXAM 30 tablet 0  . glucosamine-chondroitin 500-400 MG tablet Take 1 tablet by mouth 2 (two) times daily.    . NON FORMULARY Take 1 capsule by mouth 2 (two) times daily. Celery seed extract     . Omega-3 Fatty Acids (FISH OIL) 1200 MG CAPS Take 2,400-3,600 capsules by mouth daily. 2400 mg in the morning and 3600 mg in the evening    . Testosterone 30 MG/ACT SOLN APPLY/PLACE 1 PUMP UNDER EACH ARMPIT ONCE DAILY 90 mL 0  . valACYclovir (VALTREX) 1000 MG tablet Take 1 tablet (1,000 mg total) by mouth daily. (Patient taking differently: Take 500 mg by mouth every evening. ) 90 tablet 3   No current facility-administered medications for  this visit.     No Known Allergies  Family History  Problem Relation Age of Onset  . Breast cancer Mother   . Heart disease Mother   . Hyperlipidemia Mother   . Hypertension Mother   . Colon cancer Father   . Heart disease Father   . Alzheimer's disease Father   . Hyperlipidemia Father   . Hyperlipidemia Brother     Social History   Socioeconomic History  . Marital status: Married    Spouse name: Not on file  . Number of children: 2  . Years of education: Not on file  . Highest education level: Not on file  Social Needs  . Financial resource strain: Not on file  . Food insecurity - worry: Not on file  . Food insecurity - inability: Not on file  . Transportation needs - medical: Not on file  . Transportation needs - non-medical: Not on file  Occupational History  . Occupation: Engineer, site  Tobacco Use  . Smoking status: Never Smoker  . Smokeless tobacco: Never Used  Substance and Sexual Activity  . Alcohol use: Yes    Alcohol/week: 8.4 oz    Types: 14 Shots of liquor per week    Comment: moderate  . Drug use: No  . Sexual activity: Not on file  Other Topics Concern  . Not on file  Social History  Narrative  . Not on file     Constitutional: Denies fever, malaise, fatigue, headache or abrupt weight changes.  HEENT: Denies eye pain, eye redness, ear pain, ringing in the ears, wax buildup, runny nose, nasal congestion, bloody nose, or sore throat. Respiratory: Denies difficulty breathing, shortness of breath, cough or sputum production.   Cardiovascular: Denies chest pain, chest tightness, palpitations or swelling in the hands or feet.  Gastrointestinal: Pt reports intermittent constipation. Denies abdominal pain, bloating, diarrhea or blood in the stool.  GU: Denies urgency, frequency, pain with urination, burning sensation, blood in urine, odor or discharge. Musculoskeletal: Denies decrease in range of motion, difficulty with gait, muscle pain or joint pain  and swelling.  Skin: Denies redness, rashes, lesions or ulcercations.  Neurological: Pt reports intermittent numbness in right leg (history of sciatica). Denies dizziness, difficulty with memory, difficulty with speech or problems with balance and coordination.  Psych: Denies anxiety, depression, SI/HI.  No other specific complaints in a complete review of systems (except as listed in HPI above).  Objective:   Physical Exam   BP 122/80   Pulse 70   Temp 97.9 F (36.6 C) (Oral)   Ht 5\' 11"  (1.803 m)   Wt 216 lb (98 kg)   SpO2 98%   BMI 30.13 kg/m   Wt Readings from Last 3 Encounters:  04/16/17 214 lb 4 oz (97.2 kg)  03/22/17 208 lb (94.3 kg)  03/04/17 210 lb 9.6 oz (95.5 kg)    General: Appears his stated age, well developed, well nourished in NAD. Skin: Warm, dry and intact.  HEENT: Head: normal shape and size; Eyes: sclera white, no icterus, conjunctiva pink, PERRLA and EOMs intact; Ears: Tm's gray and intact, normal light reflex; Throat/Mouth: Teeth present, mucosa pink and moist, no exudate, lesions or ulcerations noted.  Neck:  Neck supple, trachea midline. No masses, lumps or thyromegaly present.  Cardiovascular: Normal rate and rhythm. S1,S2 noted.  No murmur, rubs or gallops noted. No JVD or BLE edema. No carotid bruits noted. Pulmonary/Chest: Normal effort and positive vesicular breath sounds. No respiratory distress. No wheezes, rales or ronchi noted.  Abdomen: Soft and nontender. Normal bowel sounds. No distention or masses noted. Liver, spleen and kidneys non palpable. Musculoskeletal: Strength 5/5 BUE/BLE. No difficulty with gait.  Neurological: Alert and oriented. Cranial nerves II-XII grossly intact. Coordination normal.  Psychiatric: Mood and affect normal. Behavior is normal. Judgment and thought content normal.    BMET    Component Value Date/Time   NA 139 03/20/2017 0815   K 4.5 03/20/2017 0815   CL 102 03/20/2017 0815   CO2 20 03/20/2017 0815   GLUCOSE  93 03/20/2017 0815   GLUCOSE 87 06/29/2016 0819   BUN 18 03/20/2017 0815   CREATININE 1.01 03/20/2017 0815   CALCIUM 9.0 03/20/2017 0815   GFRNONAA 79 03/20/2017 0815   GFRAA 92 03/20/2017 0815    Lipid Panel     Component Value Date/Time   CHOL 176 06/29/2016 0819   TRIG 66.0 06/29/2016 0819   HDL 65.00 06/29/2016 0819   CHOLHDL 3 06/29/2016 0819   VLDL 13.2 06/29/2016 0819   LDLCALC 98 06/29/2016 0819    CBC    Component Value Date/Time   WBC 3.4 03/20/2017 0815   WBC 4.7 06/29/2016 0819   RBC 4.96 03/20/2017 0815   RBC 4.96 06/29/2016 0819   HGB 14.9 03/20/2017 0815   HCT 44.0 03/20/2017 0815   PLT 149 (L) 03/20/2017 0815   MCV 89  03/20/2017 0815   MCH 30.0 03/20/2017 0815   MCH 30.2 12/04/2015 0824   MCHC 33.9 03/20/2017 0815   MCHC 34.1 06/29/2016 0819   RDW 14.3 03/20/2017 0815   LYMPHSABS 1.5 03/20/2017 0815   MONOABS 0.5 02/18/2015 1058   EOSABS 0.1 03/20/2017 0815   BASOSABS 0.0 03/20/2017 0815    Hgb A1C No results found for: HGBA1C         Assessment & Plan:   Preventative Health Maintenance:  Flu shot today Tetanus, pneumovax and shingles vaccine UTD PSA will be done with screening labs Colon screening UTD Encouraged him to consume a balanced diet and exercise regimen Advised him to see an eye doctor and dentist annually Will check CBC, CMET, Lipid and PSA today  RTC in 1 year, sooner if needed Webb Silversmith, NP

## 2017-10-23 ENCOUNTER — Other Ambulatory Visit: Payer: Self-pay | Admitting: Internal Medicine

## 2017-10-23 DIAGNOSIS — E349 Endocrine disorder, unspecified: Secondary | ICD-10-CM

## 2017-10-23 NOTE — Telephone Encounter (Signed)
Please advise if okay to refill. 

## 2017-10-24 NOTE — Telephone Encounter (Signed)
Zetia sent electronically. Testosterone RX printed and signed and placed in MYD box.

## 2017-10-24 NOTE — Telephone Encounter (Signed)
Rx faxed

## 2017-11-25 ENCOUNTER — Other Ambulatory Visit: Payer: Self-pay | Admitting: Internal Medicine

## 2017-12-03 ENCOUNTER — Encounter: Payer: Self-pay | Admitting: Internal Medicine

## 2017-12-04 MED ORDER — VALACYCLOVIR HCL 1 G PO TABS: 1000.0000 mg | ORAL_TABLET | Freq: Every day | ORAL | 3 refills | Status: DC

## 2017-12-23 ENCOUNTER — Other Ambulatory Visit: Payer: Self-pay | Admitting: Internal Medicine

## 2017-12-23 DIAGNOSIS — E349 Endocrine disorder, unspecified: Secondary | ICD-10-CM

## 2017-12-23 NOTE — Telephone Encounter (Signed)
Last filled 10/24/17... Please advise

## 2018-02-05 ENCOUNTER — Encounter: Payer: Self-pay | Admitting: Podiatry

## 2018-02-05 ENCOUNTER — Ambulatory Visit: Payer: BC Managed Care – PPO | Admitting: Podiatry

## 2018-02-05 ENCOUNTER — Ambulatory Visit (INDEPENDENT_AMBULATORY_CARE_PROVIDER_SITE_OTHER): Payer: BC Managed Care – PPO

## 2018-02-05 DIAGNOSIS — M7661 Achilles tendinitis, right leg: Secondary | ICD-10-CM | POA: Diagnosis not present

## 2018-02-05 DIAGNOSIS — M722 Plantar fascial fibromatosis: Secondary | ICD-10-CM

## 2018-02-05 MED ORDER — MELOXICAM 15 MG PO TABS: 15.0000 mg | ORAL_TABLET | Freq: Every day | ORAL | 3 refills | Status: DC

## 2018-02-05 MED ORDER — METHYLPREDNISOLONE 4 MG PO TBPK: ORAL_TABLET | ORAL | 0 refills | Status: DC

## 2018-02-05 NOTE — Patient Instructions (Signed)

## 2018-02-05 NOTE — Progress Notes (Signed)
He presents today with lateral and posterior lateral heel pain.  He has been aching for the past few weeks he went to urgent care and they said he had tendinitis they gave him a prednisone and wrapped it and since then he states that he has been working on a hallux and has noticed that he probably injured the tendon once again.  He states that they seems to be more painful with activity.  Objective: Vital signs are stable alert and oriented x3.  Pulses are palpable.  Neurologic sensorium is intact.  Deep tendon reflexes are intact.  He has pain to palpation retrocalcaneal tubercle of the right heel.  This is noted to be a little more lateral.  Radiographs taken today do demonstrate a retrocalcaneal heel spur with soft tissue increase in density at the plantar fascial calcaneal insertion site.  Assessment: Retrocalcaneal heel spur with insertional Achilles tendinitis.  Plan: I injected the area today with dexamethasone 2 mg after sterile Betadine skin prep and put him in a night splint.  Placed him on methylprednisolone to be followed by meloxicam.  Follow-up with him in 1 month

## 2018-02-10 ENCOUNTER — Other Ambulatory Visit: Payer: Self-pay | Admitting: Internal Medicine

## 2018-02-10 DIAGNOSIS — E349 Endocrine disorder, unspecified: Secondary | ICD-10-CM

## 2018-02-10 NOTE — Telephone Encounter (Signed)
Last filled 12-24-17 #19ml Last OV 10-21-17 No Future OV Last Testosterone lab 06-29-16

## 2018-02-25 ENCOUNTER — Encounter: Payer: Self-pay | Admitting: Emergency Medicine

## 2018-02-25 ENCOUNTER — Other Ambulatory Visit: Payer: Self-pay

## 2018-02-25 ENCOUNTER — Emergency Department: Payer: BC Managed Care – PPO

## 2018-02-25 ENCOUNTER — Emergency Department
Admission: EM | Admit: 2018-02-25 | Discharge: 2018-02-25 | Disposition: A | Discharge status: Home / Self Care | Payer: BC Managed Care – PPO | Attending: Emergency Medicine | Admitting: Emergency Medicine

## 2018-02-25 DIAGNOSIS — I82492 Acute embolism and thrombosis of other specified deep vein of left lower extremity: Secondary | ICD-10-CM | POA: Diagnosis not present

## 2018-02-25 DIAGNOSIS — Z7901 Long term (current) use of anticoagulants: Secondary | ICD-10-CM | POA: Diagnosis not present

## 2018-02-25 DIAGNOSIS — R011 Cardiac murmur, unspecified: Secondary | ICD-10-CM | POA: Diagnosis not present

## 2018-02-25 DIAGNOSIS — Z86718 Personal history of other venous thrombosis and embolism: Secondary | ICD-10-CM | POA: Insufficient documentation

## 2018-02-25 DIAGNOSIS — E785 Hyperlipidemia, unspecified: Secondary | ICD-10-CM | POA: Insufficient documentation

## 2018-02-25 DIAGNOSIS — M79605 Pain in left leg: Secondary | ICD-10-CM | POA: Diagnosis present

## 2018-02-25 DIAGNOSIS — Z79899 Other long term (current) drug therapy: Secondary | ICD-10-CM | POA: Insufficient documentation

## 2018-02-25 DIAGNOSIS — I4892 Unspecified atrial flutter: Secondary | ICD-10-CM | POA: Insufficient documentation

## 2018-02-25 DIAGNOSIS — I82412 Acute embolism and thrombosis of left femoral vein: Secondary | ICD-10-CM

## 2018-02-25 LAB — CBC WITH DIFFERENTIAL/PLATELET
Basophils Absolute: 0 10*3/uL (ref 0–0.1)
Basophils Relative: 1 %
EOS ABS: 0.2 10*3/uL (ref 0–0.7)
Eosinophils Relative: 4 %
HCT: 44.6 % (ref 40.0–52.0)
HEMOGLOBIN: 14.9 g/dL (ref 13.0–18.0)
LYMPHS ABS: 1.7 10*3/uL (ref 1.0–3.6)
Lymphocytes Relative: 35 %
MCH: 30.2 pg (ref 26.0–34.0)
MCHC: 33.5 g/dL (ref 32.0–36.0)
MCV: 90.2 fL (ref 80.0–100.0)
MONO ABS: 0.4 10*3/uL (ref 0.2–1.0)
MONOS PCT: 7 %
NEUTROS PCT: 53 %
Neutro Abs: 2.6 10*3/uL (ref 1.4–6.5)
Platelets: 126 10*3/uL — ABNORMAL LOW (ref 150–440)
RBC: 4.94 MIL/uL (ref 4.40–5.90)
RDW: 13.7 % (ref 11.5–14.5)
WBC: 4.9 10*3/uL (ref 3.8–10.6)

## 2018-02-25 LAB — PROTIME-INR
INR: 0.91
PROTHROMBIN TIME: 12.2 s (ref 11.4–15.2)

## 2018-02-25 LAB — BASIC METABOLIC PANEL
Anion gap: 8 (ref 5–15)
BUN: 26 mg/dL — AB (ref 6–20)
CHLORIDE: 106 mmol/L (ref 101–111)
CO2: 24 mmol/L (ref 22–32)
CREATININE: 1.06 mg/dL (ref 0.61–1.24)
Calcium: 8.7 mg/dL — ABNORMAL LOW (ref 8.9–10.3)
GFR calc non Af Amer: >60 mL/min (ref >60)
GLUCOSE: 101 mg/dL — AB (ref 65–99)
Potassium: 3.7 mmol/L (ref 3.5–5.1)
Sodium: 138 mmol/L (ref 135–145)

## 2018-02-25 MED ORDER — APIXABAN 5 MG PO TABS
10.0000 mg | ORAL_TABLET | Freq: Once | ORAL | Status: AC
  Administered 2018-02-25: 10 mg via ORAL
  Filled 2018-02-25: qty 2

## 2018-02-25 MED ORDER — APIXABAN 5 MG PO TABS: ORAL_TABLET | ORAL | 0 refills | Status: DC

## 2018-02-25 MED ORDER — OXYCODONE-ACETAMINOPHEN 5-325 MG PO TABS
1.0000 | ORAL_TABLET | Freq: Once | ORAL | Status: AC
  Administered 2018-02-25: 1 via ORAL

## 2018-02-25 MED ORDER — OXYCODONE-ACETAMINOPHEN 5-325 MG PO TABS
ORAL_TABLET | ORAL | Status: AC
  Filled 2018-02-25: qty 1

## 2018-02-25 NOTE — ED Notes (Addendum)
Pt to ultrasound via wheelchair; to be taken to room 8 once completed.

## 2018-02-25 NOTE — ED Provider Notes (Signed)
[facility] Emergency Department Provider Note ____________________________________________   First MD Initiated Contact with Patient 02/25/18 1939     (approximate)  I have reviewed the triage vital signs and the nursing notes.   HISTORY  Chief Complaint Leg Pain    HPI Harold Schwartz is a 62 y.o. male with past medical history as noted below including history of prior DVT of the left leg in 2017 who presents with pain behind the left knee, acute onset in the last several hours, constant, nonradiating, worse with sitting or bending leg and improved with standing.  The patient states it feels just like when he had a DVT in 2017.  He states that at that time, he had been driving long distances and based on discussion with his doctors the DVT was thought to be provoked, so he was never placed on anticoagulation.  The patient denies any chest pain, difficulty breathing, weakness or lightheadedness, any other abnormal bleeding, bruising, or clotting, or other acute symptoms.   Past Medical History:  Diagnosis Date  . Atrial flutter ([place])   . Colon polyps   . Depression   . Genital herpes   . Heart murmur   . Hemorrhoids   . Hyperlipidemia   . Hypogonadism in male     Patient Active Problem List   Diagnosis Date Noted  . Chronic deep vein thrombosis (DVT) of proximal vein of left lower extremity ([place]) 11/21/2016  . Genital herpes 04/25/2015  . Hypogonadism male 04/22/2014  . Hypertriglyceridemia 04/22/2014  . Paroxysmal atrial flutter ([place]) 04/22/2014    Past Surgical History:  Procedure Laterality Date  . A-FLUTTER ABLATION N/A 03/22/2017   Procedure: A-Flutter Ablation;  Surgeon: Deboraha Sprang, MD;  Location: [facility];  Service: Cardiovascular;  Laterality: N/A;  . KNEE ARTHROSCOPY    . VASECTOMY  1990    Prior to Admission medications   Medication Sig Start Date End Date Taking? Authorizing Provider  apixaban (ELIQUIS) 5 MG TABS  tablet Take 2 tablets (10 mg total) by mouth 2 (two) times daily for 7 days, THEN 1 tablet (5 mg total) 2 (two) times daily. 02/26/18 04/04/18  Arta Silence, MD  Celery Seed OIL by Does not apply route.    [provider]  ezetimibe (ZETIA) 10 MG tablet Take 1 tablet (10 mg total) by mouth daily. 11/27/17   Jearld Fenton, NP  glucosamine-chondroitin 500-400 MG tablet Take 1 tablet by mouth 2 (two) times daily.    [provider]  meloxicam (MOBIC) 15 MG tablet Take 1 tablet (15 mg total) by mouth daily. 02/05/18   Hyatt, Max T, DPM  methylPREDNISolone (MEDROL DOSEPAK) 4 MG TBPK tablet 6 day dose pack - take as directed 02/05/18   Hyatt, Max T, DPM  NON FORMULARY Take 1 capsule by mouth 2 (two) times daily. Celery seed extract     [provider]  Omega-3 Fatty Acids (FISH OIL) 1200 MG CAPS Take 2,400-3,600 capsules by mouth daily. 2400 mg in the morning and 3600 mg in the evening    [provider]  Testosterone 30 MG/ACT SOLN APPLY/PLACE 1 PUMP UNDER EACH ARMPIT ONCE DAILY 02/10/18   Jearld Fenton, NP  valACYclovir (VALTREX) 1000 MG tablet Take 1 tablet (1,000 mg total) by mouth daily. 12/04/17   Jearld Fenton, NP    Allergies Patient has no known allergies.  Family History  Problem Relation Age of Onset  . Breast cancer Mother   .  Heart disease Mother   . Hyperlipidemia Mother   . Hypertension Mother   . Colon cancer Father   . Heart disease Father   . Alzheimer's disease Father   . Hyperlipidemia Father   . Hyperlipidemia Brother     Social History Social History   Tobacco Use  . Smoking status: Never Smoker  . Smokeless tobacco: Never Used  Substance Use Topics  . Alcohol use: Yes    Alcohol/week: 8.4 oz    Types: 14 Shots of liquor per week    Comment: moderate  . Drug use: No    Review of Systems  Constitutional: No weakness. Eyes: No redness. ENT: No sore throat. Cardiovascular: Denies chest pain. Respiratory: Denies  shortness of breath. Gastrointestinal: No vomiting. Genitourinary: Negative for hematuria.  Musculoskeletal: Positive for left leg pain. Skin: Negative for rash. Neurological: Negative for focal weakness or numbness.   ____________________________________________   PHYSICAL EXAM:  VITAL SIGNS: ED Triage Vitals  Enc Vitals Group     BP 02/25/18 1851 (!) 139/91     Pulse Rate 02/25/18 1851 73     Resp 02/25/18 1851 18     Temp 02/25/18 1851 98 F (36.7 C)     Temp Source 02/25/18 1851 Oral     SpO2 02/25/18 1851 96 %     Weight 02/25/18 1851 208 lb (94.3 kg)     Height 02/25/18 1851 5\' 11"  (1.803 m)     Head Circumference --      Peak Flow --      Pain Score 02/25/18 1902 6     Pain Loc --      Pain Edu? --      Excl. in [place]? --     Constitutional: Alert and oriented. Well appearing and in no acute distress. Eyes: Conjunctivae are normal.  Head: Atraumatic. Nose: No congestion/rhinnorhea. Mouth/Throat: Mucous membranes are moist.   Neck: Normal range of motion.  Cardiovascular: Good peripheral circulation. Respiratory: Normal respiratory effort. Gastrointestinal:  No distention.  Musculoskeletal: No lower extremity edema.  2+ DP pulse in left lower extremity.  Mild tenderness to the left popliteal area.  No significant swelling.  No cutaneous findings.  Extremities warm and well perfused.  Neurologic:  Normal speech and language. No gross focal neurologic deficits are appreciated.  Skin:  Skin is warm and dry. No rash noted. Psychiatric: Mood and affect are normal. Speech and behavior are normal.  ____________________________________________   LABS (all labs ordered are listed, but only abnormal results are displayed)  Labs Reviewed  BASIC METABOLIC PANEL - Abnormal; Notable for the following components:      Result Value   Glucose, Bld 101 (*)    BUN 26 (*)    Calcium 8.7 (*)    All other components within normal limits  CBC WITH DIFFERENTIAL/PLATELET -  Abnormal; Notable for the following components:   Platelets 126 (*)    All other components within normal limits  PROTIME-INR   ____________________________________________  EKG   ____________________________________________  RADIOLOGY  [place] LLE: Acute femoral and popliteal DVT  ____________________________________________   PROCEDURES  Procedure(s) performed: No  Procedures  Critical Care performed: No ____________________________________________   INITIAL IMPRESSION / ASSESSMENT AND PLAN / ED COURSE  Pertinent labs & imaging results that were available during my care of the patient were reviewed by me and considered in my medical decision making (see chart for details).  62 year old male with past medical history as noted above including history in 2017 of  apparently provoked DVT (not on any anticoagulation) presents with recurrent left leg pain similar to that episode.  However, patient has had no recent immobilization, surgery, or other precipitating factors that he knows of.  On exam, there is mild tenderness to the left popliteal area but no other significant findings.  The patient has no signs or symptoms of PE.  Ultrasound today shows likely acute DVT in the left femoral and popliteal region corresponding to patient's symptoms.  At this time given that the DVT appears to be unprovoked and recurrent, it would be appropriate to start the patient on Eliquis.  However the patient does not require admission.  We will obtain basic labs,  give a dose of Eliquis here, and then send him home if the prescription for same and plan for close follow-up with his PMD.    ----------------------------------------- 21:15 PM on 02/25/2018 -----------------------------------------  Lab workup is unremarkable.  Patient is comfortable and feels well to go home.  Return precautions given, and the patient expresses understanding.  He agrees to follow-up with his primary care doctor.   Prescription for Eliquis provided and the first dose was given here.  ____________________________________________   FINAL CLINICAL IMPRESSION(S) / ED DIAGNOSES  Final diagnoses:  Acute deep vein thrombosis (DVT) of femoral vein of left lower extremity ([place])      NEW MEDICATIONS STARTED DURING THIS VISIT:  Discharge Medication List as of 02/25/2018  9:15 PM    START taking these medications   Details  apixaban (ELIQUIS) 5 MG TABS tablet Multiple Dosages:Starting Wed 02/26/2018, Last dose on Wed 03/05/2018, THEN Starting Thu 03/06/2018, Last dose on Fri 4/19/2019Take 2 tablets (10 mg total) by mouth 2 (two) times daily for 7 days, THEN 1 tablet (5 mg total) 2 (two) times daily., Print         Note:  This document was prepared using Dragon voice recognition software and may include unintentional dictation errors.    Arta Silence, MD 02/26/18 [phone]

## 2018-02-25 NOTE — ED Notes (Signed)
Patient states approx 2 hours PTA patient "It felt just like my last blood clot so I knew I had to come in" patient is not currently on blood thinners.

## 2018-02-25 NOTE — ED Notes (Signed)

## 2018-02-25 NOTE — Discharge Instructions (Signed)
Call your primary care doctor tomorrow to inform them about the results of the workup today and to arrange for follow-up and further testing is needed.  Take the Eliquis as prescribed until follow-up.  Return to the emergency department immediately for new, worsening, or persistent severe pain, difficulty walking, weakness or numbness, or any chest pain, difficulty breathing, palpitations, lightheadedness, or any other symptoms that concern you.

## 2018-02-25 NOTE — ED Triage Notes (Signed)
Pt with pain behind left knee that started 2 hr ago. Pt states that this feels like blood clot he had June 2017. Pt is not on blood thinners but does wear compression stockings. Not swelling noted at site. Pt did fall sat. and hit left knee.

## 2018-03-07 ENCOUNTER — Ambulatory Visit: Payer: BC Managed Care – PPO | Admitting: Internal Medicine

## 2018-03-07 VITALS — BP 122/76 | HR 66 | Temp 98.0°F | Wt 214.0 lb

## 2018-03-07 DIAGNOSIS — I82532 Chronic embolism and thrombosis of left popliteal vein: Secondary | ICD-10-CM

## 2018-03-07 DIAGNOSIS — Z1211 Encounter for screening for malignant neoplasm of colon: Secondary | ICD-10-CM | POA: Diagnosis not present

## 2018-03-07 MED ORDER — APIXABAN 5 MG PO TABS
5.0000 mg | ORAL_TABLET | Freq: Two times a day (BID) | ORAL | 3 refills | Status: DC
Start: 2018-03-07 — End: 2018-03-07

## 2018-03-07 MED ORDER — APIXABAN 5 MG PO TABS: 5.0000 mg | ORAL_TABLET | Freq: Two times a day (BID) | ORAL | 3 refills | Status: DC

## 2018-03-09 ENCOUNTER — Encounter: Payer: Self-pay | Admitting: Internal Medicine

## 2018-03-09 NOTE — Patient Instructions (Signed)
Deep Vein Thrombosis Deep vein thrombosis (DVT) is a condition in which a blood clot forms in a deep vein, such as a lower leg, thigh, or arm vein. A clot is blood that has thickened into a gel or solid. This condition is dangerous. It can lead to serious and even life-threatening complications if the clot travels to the lungs and causes a blockage (pulmonary embolism). It can also damage veins in the leg. This can result in leg pain, swelling, discoloration, and sores (post-thrombotic syndrome). What are the causes? This condition may be caused by:  A slowdown of blood flow.  Damage to a vein.  A condition that makes blood clot more easily.  What increases the risk? The following factors may make you more likely to develop this condition:  Being overweight.  Being elderly, especially over age 60.  Sitting or lying down for more than four hours.  Lack of physical activity (sedentary lifestyle).  Being pregnant, giving birth, or having recently given birth.  Taking medicines that contain estrogen.  Smoking.  A history of any of the following: ? Blood clots or blood clotting disease. ? Peripheral vascular disease. ? Inflammatory bowel disease. ? Cancer. ? Heart disease. ? Genetic conditions that affect how blood clots. ? Neurological diseases that affect the legs (leg paresis). ? Injury. ? Major or lengthy surgery. ? A central line placed inside a large vein.  What are the signs or symptoms? Symptoms of this condition include:  Swelling, pain, or tenderness in an arm or leg.  Warmth, redness, or discoloration in an arm or leg.  If the clot is in your leg, symptoms may be more noticeable or worse when you stand or walk. Some people do not have any symptoms. How is this diagnosed? This condition is diagnosed with:  A medical history.  A physical exam.  Tests, such as: ? Blood tests. These are done to see how your blood clots. ? Imaging tests. These are done to  check for clots. Tests may include:  Ultrasound.  CT scan.  MRI.  X-ray.  Venogram. For this test, X-rays are taken after a dye is injected into a vein.  How is this treated? Treatment for this condition depends on the cause, your risk for bleeding or developing more clots, and any medical conditions you have. Treatment may include:  Taking blood thinners (also called anticoagulants). These medicines may be taken by mouth, injected under the skin, or injected through an IV tube (catheter). These medicines prevent clots from forming.  Injecting medicine that dissolves blood clots into the affected vein (catheter-directed thrombolysis).  Having surgery. Surgery may be done to: ? Remove the clot. ? Place a filter in a large vein to catch blood clots before they reach the lungs.  Some treatments may be continued for up to six months. Follow these instructions at home: If you are taking an oral blood thinner:  Take the medicine exactly as told by your health care provider. Some blood thinners need to be taken at the same time every day. Do not skip a dose.  Ask your health care provider about what foods and drugs interact with the medicine.  Ask about possible side effects. General instructions  Blood thinners can cause easy bruising and difficulty stopping bleeding. Because of this, if you are taking or were given a blood thinner: ? Hold pressure over cuts for longer than usual. ? Tell your dentist and other health care providers that you are taking blood thinners before   having any procedures that can cause bleeding. ? Avoid contact sports.  Take over-the-counter and prescription medicines only as told by your health care provider.  Return to your normal activities as told by your health care provider. Ask your health care provider what activities are safe for you.  Wear compression stockings if recommended by your health care provider.  Keep all follow-up visits as told by  your health care provider. This is important. How is this prevented? To lower your risk of developing this condition again:  For 30 or more minutes every day, do an activity that: ? Involves moving your arms and legs. ? Increases your heart rate.  When traveling for longer than four hours: ? Exercise your arms and legs every hour. ? Drink plenty of water. ? Avoid drinking alcohol.  Avoid sitting or lying for a long time without moving your legs.  Stay a healthy weight.  If you are a woman who is older than age 35, avoid unnecessary use of medicines that contain estrogen.  Do not use any products that contain nicotine or tobacco, such as cigarettes and e-cigarettes. This is especially important if you take estrogen medicines. If you need help quitting, ask your health care provider.  Contact a health care provider if:  You miss a dose of your blood thinner.  You have nausea, vomiting, or diarrhea that lasts for more than one day.  Your menstrual period is heavier than usual.  You have unusual bruising. Get help right away if:  You have new or increased pain, swelling, or redness in an arm or leg.  You have numbness or tingling in an arm or leg.  You have shortness of breath.  You have chest pain.  You have a rapid or irregular heartbeat.  You feel light-headed or dizzy.  You cough up blood.  There is blood in your vomit, stool, or urine.  You have a serious fall or accident, or you hit your head.  You have a severe headache or confusion.  You have a cut that will not stop bleeding. These symptoms may represent a serious problem that is an emergency. Do not wait to see if the symptoms will go away. Get medical help right away. Call your local emergency services (911 in the [place] Do not drive yourself to the hospital. Summary  DVT is a condition in which a blood clot forms in a deep vein, such as a lower leg, thigh, or arm vein.  Symptoms can include swelling,  warmth, pain, and redness in your leg or arm.  Treatment may include taking blood thinners, injecting medicine that dissolves blood clots,wearing compression stockings, or surgery.  If you are prescribed blood thinners, take them exactly as told. This information is not intended to replace advice given to you by your health care provider. Make sure you discuss any questions you have with your health care provider. Document Released: 12/03/2005 Document Revised: 01/05/2017 Document Reviewed: 01/05/2017 Elsevier Interactive Patient Education  2018 Elsevier Inc.  

## 2018-03-09 NOTE — Progress Notes (Signed)
Subjective:    Patient ID: Harold Schwartz, male    DOB: 29-May-1956, 62 y.o.   MRN: [id number]  HPI  Pt presents to the clinic today for ER follow up. He went to the ER 3/12 with c/o left leg pain. Ultrasound of the LLE  Showed a DVT. It was felt this was nonprovoked and because this was his second DVT, he was started on Eliquis. He reports the pian and swelling have decreased. He has had a hematology workup in the past, and does not feel this needs to be reapeated. He would like a refill of Eliquis today.  He would also like referral for his colonoscopy  Review of Systems   Past Medical History:  Diagnosis Date  . Atrial flutter ([place])   . Colon polyps   . Depression   . Genital herpes   . Heart murmur   . Hemorrhoids   . Hyperlipidemia   . Hypogonadism in male     Current Outpatient Medications  Medication Sig Dispense Refill  . apixaban (ELIQUIS) 5 MG TABS tablet Take 1 tablet (5 mg total) by mouth 2 (two) times daily. 180 tablet 3  . Celery Seed OIL by Does not apply route.    . ezetimibe (ZETIA) 10 MG tablet Take 1 tablet (10 mg total) by mouth daily. 30 tablet 10  . glucosamine-chondroitin 500-400 MG tablet Take 1 tablet by mouth 2 (two) times daily.    . NON FORMULARY Take 1 capsule by mouth 2 (two) times daily. Celery seed extract     . Omega-3 Fatty Acids (FISH OIL) 1200 MG CAPS Take 2,400-3,600 capsules by mouth daily. 2400 mg in the morning and 3600 mg in the evening    . Testosterone 30 MG/ACT SOLN APPLY/PLACE 1 PUMP UNDER EACH ARMPIT ONCE DAILY 90 mL 0  . valACYclovir (VALTREX) 1000 MG tablet Take 1 tablet (1,000 mg total) by mouth daily. 90 tablet 3   No current facility-administered medications for this visit.     No Known Allergies  Family History  Problem Relation Age of Onset  . Breast cancer Mother   . Heart disease Mother   . Hyperlipidemia Mother   . Hypertension Mother   . Colon cancer Father   . Heart disease Father   . Alzheimer's disease Father    . Hyperlipidemia Father   . Hyperlipidemia Brother     Social History   Socioeconomic History  . Marital status: Married    Spouse name: Not on file  . Number of children: 2  . Years of education: Not on file  . Highest education level: Not on file  Occupational History  . Occupation: Engineer, site  Social Needs  . Financial resource strain: Not on file  . Food insecurity:    Worry: Not on file    Inability: Not on file  . Transportation needs:    Medical: Not on file    Non-medical: Not on file  Tobacco Use  . Smoking status: Never Smoker  . Smokeless tobacco: Never Used  Substance and Sexual Activity  . Alcohol use: Yes    Alcohol/week: 8.4 oz    Types: 14 Shots of liquor per week    Comment: moderate  . Drug use: No  . Sexual activity: Not on file  Lifestyle  . Physical activity:    Days per week: Not on file    Minutes per session: Not on file  . Stress: Not on file  Relationships  . Social connections:  Talks on phone: Not on file    Gets together: Not on file    Attends religious service: Not on file    Active member of club or organization: Not on file    Attends meetings of clubs or organizations: Not on file    Relationship status: Not on file  . Intimate partner violence:    Fear of current or ex partner: Not on file    Emotionally abused: Not on file    Physically abused: Not on file    Forced sexual activity: Not on file  Other Topics Concern  . Not on file  Social History Narrative  . Not on file     Constitutional: Denies fever, malaise, fatigue, headache or abrupt weight changes.  Respiratory: Denies difficulty breathing, shortness of breath, cough or sputum production.   Cardiovascular: Denies chest pain, chest tightness, palpitations or swelling in the hands or feet.  Musculoskeletal: Pt reports swelling of LLE. Denies decrease in range of motion, difficulty with gait, muscle pain or joint pain.    No other specific complaints in  a complete review of systems (except as listed in HPI above).  Objective:   Physical Exam  BP 122/76   Pulse 66   Temp 98 F (36.7 C) (Oral)   Wt 214 lb (97.1 kg)   SpO2 98%   BMI 29.85 kg/m  Wt Readings from Last 3 Encounters:  03/07/18 214 lb (97.1 kg)  02/25/18 208 lb (94.3 kg)  10/21/17 216 lb (98 kg)    General: Appears his stated age, well developed, well nourished in NAD. Skin: Warm, dry and intact. No redness noted of LLE. Cardiovascular: Normal rate and rhythm. S1,S2 noted.  No murmur, rubs or gallops noted. No JVD or BLE edema. No carotid bruits noted. Pulmonary/Chest: Normal effort and positive vesicular breath sounds. No respiratory distress. No wheezes, rales or ronchi noted.  Musculoskeletal: trace swelling of LLE. No pain with palpation.   BMET    Component Value Date/Time   NA 138 02/25/2018 1852   NA 139 03/20/2017 0815   K 3.7 02/25/2018 1852   CL 106 02/25/2018 1852   CO2 24 02/25/2018 1852   GLUCOSE 101 (H) 02/25/2018 1852   BUN 26 (H) 02/25/2018 1852   BUN 18 03/20/2017 0815   CREATININE 1.06 02/25/2018 1852   CALCIUM 8.7 (L) 02/25/2018 1852   GFRNONAA >60 02/25/2018 1852   GFRAA >60 02/25/2018 1852    Lipid Panel     Component Value Date/Time   CHOL 175 10/21/2017 1252   TRIG 63.0 10/21/2017 1252   HDL 61.10 10/21/2017 1252   CHOLHDL 3 10/21/2017 1252   VLDL 12.6 10/21/2017 1252   LDLCALC 102 (H) 10/21/2017 1252    CBC    Component Value Date/Time   WBC 4.9 02/25/2018 1852   RBC 4.94 02/25/2018 1852   HGB 14.9 02/25/2018 1852   HGB 14.9 03/20/2017 0815   HCT 44.6 02/25/2018 1852   HCT 44.0 03/20/2017 0815   PLT 126 (L) 02/25/2018 1852   PLT 149 (L) 03/20/2017 0815   MCV 90.2 02/25/2018 1852   MCV 89 03/20/2017 0815   MCH 30.2 02/25/2018 1852   MCHC 33.5 02/25/2018 1852   RDW 13.7 02/25/2018 1852   RDW 14.3 03/20/2017 0815   LYMPHSABS 1.7 02/25/2018 1852   LYMPHSABS 1.5 03/20/2017 0815   MONOABS 0.4 02/25/2018 1852    EOSABS 0.2 02/25/2018 1852   EOSABS 0.1 03/20/2017 0815   BASOSABS 0.0 02/25/2018 1852  BASOSABS 0.0 03/20/2017 0815    Hgb A1C No results found for: HGBA1C          Assessment & Plan:   ER follow up for LLE DVT:  ER notes, labs and imaging reviewed Continue lifelong Eliquis, refilled today He will follow up with cardiology  RTC as needed Webb Silversmith, NP

## 2018-03-11 ENCOUNTER — Telehealth: Payer: Self-pay

## 2018-03-11 NOTE — Telephone Encounter (Signed)
Left message for patient to call Anastasiya back in regards to a referral-Anastasiya V Hopkins, RMA   

## 2018-03-12 ENCOUNTER — Encounter: Payer: Self-pay | Admitting: Podiatry

## 2018-03-12 ENCOUNTER — Ambulatory Visit: Payer: BC Managed Care – PPO | Admitting: Podiatry

## 2018-03-12 DIAGNOSIS — Q828 Other specified congenital malformations of skin: Secondary | ICD-10-CM | POA: Diagnosis not present

## 2018-03-12 DIAGNOSIS — M7661 Achilles tendinitis, right leg: Secondary | ICD-10-CM | POA: Diagnosis not present

## 2018-03-12 NOTE — Progress Notes (Signed)
He presents today for follow-up of Achilles tendinitis right.  He states that is improving but still hurts some and I have a painful corn on my left foot just beneath the fifth metatarsal.  He reports that he has been treated for DVT left lower extremity since we saw him last.  He is currently taking Eliquis.  Objective: He has pain on palpation of the Achilles tendon right porokeratotic lesion sub-fifth met left.  Assessment: Porokeratosis Achilles tendinitis.  Plan: Injected dexamethasone and local anesthetic point of maximal tenderness to left heel after sterile Betadine skin prep total of 2 mg was utilized.  He will continue his immobilization and I debrided the reactive hyperkeratosis.

## 2018-03-21 ENCOUNTER — Other Ambulatory Visit: Payer: Self-pay | Admitting: Internal Medicine

## 2018-03-21 DIAGNOSIS — E349 Endocrine disorder, unspecified: Secondary | ICD-10-CM

## 2018-03-21 NOTE — Telephone Encounter (Signed)
Last filled 02/10/18, last CPE 10/2017 and last lab 06/2016... Please advise

## 2018-03-31 ENCOUNTER — Telehealth: Payer: Self-pay | Admitting: Podiatry

## 2018-03-31 NOTE — Telephone Encounter (Signed)
I'm a pt of Dr. Stephenie Acres. He has been treating me for a pain in my heel for several weeks and told me to call if it got worse. It has gotten worse and I'm hobbling around. My number is [phone]. I would be grateful for a call back. Thank you.

## 2018-04-01 ENCOUNTER — Encounter: Payer: Self-pay | Admitting: Podiatry

## 2018-04-01 ENCOUNTER — Ambulatory Visit: Payer: BC Managed Care – PPO | Admitting: Podiatry

## 2018-04-01 ENCOUNTER — Telehealth: Payer: Self-pay | Admitting: *Deleted

## 2018-04-01 DIAGNOSIS — M7661 Achilles tendinitis, right leg: Secondary | ICD-10-CM

## 2018-04-01 DIAGNOSIS — M722 Plantar fascial fibromatosis: Secondary | ICD-10-CM

## 2018-04-01 NOTE — Progress Notes (Signed)
He presents today for follow-up of his Achilles tendinitis of the right he states that has really been hurting for the past few days.  States that it was on and off since October and then really painful again and then for in February and calm down a little bit and has recently started to become more painful particularly after having driven 3 hours to the [place] and back.  He states that is affecting his ability to perform his daily activities and he is not getting any relief from it.  Objective: Vital signs are stable he is alert and oriented x3 pulses are palpable.  Severe pain on palpation of the Achilles tendon is distalmost lateral insertion site on his right heel.  He also has severe pain on palpation of the plantar fascia right.  Assessment: Plantar fasciitis right.  Insertional Achilles tendinitis right.  Cannot rule out tear on either 1 of these.  Plan: Since his pain has increased I feel that is necessary to perform a MRI to evaluate for surgical measures.

## 2018-04-01 NOTE — Telephone Encounter (Signed)
-----   Message from Rip Harbour, [facility] sent at 04/01/2018  2:55 PM EDT ----- Regarding: MRI  MRI right ankle - evaluate achilles tendon tear/plantar fascial tear right - surgical consideration

## 2018-04-02 NOTE — Progress Notes (Signed)
[employer] Auth for MRI #[id number]

## 2018-04-03 ENCOUNTER — Telehealth: Payer: Self-pay | Admitting: Gastroenterology

## 2018-04-03 NOTE — Telephone Encounter (Signed)
Patient left a voice message to schedule a colonoscopy

## 2018-04-07 NOTE — Telephone Encounter (Signed)
Please schedule.  Thanks [employer]

## 2018-04-09 ENCOUNTER — Encounter: Payer: Self-pay | Admitting: Internal Medicine

## 2018-04-16 ENCOUNTER — Ambulatory Visit: Payer: BC Managed Care – PPO | Admitting: Podiatry

## 2018-04-22 ENCOUNTER — Encounter: Payer: Self-pay | Admitting: *Deleted

## 2018-04-22 ENCOUNTER — Other Ambulatory Visit: Payer: Self-pay

## 2018-04-22 ENCOUNTER — Telehealth: Payer: Self-pay | Admitting: Internal Medicine

## 2018-04-22 DIAGNOSIS — Z1211 Encounter for screening for malignant neoplasm of colon: Secondary | ICD-10-CM

## 2018-04-22 MED ORDER — PEG 3350-KCL-NA BICARB-NACL 420 G PO SOLR: 4000.0000 mL | Freq: Once | ORAL | 0 refills | Status: AC

## 2018-04-22 NOTE — Telephone Encounter (Signed)
The patient has not seen Dr. Caryl Comes since 04/2017. He was a PRN follow up. Eliquis is being managed by his PCP at this time. Please address Medical clearance & anticoagulation with PCP.  Thanks!

## 2018-04-22 NOTE — Telephone Encounter (Signed)
° °  [facility] Pre-operative Risk Assessment    Request for surgical clearance:  1. What type of surgery is being performed? Colonoscopy   2. When is this surgery scheduled? 05/16/18   3. What type of clearance is required (medical clearance vs. Pharmacy clearance to hold med vs. Both)? Medical   4. Are there any medications that need to be held prior to surgery and how long? Not listed    5. Practice name and name of physician performing surgery? [facility]   6. What is your office phone number [phone]    7.   What is your office fax number [phone]   8.   Anesthesia type (None, local, MAC, general) ? Not listed

## 2018-05-02 ENCOUNTER — Telehealth: Payer: Self-pay

## 2018-05-02 NOTE — Telephone Encounter (Signed)
Received ELIQUIS clearance from Webb Silversmith, NP.  Hold instructions: 2 days prior restart 1 day following.   Patient has been advised.

## 2018-05-12 ENCOUNTER — Other Ambulatory Visit: Payer: Self-pay | Admitting: Internal Medicine

## 2018-05-12 DIAGNOSIS — E349 Endocrine disorder, unspecified: Secondary | ICD-10-CM

## 2018-05-13 ENCOUNTER — Telehealth: Payer: Self-pay | Admitting: Gastroenterology

## 2018-05-13 ENCOUNTER — Other Ambulatory Visit: Payer: Self-pay

## 2018-05-13 MED ORDER — NA SULFATE-K SULFATE-MG SULF 17.5-3.13-1.6 GM/177ML PO SOLN: 1.0000 | Freq: Once | ORAL | 0 refills | Status: AC

## 2018-05-13 NOTE — Telephone Encounter (Signed)
Last filled 03/21/2018... Please advise

## 2018-05-13 NOTE — Telephone Encounter (Signed)
Pt left vm to get rx [facility] to [facility] [place]  For his colonoscopy

## 2018-05-15 NOTE — Discharge Instructions (Signed)
General Anesthesia, Adult, Care After °These instructions provide you with information about caring for yourself after your procedure. Your health care provider may also give you more specific instructions. Your treatment has been planned according to current medical practices, but problems sometimes occur. Call your health care provider if you have any problems or questions after your procedure. °What can I expect after the procedure? °After the procedure, it is common to have: °· Vomiting. °· A sore throat. °· Mental slowness. ° °It is common to feel: °· Nauseous. °· Cold or shivery. °· Sleepy. °· Tired. °· Sore or achy, even in parts of your body where you did not have surgery. ° °Follow these instructions at home: °For at least 24 hours after the procedure: °· Do not: °? Participate in activities where you could fall or become injured. °? Drive. °? Use heavy machinery. °? Drink alcohol. °? Take sleeping pills or medicines that cause drowsiness. °? Make important decisions or sign legal documents. °? Take care of children on your own. °· Rest. °Eating and drinking °· If you vomit, drink water, juice, or soup when you can drink without vomiting. °· Drink enough fluid to keep your urine clear or pale yellow. °· Make sure you have little or no nausea before eating solid foods. °· Follow the diet recommended by your health care provider. °General instructions °· Have a responsible adult stay with you until you are awake and alert. °· Return to your normal activities as told by your health care provider. Ask your health care provider what activities are safe for you. °· Take over-the-counter and prescription medicines only as told by your health care provider. °· If you smoke, do not smoke without supervision. °· Keep all follow-up visits as told by your health care provider. This is important. °Contact a health care provider if: °· You continue to have nausea or vomiting at home, and medicines are not helpful. °· You  cannot drink fluids or start eating again. °· You cannot urinate after 8-12 hours. °· You develop a skin rash. °· You have fever. °· You have increasing redness at the site of your procedure. °Get help right away if: °· You have difficulty breathing. °· You have chest pain. °· You have unexpected bleeding. °· You feel that you are having a life-threatening or urgent problem. °This information is not intended to replace advice given to you by your health care provider. Make sure you discuss any questions you have with your health care provider. °Document Released: 03/11/2001 Document Revised: 05/07/2016 Document Reviewed: 11/17/2015 °Elsevier Interactive Patient Education © 2018 Elsevier Inc. ° °

## 2018-05-16 ENCOUNTER — Ambulatory Visit: Payer: BC Managed Care – PPO | Admitting: Anesthesiology

## 2018-05-16 ENCOUNTER — Ambulatory Visit
Admission: RE | Admit: 2018-05-16 | Discharge: 2018-05-16 | Disposition: A | Discharge status: Home / Self Care | Payer: BC Managed Care – PPO | Source: Ambulatory Visit | Attending: Gastroenterology | Admitting: Gastroenterology

## 2018-05-16 ENCOUNTER — Encounter
Admission: RE | Disposition: A | Discharge status: Home / Self Care | Payer: Self-pay | Source: Ambulatory Visit | Attending: Gastroenterology

## 2018-05-16 DIAGNOSIS — Z86718 Personal history of other venous thrombosis and embolism: Secondary | ICD-10-CM | POA: Insufficient documentation

## 2018-05-16 DIAGNOSIS — Z7902 Long term (current) use of antithrombotics/antiplatelets: Secondary | ICD-10-CM | POA: Insufficient documentation

## 2018-05-16 DIAGNOSIS — Z1211 Encounter for screening for malignant neoplasm of colon: Secondary | ICD-10-CM

## 2018-05-16 DIAGNOSIS — K640 First degree hemorrhoids: Secondary | ICD-10-CM | POA: Insufficient documentation

## 2018-05-16 DIAGNOSIS — Z79899 Other long term (current) drug therapy: Secondary | ICD-10-CM | POA: Diagnosis not present

## 2018-05-16 DIAGNOSIS — E785 Hyperlipidemia, unspecified: Secondary | ICD-10-CM | POA: Diagnosis not present

## 2018-05-16 DIAGNOSIS — Z86010 Personal history of colonic polyps: Secondary | ICD-10-CM | POA: Insufficient documentation

## 2018-05-16 DIAGNOSIS — I4891 Unspecified atrial fibrillation: Secondary | ICD-10-CM | POA: Diagnosis not present

## 2018-05-16 DIAGNOSIS — F329 Major depressive disorder, single episode, unspecified: Secondary | ICD-10-CM | POA: Insufficient documentation

## 2018-05-16 DIAGNOSIS — D122 Benign neoplasm of ascending colon: Secondary | ICD-10-CM | POA: Diagnosis not present

## 2018-05-16 DIAGNOSIS — Z800 Family history of malignant neoplasm of digestive organs: Secondary | ICD-10-CM

## 2018-05-16 DIAGNOSIS — D120 Benign neoplasm of cecum: Secondary | ICD-10-CM | POA: Diagnosis not present

## 2018-05-16 HISTORY — PX: COLONOSCOPY WITH PROPOFOL: SHX5780

## 2018-05-16 HISTORY — DX: Myoneural disorder, unspecified: G70.9

## 2018-05-16 HISTORY — DX: Deep phlebothrombosis in pregnancy, unspecified trimester: O22.30

## 2018-05-16 HISTORY — PX: POLYPECTOMY: SHX5525

## 2018-05-16 SURGERY — COLONOSCOPY WITH PROPOFOL
Anesthesia: General | Wound class: Contaminated

## 2018-05-16 MED ORDER — SODIUM CHLORIDE 0.9 % IV SOLN: INTRAVENOUS | Status: DC

## 2018-05-16 MED ORDER — LACTATED RINGERS IV SOLN
INTRAVENOUS | Status: DC
  Administered 2018-05-16 (×2): via INTRAVENOUS

## 2018-05-16 MED ORDER — PROPOFOL 10 MG/ML IV BOLUS
INTRAVENOUS | Status: DC | PRN
  Administered 2018-05-16: 40 mg via INTRAVENOUS
  Administered 2018-05-16 (×2): 20 mg via INTRAVENOUS
  Administered 2018-05-16 (×2): 40 mg via INTRAVENOUS
  Administered 2018-05-16: 80 mg via INTRAVENOUS

## 2018-05-16 MED ORDER — STERILE WATER FOR IRRIGATION IR SOLN
Status: DC | PRN
  Administered 2018-05-16: 09:00:00

## 2018-05-16 MED ORDER — LIDOCAINE HCL (CARDIAC) PF 100 MG/5ML IV SOSY
PREFILLED_SYRINGE | INTRAVENOUS | Status: DC | PRN
  Administered 2018-05-16: 20 mg via INTRAVENOUS

## 2018-05-16 SURGICAL SUPPLY — 24 items
CANISTER SUCT 1200ML W/VALVE (MISCELLANEOUS) ×3 IMPLANT
CLIP HMST 235XBRD CATH ROT (MISCELLANEOUS) IMPLANT
CLIP RESOLUTION 360 11X235 (MISCELLANEOUS)
ELECT REM PT RETURN 9FT ADLT (ELECTROSURGICAL)
ELECTRODE REM PT RTRN 9FT ADLT (ELECTROSURGICAL) IMPLANT
FCP ESCP3.2XJMB 240X2.8X (MISCELLANEOUS)
FORCEPS BIOP RAD 4 LRG CAP 4 (CUTTING FORCEPS) IMPLANT
FORCEPS BIOP RJ4 240 W/NDL (MISCELLANEOUS)
FORCEPS ESCP3.2XJMB 240X2.8X (MISCELLANEOUS) IMPLANT
GOWN CVR UNV OPN BCK APRN NK (MISCELLANEOUS) ×4 IMPLANT
GOWN ISOL THUMB LOOP REG UNIV (MISCELLANEOUS) ×2
INJECTOR VARIJECT VIN23 (MISCELLANEOUS) IMPLANT
KIT DEFENDO VALVE AND CONN (KITS) IMPLANT
KIT ENDO PROCEDURE OLY (KITS) ×3 IMPLANT
MARKER SPOT ENDO TATTOO 5ML (MISCELLANEOUS) IMPLANT
PROBE APC STR FIRE (PROBE) IMPLANT
RETRIEVER NET ROTH 2.5X230 LF (MISCELLANEOUS) IMPLANT
SNARE SHORT THROW 13M SML OVAL (MISCELLANEOUS) ×3 IMPLANT
SNARE SHORT THROW 30M LRG OVAL (MISCELLANEOUS) IMPLANT
SNARE SNG USE RND 15MM (INSTRUMENTS) IMPLANT
SPOT EX ENDOSCOPIC TATTOO (MISCELLANEOUS)
TRAP ETRAP POLY (MISCELLANEOUS) ×3 IMPLANT
VARIJECT INJECTOR VIN23 (MISCELLANEOUS)
WATER STERILE IRR 250ML POUR (IV SOLUTION) ×3 IMPLANT

## 2018-05-16 NOTE — Anesthesia Postprocedure Evaluation (Signed)
Anesthesia Post Note  Patient: Harold Schwartz  Procedure(s) Performed: COLONOSCOPY WITH PROPOFOL (N/A ) POLYPECTOMY  Patient location during evaluation: PACU Anesthesia Type: General Level of consciousness: awake and alert Pain management: pain level controlled Vital Signs Assessment: post-procedure vital signs reviewed and stable Respiratory status: spontaneous breathing, nonlabored ventilation, respiratory function stable and patient connected to nasal cannula oxygen Cardiovascular status: blood pressure returned to baseline and stable Postop Assessment: no apparent nausea or vomiting Anesthetic complications: no    Alisa Graff

## 2018-05-16 NOTE — H&P (Signed)
Lucilla Lame, MD [address] [place], [place] [address] Phone:[phone] Fax : [phone]  Primary Care Physician:  Jearld Fenton, NP Primary Gastroenterologist:  Dr. Allen Norris  Pre-Procedure History & Physical: HPI:  Harold Schwartz is a 62 y.o. male is here for an colonoscopy.   Past Medical History:  Diagnosis Date  . Atrial flutter ([place])   . Colon polyps   . Depression   . DVT (deep vein thrombosis) in pregnancy [facility])    last one in March 2019 taking eliquis  . Genital herpes   . Heart murmur   . Hemorrhoids   . Hyperlipidemia   . Hypogonadism in male   . Neuromuscular disorder ([place])    slight numbnes in right foot    Past Surgical History:  Procedure Laterality Date  . A-FLUTTER ABLATION N/A 03/22/2017   Procedure: A-Flutter Ablation;  Surgeon: Deboraha Sprang, MD;  Location: [facility];  Service: Cardiovascular;  Laterality: N/A;  . KNEE ARTHROSCOPY    . VASECTOMY  1990    Prior to Admission medications   Medication Sig Start Date End Date Taking? Authorizing Provider  ezetimibe (ZETIA) 10 MG tablet Take 1 tablet (10 mg total) by mouth daily. 11/27/17  Yes Jearld Fenton, NP  glucosamine-chondroitin 500-400 MG tablet Take 1 tablet by mouth 2 (two) times daily.   Yes [provider]  NON FORMULARY Take 1 capsule by mouth 2 (two) times daily. Celery seed extract    Yes [provider]  Omega-3 Fatty Acids (FISH OIL) 1200 MG CAPS Take 2,400-3,600 capsules by mouth daily. 2400 mg in the morning and 3600 mg in the evening   Yes [provider]  Testosterone 30 MG/ACT SOLN APPLY/PLACE 1 PUMP UNDER EACH ARMPIT ONCE DAILY 05/13/18  Yes Baity, Coralie Keens, NP  valACYclovir (VALTREX) 1000 MG tablet Take 1 tablet (1,000 mg total) by mouth daily. 12/04/17  Yes Jearld Fenton, NP  apixaban (ELIQUIS) 5 MG TABS tablet Take 1 tablet (5 mg total) by mouth 2 (two) times daily. 03/07/18 03/02/19  Jearld Fenton, NP  Celery Seed OIL by Does  not apply route.    [provider]    Allergies as of 04/22/2018  . (No Known Allergies)    Family History  Problem Relation Age of Onset  . Breast cancer Mother   . Heart disease Mother   . Hyperlipidemia Mother   . Hypertension Mother   . Colon cancer Father   . Heart disease Father   . Alzheimer's disease Father   . Hyperlipidemia Father   . Hyperlipidemia Brother     Social History   Socioeconomic History  . Marital status: Married    Spouse name: Not on file  . Number of children: 2  . Years of education: Not on file  . Highest education level: Not on file  Occupational History  . Occupation: Engineer, site  Social Needs  . Financial resource strain: Not on file  . Food insecurity:    Worry: Not on file    Inability: Not on file  . Transportation needs:    Medical: Not on file    Non-medical: Not on file  Tobacco Use  . Smoking status: Never Smoker  . Smokeless tobacco: Never Used  Substance and Sexual Activity  . Alcohol use: Yes    Alcohol/week: 4.2 oz    Types: 7 Shots of liquor per week    Comment: moderate  . Drug use: No  .  Sexual activity: Not on file  Lifestyle  . Physical activity:    Days per week: Not on file    Minutes per session: Not on file  . Stress: Not on file  Relationships  . Social connections:    Talks on phone: Not on file    Gets together: Not on file    Attends religious service: Not on file    Active member of club or organization: Not on file    Attends meetings of clubs or organizations: Not on file    Relationship status: Not on file  . Intimate partner violence:    Fear of current or ex partner: Not on file    Emotionally abused: Not on file    Physically abused: Not on file    Forced sexual activity: Not on file  Other Topics Concern  . Not on file  Social History Narrative  . Not on file    Review of Systems: See HPI, otherwise negative ROS  Physical Exam: BP 131/86   Pulse 64   Temp 97.9  F (36.6 C) (Temporal)   Resp 16   Ht 5\' 11"  (1.803 m)   Wt 211 lb (95.7 kg)   SpO2 98%   BMI 29.43 kg/m  General:   Alert,  pleasant and cooperative in NAD Head:  Normocephalic and atraumatic. Neck:  Supple; no masses or thyromegaly. Lungs:  Clear throughout to auscultation.    Heart:  Regular rate and rhythm. Abdomen:  Soft, nontender and nondistended. Normal bowel sounds, without guarding, and without rebound.   Neurologic:  Alert and  oriented x4;  grossly normal neurologically.  Impression/Plan: Harold Schwartz is here for an colonoscopy to be performed for family history of colon cancer.  Risks, benefits, limitations, and alternatives regarding  colonoscopy have been reviewed with the patient.  Questions have been answered.  All parties agreeable.   Lucilla Lame, MD  05/16/2018, 8:27 AM

## 2018-05-16 NOTE — Anesthesia Preprocedure Evaluation (Signed)
Anesthesia Evaluation  Patient identified by MRN, date of birth, ID band Patient awake    Reviewed: Allergy & Precautions, H&P , NPO status , Patient's Chart, lab work & pertinent test results, reviewed documented beta blocker date and time   Airway Mallampati: II  TM Distance: >3 FB Neck ROM: full    Dental no notable dental hx.    Pulmonary neg pulmonary ROS,    Pulmonary exam normal breath sounds clear to auscultation       Cardiovascular Exercise Tolerance: Good + DVT (March 2019)  + dysrhythmias Atrial Fibrillation  Rhythm:regular Rate:Normal     Neuro/Psych Depression negative neurological ROS     GI/Hepatic negative GI ROS, Neg liver ROS,   Endo/Other  negative endocrine ROS  Renal/GU negative Renal ROS  negative genitourinary   Musculoskeletal   Abdominal   Peds  Hematology negative hematology ROS (+)   Anesthesia Other Findings   Reproductive/Obstetrics negative OB ROS                             Anesthesia Physical Anesthesia Plan  ASA: II  Anesthesia Plan: General   Post-op Pain Management:    Induction:   PONV Risk Score and Plan:   Airway Management Planned:   Additional Equipment:   Intra-op Plan:   Post-operative Plan:   Informed Consent: I have reviewed the patients History and Physical, chart, labs and discussed the procedure including the risks, benefits and alternatives for the proposed anesthesia with the patient or authorized representative who has indicated his/her understanding and acceptance.   Dental Advisory Given  Plan Discussed with: CRNA  Anesthesia Plan Comments:         Anesthesia Quick Evaluation

## 2018-05-16 NOTE — Op Note (Signed)
[facility] Patient Name: Harold Schwartz Procedure Date: 05/16/2018 8:51 AM MRN: [id number] Account #: [id number] Date of Birth: 07-30-1956 Admit Type: Inpatient Age: 62 Room: [facility] OR ROOM 01 Gender: Male Note Status: Finalized Procedure:            Colonoscopy Indications:          Family history of colon cancer in a first-degree                        relative Providers:            Lucilla Lame MD, MD Referring MD:         Jearld Fenton (Referring MD) Medicines:            Propofol per Anesthesia Complications:        No immediate complications. Procedure:            Pre-Anesthesia Assessment:                       - Prior to the procedure, a History and Physical was                        performed, and patient medications and allergies were                        reviewed. The patient's tolerance of previous                        anesthesia was also reviewed. The risks and benefits of                        the procedure and the sedation options and risks were                        discussed with the patient. All questions were                        answered, and informed consent was obtained. Prior                        Anticoagulants: The patient has taken no previous                        anticoagulant or antiplatelet agents. ASA Grade                        Assessment: II - A patient with mild systemic disease.                        After reviewing the risks and benefits, the patient was                        deemed in satisfactory condition to undergo the                        procedure.                       After obtaining informed consent, the colonoscope was  passed under direct vision. Throughout the procedure,                        the patient's blood pressure, pulse, and oxygen                        saturations were monitored continuously. The [place]  [phone]) was introduced through the                        anus and advanced to the the cecum, identified by                        appendiceal orifice and ileocecal valve. The                        colonoscopy was performed without difficulty. The                        patient tolerated the procedure well. The quality of                        the bowel preparation was excellent. Findings:      The perianal and digital rectal examinations were normal.      A 3 mm polyp was found in the cecum. The polyp was sessile. The polyp       was removed with a cold snare. Resection and retrieval were complete.      Two sessile polyps were found in the ascending colon. The polyps were 2       to 7 mm in size. These polyps were removed with a cold snare. Resection       and retrieval were complete.      Non-bleeding internal hemorrhoids were found during retroflexion. The       hemorrhoids were Grade I (internal hemorrhoids that do not prolapse). Impression:           - One 3 mm polyp in the cecum, removed with a cold                        snare. Resected and retrieved.                       - Two 2 to 7 mm polyps in the ascending colon, removed                        with a cold snare. Resected and retrieved.                       - Non-bleeding internal hemorrhoids. Recommendation:       - Discharge patient to home.                       - Resume previous diet.                       - Continue present medications.                       -  Await pathology results.                       - Repeat colonoscopy in 5 years for surveillance. Procedure Code(s):    --- Professional ---                       [phone], Colonoscopy, flexible; with removal of tumor(s),                        polyp(s), or other lesion(s) by snare technique Diagnosis Code(s):    --- Professional ---                       Z80.0, Family history of malignant neoplasm of                        digestive organs                        D12.0, Benign neoplasm of cecum                       D12.2, Benign neoplasm of ascending colon CPT copyright 2017 American Medical Association. All rights reserved. The codes documented in this report are preliminary and upon coder review may  be revised to meet current compliance requirements. Lucilla Lame MD, MD 05/16/2018 9:18:44 AM This report has been signed electronically. Number of Addenda: 0 Note Initiated On: 05/16/2018 8:51 AM Scope Withdrawal Time: 0 hours 9 minutes 47 seconds  Total Procedure Duration: 0 hours 13 minutes 4 seconds       [facility]

## 2018-05-16 NOTE — Anesthesia Procedure Notes (Signed)
Procedure Name: MAC Date/Time: 05/16/2018 9:02 AM Performed by: Lind Guest, CRNA Pre-anesthesia Checklist: Patient identified, Emergency Drugs available, Suction available, Patient being monitored and Timeout performed Patient Re-evaluated:Patient Re-evaluated prior to induction Oxygen Delivery Method: Nasal cannula

## 2018-05-16 NOTE — Transfer of Care (Signed)
Immediate Anesthesia Transfer of Care Note  Patient: Harold Schwartz  Procedure(s) Performed: COLONOSCOPY WITH PROPOFOL (N/A ) POLYPECTOMY  Patient Location: PACU  Anesthesia Type: General  Level of Consciousness: awake, alert  and patient cooperative  Airway and Oxygen Therapy: Patient Spontanous Breathing and Patient connected to supplemental oxygen  Post-op Assessment: Post-op Vital signs reviewed, Patient's Cardiovascular Status Stable, Respiratory Function Stable, Patent Airway and No signs of Nausea or vomiting  Post-op Vital Signs: Reviewed and stable  Complications: No apparent anesthesia complications

## 2018-05-18 ENCOUNTER — Encounter: Payer: Self-pay | Admitting: Gastroenterology

## 2018-05-19 ENCOUNTER — Encounter: Payer: Self-pay | Admitting: Gastroenterology

## 2018-06-03 ENCOUNTER — Encounter: Payer: Self-pay | Admitting: Internal Medicine

## 2018-06-27 ENCOUNTER — Other Ambulatory Visit: Payer: Self-pay | Admitting: Internal Medicine

## 2018-06-27 DIAGNOSIS — E349 Endocrine disorder, unspecified: Secondary | ICD-10-CM

## 2018-06-27 NOTE — Telephone Encounter (Signed)
Last filled 05/13/18... Please advise--CPE not due until 10/2018

## 2018-06-30 ENCOUNTER — Ambulatory Visit: Payer: BC Managed Care – PPO | Admitting: Internal Medicine

## 2018-06-30 ENCOUNTER — Encounter: Payer: Self-pay | Admitting: Internal Medicine

## 2018-06-30 VITALS — BP 122/84 | HR 78 | Temp 98.2°F | Wt 219.0 lb

## 2018-06-30 DIAGNOSIS — R252 Cramp and spasm: Secondary | ICD-10-CM | POA: Diagnosis not present

## 2018-06-30 LAB — VITAMIN D 25 HYDROXY (VIT D DEFICIENCY, FRACTURES): VITD: 23.6 ng/mL — ABNORMAL LOW (ref 30.00–100.00)

## 2018-06-30 LAB — BASIC METABOLIC PANEL
BUN: 19 mg/dL (ref 6–23)
CO2: 27 mEq/L (ref 19–32)
CREATININE: 1.2 mg/dL (ref 0.40–1.50)
Calcium: 9 mg/dL (ref 8.4–10.5)
Chloride: 105 mEq/L (ref 96–112)
GFR: 64.9 mL/min (ref >60.00)
GLUCOSE: 106 mg/dL — AB (ref 70–99)
POTASSIUM: 4 meq/L (ref 3.5–5.1)
Sodium: 140 mEq/L (ref 135–145)

## 2018-06-30 LAB — MAGNESIUM: Magnesium: 2 mg/dL (ref 1.5–2.5)

## 2018-06-30 LAB — VITAMIN B12: Vitamin B-12: 365 pg/mL (ref 211–911)

## 2018-06-30 LAB — TSH: TSH: 2.03 u[IU]/mL (ref 0.35–4.50)

## 2018-06-30 NOTE — Progress Notes (Signed)
Subjective:    Patient ID: Harold Schwartz, male    DOB: [birth year], 62 y.o.   MRN: [id number]  HPI  Pt presents to the clinic today with c/o leg cramps. This has been an intermittent issue for years, but reoccurred about 2 weeks ago.. It is waking him up from sleep at night. He walks 4 miles daily but does not have calf pain when walking. He has tried increasing water intake, and restarted his Magnesium supplement with minimal relief. He is on Eliquis for chronic DVT.  Review of Systems  Past Medical History:  Diagnosis Date  . Atrial flutter ([place])   . Colon polyps   . Depression   . DVT (deep vein thrombosis) in pregnancy [facility])    last one in March 2019 taking eliquis  . Genital herpes   . Heart murmur   . Hemorrhoids   . Hyperlipidemia   . Hypogonadism in male   . Neuromuscular disorder ([place])    slight numbnes in right foot    Current Outpatient Medications  Medication Sig Dispense Refill  . apixaban (ELIQUIS) 5 MG TABS tablet Take 1 tablet (5 mg total) by mouth 2 (two) times daily. 180 tablet 3  . Celery Seed OIL by Does not apply route.    . ezetimibe (ZETIA) 10 MG tablet Take 1 tablet (10 mg total) by mouth daily. 30 tablet 10  . glucosamine-chondroitin 500-400 MG tablet Take 1 tablet by mouth 2 (two) times daily.    . NON FORMULARY Take 1 capsule by mouth 2 (two) times daily. Celery seed extract     . Omega-3 Fatty Acids (FISH OIL) 1200 MG CAPS Take 2,400-3,600 capsules by mouth daily. 2400 mg in the morning and 3600 mg in the evening    . Testosterone 30 MG/ACT SOLN APPLY/PLACE 1 PUMP UNDER EACH ARMPIT ONCE DAILY 90 mL 0  . valACYclovir (VALTREX) 1000 MG tablet Take 1 tablet (1,000 mg total) by mouth daily. 90 tablet 3   No current facility-administered medications for this visit.     No Known Allergies  Family History  Problem Relation Age of Onset  . Breast cancer Mother   . Heart disease Mother   . Hyperlipidemia Mother   . Hypertension Mother   . Colon  cancer Father   . Heart disease Father   . Alzheimer's disease Father   . Hyperlipidemia Father   . Hyperlipidemia Brother     Social History   Socioeconomic History  . Marital status: Married    Spouse name: Not on file  . Number of children: 2  . Years of education: Not on file  . Highest education level: Not on file  Occupational History  . Occupation: Engineer, site  Social Needs  . Financial resource strain: Not on file  . Food insecurity:    Worry: Not on file    Inability: Not on file  . Transportation needs:    Medical: Not on file    Non-medical: Not on file  Tobacco Use  . Smoking status: Never Smoker  . Smokeless tobacco: Never Used  Substance and Sexual Activity  . Alcohol use: Yes    Alcohol/week: 4.2 oz    Types: 7 Shots of liquor per week    Comment: moderate  . Drug use: No  . Sexual activity: Not on file  Lifestyle  . Physical activity:    Days per week: Not on file    Minutes per session: Not on file  . Stress: Not  on file  Relationships  . Social connections:    Talks on phone: Not on file    Gets together: Not on file    Attends religious service: Not on file    Active member of club or organization: Not on file    Attends meetings of clubs or organizations: Not on file    Relationship status: Not on file  . Intimate partner violence:    Fear of current or ex partner: Not on file    Emotionally abused: Not on file    Physically abused: Not on file    Forced sexual activity: Not on file  Other Topics Concern  . Not on file  Social History Narrative  . Not on file     Constitutional: Denies fever, malaise, fatigue, headache or abrupt weight changes.  Musculoskeletal: Pt reports leg cramps. Denies decrease in range of motion, difficulty with gait,  or joint pain and swelling.    No other specific complaints in a complete review of systems (except as listed in HPI above).   Objective:   Physical Exam    BP 122/84   Pulse 78    Temp 98.2 F (36.8 C) (Oral)   Wt 219 lb (99.3 kg)   SpO2 97%   BMI 30.54 kg/m  Wt Readings from Last 3 Encounters:  06/30/18 219 lb (99.3 kg)  05/16/18 211 lb (95.7 kg)  03/07/18 214 lb (97.1 kg)    General: Appears his stated age, well developed, well nourished in NAD. Musculoskeletal: Normal flexion, extension of the knees. No joint swelling noted. No pain with palpation of the calves. No difficulty with gait.  Neurological: Alert and oriented. Sensation intact to BLE.  BMET    Component Value Date/Time   NA 138 02/25/2018 1852   NA 139 03/20/2017 0815   K 3.7 02/25/2018 1852   CL 106 02/25/2018 1852   CO2 24 02/25/2018 1852   GLUCOSE 101 (H) 02/25/2018 1852   BUN 26 (H) 02/25/2018 1852   BUN 18 03/20/2017 0815   CREATININE 1.06 02/25/2018 1852   CALCIUM 8.7 (L) 02/25/2018 1852   GFRNONAA >60 02/25/2018 1852   GFRAA >60 02/25/2018 1852    Lipid Panel     Component Value Date/Time   CHOL 175 10/21/2017 1252   TRIG 63.0 10/21/2017 1252   HDL 61.10 10/21/2017 1252   CHOLHDL 3 10/21/2017 1252   VLDL 12.6 10/21/2017 1252   LDLCALC 102 (H) 10/21/2017 1252    CBC    Component Value Date/Time   WBC 4.9 02/25/2018 1852   RBC 4.94 02/25/2018 1852   HGB 14.9 02/25/2018 1852   HGB 14.9 03/20/2017 0815   HCT 44.6 02/25/2018 1852   HCT 44.0 03/20/2017 0815   PLT 126 (L) 02/25/2018 1852   PLT 149 (L) 03/20/2017 0815   MCV 90.2 02/25/2018 1852   MCV 89 03/20/2017 0815   MCH 30.2 02/25/2018 1852   MCHC 33.5 02/25/2018 1852   RDW 13.7 02/25/2018 1852   RDW 14.3 03/20/2017 0815   LYMPHSABS 1.7 02/25/2018 1852   LYMPHSABS 1.5 03/20/2017 0815   MONOABS 0.4 02/25/2018 1852   EOSABS 0.2 02/25/2018 1852   EOSABS 0.1 03/20/2017 0815   BASOSABS 0.0 02/25/2018 1852   BASOSABS 0.0 03/20/2017 0815    Hgb A1C No results found for: HGBA1C        Assessment & Plan:   Muscle Cramps in Legs:  Continue adequate water intake Continue Magnesium supplement Will check  BMET, TSH,  Vit D and B12 today If labs normal, consider Gabapentin at night  Return precautions discussed Webb Silversmith, NP

## 2018-06-30 NOTE — Patient Instructions (Signed)
Leg Cramps Leg cramps occur when a muscle or muscles tighten and you have no control over this tightening (involuntary muscle contraction). Muscle cramps can develop in any muscle, but the most common place is in the calf muscles of the leg. Those cramps can occur during exercise or when you are at rest. Leg cramps are painful, and they may last for a few seconds to a few minutes. Cramps may return several times before they finally stop. Usually, leg cramps are not caused by a serious medical problem. In many cases, the cause is not known. Some common causes include:  Overexertion.  Overuse from repetitive motions, or doing the same thing over and over.  Remaining in a certain position for a long period of time.  Improper preparation, form, or technique while performing a sport or an activity.  Dehydration.  Injury.  Side effects of some medicines.  Abnormally low levels of the salts and ions in your blood (electrolytes), especially potassium and calcium. These levels could be low if you are taking water pills (diuretics) or if you are pregnant.  Follow these instructions at home: Watch your condition for any changes. Taking the following actions may help to lessen any discomfort that you are feeling:  Stay well-hydrated. Drink enough fluid to keep your urine clear or pale yellow.  Try massaging, stretching, and relaxing the affected muscle. Do this for several minutes at a time.  For tight or tense muscles, use a warm towel, heating pad, or hot shower water directed to the affected area.  If you are sore or have pain after a cramp, applying ice to the affected area may relieve discomfort. ? Put ice in a plastic bag. ? Place a towel between your skin and the bag. ? Leave the ice on for 20 minutes, 2-3 times per day.  Avoid strenuous exercise for several days if you have been having frequent leg cramps.  Make sure that your diet includes the essential minerals for your muscles to  work normally.  Take medicines only as directed by your health care provider.  Contact a health care provider if:  Your leg cramps get more severe or more frequent, or they do not improve over time.  Your foot becomes cold, numb, or blue. This information is not intended to replace advice given to you by your health care provider. Make sure you discuss any questions you have with your health care provider. Document Released: 01/10/2005 Document Revised: 05/10/2016 Document Reviewed: 11/10/2014 Elsevier Interactive Patient Education  2018 Elsevier Inc.  

## 2018-07-01 ENCOUNTER — Encounter: Payer: Self-pay | Admitting: Internal Medicine

## 2018-07-03 MED ORDER — GABAPENTIN 100 MG PO CAPS: 100.0000 mg | ORAL_CAPSULE | Freq: Every day | ORAL | 2 refills | Status: DC

## 2018-07-03 NOTE — Addendum Note (Signed)
Addended by: Jearld Fenton on: 07/03/2018 07:48 AM   Modules accepted: Orders

## 2018-07-04 ENCOUNTER — Encounter: Payer: Self-pay | Admitting: Internal Medicine

## 2018-07-05 ENCOUNTER — Other Ambulatory Visit: Payer: Self-pay | Admitting: Internal Medicine

## 2018-07-05 MED ORDER — CYCLOBENZAPRINE HCL 10 MG PO TABS: 10.0000 mg | ORAL_TABLET | Freq: Two times a day (BID) | ORAL | 0 refills | Status: DC | PRN

## 2018-07-19 ENCOUNTER — Ambulatory Visit
Admission: RE | Admit: 2018-07-19 | Discharge: 2018-07-19 | Disposition: A | Discharge status: Home / Self Care | Payer: BC Managed Care – PPO | Source: Ambulatory Visit | Attending: Podiatry | Admitting: Podiatry

## 2018-07-19 DIAGNOSIS — M722 Plantar fascial fibromatosis: Secondary | ICD-10-CM

## 2018-07-19 DIAGNOSIS — M7661 Achilles tendinitis, right leg: Secondary | ICD-10-CM

## 2018-07-21 ENCOUNTER — Telehealth: Payer: Self-pay | Admitting: *Deleted

## 2018-07-21 NOTE — Telephone Encounter (Signed)
-----   Message from Garrel Ridgel, [place] sent at 07/21/2018  8:08 AM EDT ----- Send for an over read and inform patient of the delay.  This was ordered in April.  What took so long?

## 2018-07-21 NOTE — Telephone Encounter (Signed)
I informed pt of Dr. Milinda Pointer review of results and request to send copy of MRI disc to a radiology specialist for more information to plan treatment and there would be a 7-10 day delay in final results. Faxed request for MRI disc to [facility].

## 2018-07-23 ENCOUNTER — Encounter: Payer: Self-pay | Admitting: Podiatry

## 2018-07-24 ENCOUNTER — Encounter: Payer: Self-pay | Admitting: Podiatry

## 2018-07-24 ENCOUNTER — Ambulatory Visit: Payer: BC Managed Care – PPO | Admitting: Podiatry

## 2018-07-24 DIAGNOSIS — M722 Plantar fascial fibromatosis: Secondary | ICD-10-CM

## 2018-07-24 DIAGNOSIS — M7661 Achilles tendinitis, right leg: Secondary | ICD-10-CM | POA: Diagnosis not present

## 2018-07-24 NOTE — Telephone Encounter (Signed)
Received MRI disc 07/23/2018, mailed to [facility].

## 2018-07-24 NOTE — Progress Notes (Signed)
He presents today for follow-up of his Achilles tendinitis and plantar fasciitis of his right leg and foot.  He states that seems to be getting worse and is affecting my ability to perform her daily activities.  Objective: Vital signs are stable he is alert and oriented x3 severe pain on dorsiflexion of the ankle at the level of the Achilles at the insertion site on the heel.  He also has pain on palpation medial calcaneal tubercle of the plantar heel.  Radiographs were reviewed once again an MRI was reviewed stating that there was severe tendinopathy bursitis retrocalcaneal heel spur and fasciitis.  At this point he is ready to do anything to help alleviate symptoms.  Assessment: Insertional Achilles tendinitis gastroc equinus and plantar fasciitis.  Plan: Discussed etiology pathology conservative versus surgical therapies.  Signed him up for a endoscopic plantar fasciotomy gastrocnemius recession, Achilles tenolysis, retrocalcaneal heel spur resection, and a cast, I answered all the questions regarding these procedures to the best my ability layman's terms.  He understood this was amenable to it and signed all 3 pages a consent form.  We are requesting medical clearance from his cardiologist and requesting to stop his Eliquis for at least 3 days.

## 2018-07-24 NOTE — Patient Instructions (Signed)
Pre-Operative Instructions  Congratulations, you have decided to take an important step towards improving your quality of life.  You can be assured that the doctors and staff at [facility] will be with you every step of the way.  Here are some important things you should know:  1. Plan to be at the surgery center/hospital at least 1 (one) hour prior to your scheduled time, unless otherwise directed by the surgical center/hospital staff.  You must have a responsible adult accompany you, remain during the surgery and drive you home.  Make sure you have directions to the surgical center/hospital to ensure you arrive on time. 2. If you are having surgery at [facility] or [facility], you will need a copy of your medical history and physical form from your family physician within one month prior to the date of surgery. We will give you a form for your primary physician to complete.  3. We make every effort to accommodate the date you request for surgery.  However, there are times where surgery dates or times have to be moved.  We will contact you as soon as possible if a change in schedule is required.   4. No aspirin/ibuprofen for one week before surgery.  If you are on aspirin, any non-steroidal anti-inflammatory medications (Mobic, Aleve, Ibuprofen) should not be taken seven (7) days prior to your surgery.  You make take Tylenol for pain prior to surgery.  5. Medications - If you are taking daily heart and blood pressure medications, seizure, reflux, allergy, asthma, anxiety, pain or diabetes medications, make sure you notify the surgery center/hospital before the day of surgery so they can tell you which medications you should take or avoid the day of surgery. 6. No food or drink after midnight the night before surgery unless directed otherwise by surgical center/hospital staff. 7. No alcoholic beverages 24-hours prior to surgery.  No smoking 24-hours prior or 24-hours after  surgery. 8. Wear loose pants or shorts. They should be loose enough to fit over bandages, boots, and casts. 9. Don't wear slip-on shoes. Sneakers are preferred. 10. Bring your boot with you to the surgery center/hospital.  Also bring crutches or a walker if your physician has prescribed it for you.  If you do not have this equipment, it will be provided for you after surgery. 11. If you have not been contacted by the surgery center/hospital by the day before your surgery, call to confirm the date and time of your surgery. 12. Leave-time from work may vary depending on the type of surgery you have.  Appropriate arrangements should be made prior to surgery with your employer. 13. Prescriptions will be provided immediately following surgery by your doctor.  Fill these as soon as possible after surgery and take the medication as directed. Pain medications will not be refilled on weekends and must be approved by the doctor. 14. Remove nail polish on the operative foot and avoid getting pedicures prior to surgery. 15. Wash the night before surgery.  The night before surgery wash the foot and leg well with water and the antibacterial soap provided. Be sure to pay special attention to beneath the toenails and in between the toes.  Wash for at least three (3) minutes. Rinse thoroughly with water and dry well with a towel.  Perform this wash unless told not to do so by your physician.  Enclosed: 1 Ice pack (please put in freezer the night before surgery)   1 Hibiclens skin cleaner     Pre-op instructions  If you have any questions regarding the instructions, please do not hesitate to call our office.  [place]: [address], [place], [place] [address] -- [phone]  [place]: [address] [place], [place] [address] -- [phone]  [place]: [address]  [place], [place] [address] -- [phone]  [place]: [address], [address], [place],  [address] -- [phone]  Website:  [email or web address] 

## 2018-07-30 ENCOUNTER — Encounter: Payer: Self-pay | Admitting: Podiatry

## 2018-08-11 ENCOUNTER — Ambulatory Visit (INDEPENDENT_AMBULATORY_CARE_PROVIDER_SITE_OTHER): Payer: BC Managed Care – PPO | Admitting: Nurse Practitioner

## 2018-08-11 ENCOUNTER — Encounter (INDEPENDENT_AMBULATORY_CARE_PROVIDER_SITE_OTHER): Payer: Self-pay | Admitting: Vascular Surgery

## 2018-08-11 VITALS — BP 134/86 | HR 59 | Resp 14 | Ht 71.0 in | Wt 220.0 lb

## 2018-08-11 DIAGNOSIS — E781 Pure hyperglyceridemia: Secondary | ICD-10-CM | POA: Diagnosis not present

## 2018-08-11 DIAGNOSIS — R600 Localized edema: Secondary | ICD-10-CM

## 2018-08-11 DIAGNOSIS — I825Y2 Chronic embolism and thrombosis of unspecified deep veins of left proximal lower extremity: Secondary | ICD-10-CM

## 2018-08-11 NOTE — Progress Notes (Signed)
Subjective:    Patient ID: Harold Schwartz, male    DOB: 1956/02/09, 62 y.o.   MRN: [id number] Chief Complaint  Patient presents with  . Follow-up    Discuss DVT    HPI  Harold Schwartz is a 62 y.o. male who presents with questions regarding his DVT with follow-up.  Harold Schwartz is scheduled for foot surgery on 08/22/2018.  In March 2019 he had a recurrent left lower extremity DVT, for which he was put on lifelong Eliquis anticoagulation.  The patient has questions regarding swelling of his lower extremities, as well as questioned about wound healing following his surgery.  Patient denies any fever, chills, nausea, vomiting, diarrhea.  He denies any chest pain or shortness of breath.  He denies any nonhealing ulcers, claudication, or rest pain. Constitutional: [] Weight loss  [] Fever  [] Chills Cardiac: [] Chest pain   [] Chest pressure   [] Palpitations   [] Shortness of breath when laying flat   [] Shortness of breath with exertion. Vascular:  [] Pain in legs with walking   [] Pain in legs with standing  [x] History of DVT   [] Phlebitis   [x] Swelling in legs   [] Varicose veins   [] Non-healing ulcers Pulmonary:   [] Uses home oxygen   [] Productive cough   [] Hemoptysis   [] Wheeze  [] COPD   [] Asthma Neurologic:  [] Dizziness   [] Seizures   [] History of stroke   [] History of TIA  [] Aphasia   [] Vissual changes   [] Weakness or numbness in arm   [] Weakness or numbness in leg Musculoskeletal:   [] Joint swelling   [] Joint pain   [] Low back pain Hematologic:  [] Easy bruising  [] Easy bleeding   [x] Hypercoagulable state   [] Anemic Gastrointestinal:  [] Diarrhea   [] Vomiting  [] Gastroesophageal reflux/heartburn   [] Difficulty swallowing. Genitourinary:  [] Chronic kidney disease   [] Difficult urination  [] Frequent urination   [] Blood in urine Skin:  [] Rashes   [] Ulcers  Psychological:  [] History of anxiety   []  History of major depression.     Objective:   Physical Exam  BP 134/86 (BP Location: Right Arm, Patient  Position: Sitting)   Pulse (!) 59   Resp 14   Ht 5\' 11"  (1.803 m)   Wt 220 lb (99.8 kg)   BMI 30.68 kg/m   Past Medical History:  Diagnosis Date  . Atrial flutter ([place])   . Colon polyps   . Depression   . DVT (deep vein thrombosis) in pregnancy [facility])    last one in March 2019 taking eliquis  . Genital herpes   . Heart murmur   . Hemorrhoids   . Hyperlipidemia   . Hypogonadism in male   . Neuromuscular disorder ([place])    slight numbnes in right foot     Gen: WD/WN, NAD Head: [place]/AT, No temporalis wasting.  Ear/Nose/Throat: Hearing grossly intact, nares w/o erythema or drainage Eyes: PER, EOMI, sclera nonicteric.  Neck: Supple, no masses.  No JVD.  Pulmonary:  Good air movement, no use of accessory muscles.  Cardiac: RRR Vascular:  Vessel Right Left  PT  palpable  palpable  Gastrointestinal: soft, non-distended. No guarding/no peritoneal signs.  Musculoskeletal: M/S 5/5 throughout.  No deformity or atrophy.  Neurologic: Pain and light touch intact in extremities.  Symmetrical.  Speech is fluent. Motor exam as listed above. Psychiatric: Judgment intact, Mood & affect appropriate for pt's clinical situation. Dermatologic: No Venous rashes. No Ulcers Noted.  No changes consistent with cellulitis. Lymph : No Cervical lymphadenopathy, no lichenification or skin changes of chronic lymphedema.  Social History   Socioeconomic History  . Marital status: Married    Spouse name: Not on file  . Number of children: 2  . Years of education: Not on file  . Highest education level: Not on file  Occupational History  . Occupation: Engineer, site  Social Needs  . Financial resource strain: Not on file  . Food insecurity:    Worry: Not on file    Inability: Not on file  . Transportation needs:    Medical: Not on file    Non-medical: Not on file  Tobacco Use  . Smoking status: Never Smoker  . Smokeless tobacco: Never Used  Substance and Sexual Activity  . Alcohol use: Yes     Alcohol/week: 7.0 standard drinks    Types: 7 Shots of liquor per week    Comment: moderate  . Drug use: No  . Sexual activity: Not on file  Lifestyle  . Physical activity:    Days per week: Not on file    Minutes per session: Not on file  . Stress: Not on file  Relationships  . Social connections:    Talks on phone: Not on file    Gets together: Not on file    Attends religious service: Not on file    Active member of club or organization: Not on file    Attends meetings of clubs or organizations: Not on file    Relationship status: Not on file  . Intimate partner violence:    Fear of current or ex partner: Not on file    Emotionally abused: Not on file    Physically abused: Not on file    Forced sexual activity: Not on file  Other Topics Concern  . Not on file  Social History Narrative  . Not on file    Past Surgical History:  Procedure Laterality Date  . A-FLUTTER ABLATION N/A 03/22/2017   Procedure: A-Flutter Ablation;  Surgeon: Deboraha Sprang, MD;  Location: [facility];  Service: Cardiovascular;  Laterality: N/A;  . COLONOSCOPY WITH PROPOFOL N/A 05/16/2018   Procedure: COLONOSCOPY WITH PROPOFOL;  Surgeon: Lucilla Lame, MD;  Location: [facility];  Service: Endoscopy;  Laterality: N/A;  . KNEE ARTHROSCOPY    . POLYPECTOMY  05/16/2018   Procedure: POLYPECTOMY;  Surgeon: Lucilla Lame, MD;  Location: [facility];  Service: Endoscopy;;  . VASECTOMY  1990    Family History  Problem Relation Age of Onset  . Breast cancer Mother   . Heart disease Mother   . Hyperlipidemia Mother   . Hypertension Mother   . Colon cancer Father   . Heart disease Father   . Alzheimer's disease Father   . Hyperlipidemia Father   . Hyperlipidemia Brother     No Known Allergies     Assessment & Plan:   1. Chronic deep vein thrombosis (DVT) of proximal vein of left lower extremity [facility]) Patient will follow recommendations from his primary care provider  regarding Eliquis during this.  We spoke at length about his swelling, and what can be done to assist with it especially in the setting of postphlebitic syndrome.  We also spoke about wound healing, and what he is generally comprised of.  This surgery will take some time to heal due to its extensiveness.  The patient had strong palpable pulses on his lower right extremity.  I advised him that with his physical assessment and lack of history regarding peripheral vascular disease, the risk of delayed wound  healing due to compromised circulation would be minimal.  Offered to have an ABI done prior to the surgery, however patient declined at the time.  Reviewed signs and symptoms of a DVT with the patient again, due to his upcoming surgery and likely period of immobility.  Patient will follow-up as needed.  2. Hypertriglyceridemia Continue anti-lipid medication  as ordered and reviewed, no changes at this time   3. Lower extremity edema I have had a long discussion with the patient regarding swelling and why it  causes symptoms.  We discussed causes of postphlebitic syndrome. patient will begin wearing graduated compression stockings class 1 (20-30 mmHg) on a daily basis a prescription was given. The patient will  beginning wearing the stockings first thing in the morning and removing them in the evening. The patient is instructed specifically not to sleep in the stockings.   In addition, behavioral modification will be initiated.  This will include frequent elevation, use of over the counter pain medications and exercise such as walking.      Current Outpatient Medications on File Prior to Visit  Medication Sig Dispense Refill  . alfuzosin (UROXATRAL) 10 MG 24 hr tablet TAKE 1 TABLET BY MOUTH EVERYDAY AT BEDTIME  3  . apixaban (ELIQUIS) 5 MG TABS tablet Take 1 tablet (5 mg total) by mouth 2 (two) times daily. 180 tablet 3  . Celery Seed OIL by Does not apply route.    . cyclobenzaprine  (FLEXERIL) 10 MG tablet Take 1 tablet (10 mg total) by mouth 3 times/day as needed-between meals & bedtime for muscle spasms. 30 tablet 0  . ezetimibe (ZETIA) 10 MG tablet Take 1 tablet (10 mg total) by mouth daily. 30 tablet 10  . gabapentin (NEURONTIN) 100 MG capsule     . glucosamine-chondroitin 500-400 MG tablet Take 1 tablet by mouth 2 (two) times daily.    . Magnesium 300 MG CAPS Take 1 capsule by mouth daily.    . meloxicam (MOBIC) 15 MG tablet     . NON FORMULARY Take 1 capsule by mouth 2 (two) times daily. Celery seed extract     . Omega-3 Fatty Acids (FISH OIL) 1200 MG CAPS Take 2,400-3,600 capsules by mouth daily. 2400 mg in the morning and 3600 mg in the evening    . Testosterone 30 MG/ACT SOLN APPLY/PLACE 1 PUMP UNDER EACH ARMPIT ONCE DAILY 90 mL 0  . valACYclovir (VALTREX) 1000 MG tablet Take 1 tablet (1,000 mg total) by mouth daily. 90 tablet 3   No current facility-administered medications on file prior to visit.     There are no Patient Instructions on file for this visit. No follow-ups on file.   Kris Hartmann, NP

## 2018-08-13 ENCOUNTER — Encounter: Payer: Self-pay | Admitting: *Deleted

## 2018-08-15 ENCOUNTER — Encounter: Payer: Self-pay | Admitting: Podiatry

## 2018-08-19 ENCOUNTER — Other Ambulatory Visit: Payer: Self-pay | Admitting: Internal Medicine

## 2018-08-19 ENCOUNTER — Encounter: Payer: Self-pay | Admitting: *Deleted

## 2018-08-19 ENCOUNTER — Telehealth: Payer: Self-pay | Admitting: *Deleted

## 2018-08-19 DIAGNOSIS — E349 Endocrine disorder, unspecified: Secondary | ICD-10-CM

## 2018-08-19 NOTE — Telephone Encounter (Signed)
-----   Message from Sela Hua, [name] sent at 08/19/2018  9:47 AM EDT ----- From a vascular standpoint there is no contraindication. He should stop his Eliquis two days before surgery. This was also discussed with Dr. Lucky Cowboy.  Thanks.  ----- Message ----- From: Lolita Rieger Sent: 08/19/2018   9:19 AM EDT To: Deboraha Sprang, MD, Sela Hua, [name]

## 2018-08-19 NOTE — Progress Notes (Signed)
I sent medical clearance letters to Dr. Caryl Comes and to [facility], [name].  I inquired about instructions for stopping Eliquis prior to surgery.

## 2018-08-19 NOTE — Telephone Encounter (Signed)
I called and informed Harold Schwartz that his PA, Marcelle Overlie said it was okay for him to stop his Eliquis two days prior to his surgery date.  He stated he is aware, that she had already told him at his last appointment.

## 2018-08-20 ENCOUNTER — Encounter: Payer: Self-pay | Admitting: Podiatry

## 2018-08-20 ENCOUNTER — Telehealth: Payer: Self-pay | Admitting: *Deleted

## 2018-08-20 NOTE — Telephone Encounter (Signed)
-----   Message from Garrel Ridgel, [place] sent at 08/20/2018  7:15 AM EDT ----- He is scheduled for surgery and findings are consistent with what we expected.

## 2018-08-20 NOTE — Telephone Encounter (Signed)
I informed pt of Dr. Hyatt's review of results. 

## 2018-08-21 ENCOUNTER — Encounter: Payer: Self-pay | Admitting: Internal Medicine

## 2018-08-21 ENCOUNTER — Other Ambulatory Visit: Payer: Self-pay | Admitting: Podiatry

## 2018-08-21 DIAGNOSIS — E349 Endocrine disorder, unspecified: Secondary | ICD-10-CM

## 2018-08-21 DIAGNOSIS — E291 Testicular hypofunction: Secondary | ICD-10-CM

## 2018-08-21 MED ORDER — TESTOSTERONE 30 MG/ACT TD SOLN: TRANSDERMAL | 0 refills | Status: DC

## 2018-08-21 MED ORDER — ONDANSETRON HCL 4 MG PO TABS: 4.0000 mg | ORAL_TABLET | Freq: Three times a day (TID) | ORAL | 0 refills | Status: DC | PRN

## 2018-08-21 MED ORDER — CEPHALEXIN 500 MG PO CAPS: 500.0000 mg | ORAL_CAPSULE | Freq: Three times a day (TID) | ORAL | 0 refills | Status: DC

## 2018-08-21 MED ORDER — OXYCODONE-ACETAMINOPHEN 10-325 MG PO TABS: 1.0000 | ORAL_TABLET | Freq: Four times a day (QID) | ORAL | 0 refills | Status: AC | PRN

## 2018-08-21 NOTE — Telephone Encounter (Signed)
Pt is having foot surgery tomorrow, so he said he will call to schedule a lab only appointment... Lab future ordered... Please advise on refill

## 2018-08-22 ENCOUNTER — Encounter: Payer: Self-pay | Admitting: Podiatry

## 2018-08-22 DIAGNOSIS — M24574 Contracture, right foot: Secondary | ICD-10-CM | POA: Diagnosis not present

## 2018-08-22 DIAGNOSIS — M7661 Achilles tendinitis, right leg: Secondary | ICD-10-CM | POA: Diagnosis not present

## 2018-08-22 DIAGNOSIS — M722 Plantar fascial fibromatosis: Secondary | ICD-10-CM | POA: Diagnosis not present

## 2018-08-22 DIAGNOSIS — M7731 Calcaneal spur, right foot: Secondary | ICD-10-CM | POA: Diagnosis not present

## 2018-08-22 HISTORY — PX: TENOTOMY ACHILLES TENDON: SUR1337

## 2018-08-27 ENCOUNTER — Ambulatory Visit (INDEPENDENT_AMBULATORY_CARE_PROVIDER_SITE_OTHER): Payer: BC Managed Care – PPO

## 2018-08-27 ENCOUNTER — Encounter: Payer: Self-pay | Admitting: Podiatry

## 2018-08-27 ENCOUNTER — Ambulatory Visit (INDEPENDENT_AMBULATORY_CARE_PROVIDER_SITE_OTHER): Payer: BC Managed Care – PPO | Admitting: Podiatry

## 2018-08-27 VITALS — BP 128/79 | HR 69 | Temp 98.0°F

## 2018-08-27 DIAGNOSIS — M7661 Achilles tendinitis, right leg: Secondary | ICD-10-CM

## 2018-08-27 DIAGNOSIS — M722 Plantar fascial fibromatosis: Secondary | ICD-10-CM

## 2018-08-27 DIAGNOSIS — Z9889 Other specified postprocedural states: Secondary | ICD-10-CM

## 2018-08-27 NOTE — Progress Notes (Signed)
He presents today for his first postop visit date of surgery August 22, 2018 status post EPF and gastroc recession retrocalcaneal he is tenolysis retrocalcaneal heel spur resection and cast application.  He states that he is been doing very well denies fever chills nausea vomiting muscle aches and pains states that the cast has brought him on his shin a little bit.  Objective: Vital signs are stable he is alert and oriented x3.  Toes are visible and have full range of motion normal sensation normal color cast is loose proximally.  Radiographs taken today demonstrate no gas in the tissues and staples appear to be intact.  Assessment: Well-healing surgical foot cast right.  Plan: Follow-up with [place] in 1 week for cast removal.  He will also have a new cast applied.

## 2018-09-03 ENCOUNTER — Ambulatory Visit (INDEPENDENT_AMBULATORY_CARE_PROVIDER_SITE_OTHER): Payer: BC Managed Care – PPO | Admitting: Podiatry

## 2018-09-03 ENCOUNTER — Encounter: Payer: BC Managed Care – PPO | Admitting: Podiatry

## 2018-09-03 DIAGNOSIS — M7661 Achilles tendinitis, right leg: Secondary | ICD-10-CM

## 2018-09-03 DIAGNOSIS — M722 Plantar fascial fibromatosis: Secondary | ICD-10-CM

## 2018-09-03 DIAGNOSIS — Z9889 Other specified postprocedural states: Secondary | ICD-10-CM

## 2018-09-03 NOTE — Progress Notes (Signed)
He presents today for follow-up of his endoscopic plantar fasciotomy Achilles tenolysis retrocalcaneal heel spur resection and gastroc recession with cast.  He states that he is doing very well denies fever chills nausea vomiting muscle aches and pains.  Objective: Presents today dry sterile cast intact was removed demonstrates dry sterile dressing intact once removed demonstrates staples intact margins well coapted minimal edema no erythema cellulitis drainage or odor.  He has good plantar flexion against resistance.  Mild tenderness in the calf.  The calf is not swollen nor is it warm to touch.  Assessment: Well-healing surgical foot leg right.  Plan: Redressed the foot today dressed a compressive dressing applied a new cast to the right foot and leg and will follow-up with him in 2 weeks.  He will remain nonweightbearing.

## 2018-09-18 ENCOUNTER — Other Ambulatory Visit (INDEPENDENT_AMBULATORY_CARE_PROVIDER_SITE_OTHER): Payer: BC Managed Care – PPO

## 2018-09-18 ENCOUNTER — Ambulatory Visit (INDEPENDENT_AMBULATORY_CARE_PROVIDER_SITE_OTHER): Payer: BC Managed Care – PPO | Admitting: Podiatry

## 2018-09-18 VITALS — Temp 97.1°F

## 2018-09-18 DIAGNOSIS — Z9889 Other specified postprocedural states: Secondary | ICD-10-CM

## 2018-09-18 DIAGNOSIS — E291 Testicular hypofunction: Secondary | ICD-10-CM

## 2018-09-18 DIAGNOSIS — M7661 Achilles tendinitis, right leg: Secondary | ICD-10-CM

## 2018-09-18 DIAGNOSIS — M722 Plantar fascial fibromatosis: Secondary | ICD-10-CM

## 2018-09-18 LAB — TESTOSTERONE: TESTOSTERONE: 265.35 ng/dL — AB (ref 300.00–890.00)

## 2018-09-18 NOTE — Progress Notes (Signed)
He presents today nearly 4 weeks status post retrocalcaneal heel spur resection and endoscopic plantar fasciotomy gastroc recession and Achilles tenolysis.  He states that he states that the cast has been comfortable.  Denies fever chills nausea vomiting muscle aches pains calf pain back pain chest pain shortness of breath.  Objective: Presents today with cast intact to the right lower extremity.  Vital signs are stable alert and oriented x3.  Cast was removed dry sterile dressing was intact.  Staples were removed margins remain well coapted.  No signs of infection.  Assessment: Well-healing surgical foot.  Plan: Place Steri-Strips today and will allow him to start getting this wet he will continue nonweightbearing for the next week partial weightbearing for the week after that and full weightbearing week after that.  I will see him in 3 weeks.

## 2018-09-19 ENCOUNTER — Encounter: Payer: Self-pay | Admitting: Podiatry

## 2018-09-24 NOTE — Progress Notes (Signed)
DOS: 08-22-2018 RT;  Endoscopic Plantar Fasciotomy Gastroc Recession Achilles Tendolysis Heel Spur Resection and Cast Application  Dr. Milinda Pointer  [facility]

## 2018-09-25 ENCOUNTER — Encounter: Payer: Self-pay | Admitting: Podiatry

## 2018-09-25 ENCOUNTER — Other Ambulatory Visit: Payer: Self-pay | Admitting: Internal Medicine

## 2018-09-25 DIAGNOSIS — E349 Endocrine disorder, unspecified: Secondary | ICD-10-CM

## 2018-09-25 NOTE — Telephone Encounter (Signed)
More Detail >>  Visit Follow-Up Question  Harold Schwartz  Sent: Thu September 25, 2018 7:53 AM  To: [facility] [place] [facility]      Message   I'm scheduled to start partial weight bearing today, but mt heel incision isn't yet closed. We've been steri-stripping it daily with neosporin. it is more open today than yesterday. Should I delay partial weight bearing until it closes up?

## 2018-09-26 NOTE — Telephone Encounter (Signed)
Last filled 08/25/18 and last lab was done on 09/18/18 and was low, you instructed to continue current therapy

## 2018-09-29 ENCOUNTER — Encounter: Payer: Self-pay | Admitting: Podiatry

## 2018-09-29 MED ORDER — CADEXOMER IODINE 0.9 % EX GEL: 1.0000 "application " | Freq: Every day | CUTANEOUS | 5 refills | Status: DC | PRN

## 2018-09-29 NOTE — Telephone Encounter (Signed)
I called pt and informed of Dr. Stephenie Acres orders to keep area clean and dry apply iodosorb and a dry dressing to the area, perform dressing changes until seen in office. Pt asked if he should continue with the steri-strips, which he had initially been taking off, but had found that was not a good idea. I told pt to quickly clean the area with soap and water, pat dry, then apply iodosorb and dry sterile dressing. Pt ask if the Miraplex dressing would be a good dressing and I told him I may hold too much moisture in. Pt states understanding.

## 2018-10-10 ENCOUNTER — Encounter: Payer: Self-pay | Admitting: Podiatry

## 2018-10-13 ENCOUNTER — Encounter: Payer: Self-pay | Admitting: Podiatry

## 2018-10-15 ENCOUNTER — Ambulatory Visit (INDEPENDENT_AMBULATORY_CARE_PROVIDER_SITE_OTHER): Payer: BC Managed Care – PPO | Admitting: Podiatry

## 2018-10-15 ENCOUNTER — Encounter: Payer: Self-pay | Admitting: Podiatry

## 2018-10-15 DIAGNOSIS — Z9889 Other specified postprocedural states: Secondary | ICD-10-CM

## 2018-10-15 DIAGNOSIS — M7661 Achilles tendinitis, right leg: Secondary | ICD-10-CM

## 2018-10-15 DIAGNOSIS — M722 Plantar fascial fibromatosis: Secondary | ICD-10-CM

## 2018-10-15 NOTE — Progress Notes (Signed)
He presents today for follow-up of his surgical foot.  He is status post endoscopic plantar fasciotomy Achilles tenolysis retrocalcaneal spur resection gastroc recession and cast application.  Date of surgery August 22, 2018 right foot.  He states that everything would be great if the wound would heal up.  He continues to dress the wound daily with Iodosorb gel.  Denies calf pain chest pain shortness of breath.  Objective: Signs stable he is alert and oriented x3 the posterior aspect of his inferior wound is almost healed.  Is one small area of granulation tissue which I sharply resected to bleeding today.  There is no purulence no malodor no dehiscence.  This is probably a subcutaneous not from the suture.  Assessment: Slowly healing wound and well-healing surgical foot.  Plan: Would allow him start partial weightbearing dress the foot during the day with Iodosorb and leave open to air to some degree.

## 2018-10-16 ENCOUNTER — Encounter: Payer: BC Managed Care – PPO | Admitting: Podiatry

## 2018-10-21 ENCOUNTER — Encounter: Payer: BC Managed Care – PPO | Admitting: Podiatry

## 2018-10-24 ENCOUNTER — Encounter: Payer: Self-pay | Admitting: Internal Medicine

## 2018-10-24 ENCOUNTER — Encounter: Payer: Self-pay | Admitting: Podiatry

## 2018-10-28 NOTE — Telephone Encounter (Signed)
Rx for Nicole [facility] has been faxed.

## 2018-11-06 ENCOUNTER — Ambulatory Visit: Payer: BC Managed Care – PPO

## 2018-11-09 ENCOUNTER — Encounter: Payer: Self-pay | Admitting: Podiatry

## 2018-12-02 ENCOUNTER — Other Ambulatory Visit: Payer: Self-pay | Admitting: Internal Medicine

## 2018-12-02 DIAGNOSIS — E349 Endocrine disorder, unspecified: Secondary | ICD-10-CM

## 2018-12-03 NOTE — Telephone Encounter (Signed)
Last filled 09/26/2018, last lab 09/2018...Marland Kitchen please advise

## 2018-12-24 ENCOUNTER — Encounter: Payer: Self-pay | Admitting: Family Medicine

## 2018-12-24 ENCOUNTER — Ambulatory Visit: Payer: BC Managed Care – PPO | Admitting: Family Medicine

## 2018-12-24 VITALS — BP 138/84 | HR 79 | Temp 98.4°F | Ht 71.0 in | Wt 222.0 lb

## 2018-12-24 DIAGNOSIS — J069 Acute upper respiratory infection, unspecified: Secondary | ICD-10-CM

## 2018-12-24 DIAGNOSIS — H1033 Unspecified acute conjunctivitis, bilateral: Secondary | ICD-10-CM | POA: Diagnosis not present

## 2018-12-24 MED ORDER — POLYMYXIN B-TRIMETHOPRIM 10000-0.1 UNIT/ML-% OP SOLN: 1.0000 [drp] | Freq: Four times a day (QID) | OPHTHALMIC | 0 refills | Status: DC

## 2018-12-24 NOTE — Assessment & Plan Note (Signed)
Anticipate viral. Supportive care reviewed with lubricating eye drops and cool compresses. Rx for abx eye drop with indications when to fill.

## 2018-12-24 NOTE — Patient Instructions (Signed)
You do have upper respiratory infection with pink eye - likely viral. Supportive care as up to now, fluids, rest, nyquil and mucinex.  Antibiotic drops sent to pharmacy in case drainage worsening or not improving over next few days.   Viral Conjunctivitis, Adult  Viral conjunctivitis is an inflammation of the clear membrane that covers the white part of your eye and the inner surface of your eyelid (conjunctiva). The inflammation is caused by a viral infection. The blood vessels in the conjunctiva become inflamed, causing the eye to become red or pink, and often itchy. Viral conjunctivitis can be easily passed from one person to another (is contagious). This condition is often called pink eye. What are the causes? This condition is caused by a virus. A virus is a type of contagious germ. It can be spread by touching objects that have been contaminated with the virus, such as doorknobs or towels. It can also be passed through droplets, such as from coughing or sneezing. What are the signs or symptoms? Symptoms of this condition include:  Eye redness.  Tearing or watery eyes.  Itchy and irritated eyes.  Burning feeling in the eyes.  Clear drainage from the eye.  Swollen eyelids.  A gritty feeling in the eye.  Light sensitivity. This condition often occurs with other symptoms, such as a fever, nausea, or a rash. How is this diagnosed? This condition is diagnosed with a medical history and physical exam. If you have discharge from your eye, the discharge may be tested to rule out other causes of conjunctivitis. How is this treated? Viral conjunctivitis does not respond to medicines that kill bacteria (antibiotics). Treatment for viral conjunctivitis is directed at stopping a bacterial infection from developing in addition to the viral infection. Treatment also aims to relieve your symptoms, such as itching. This may be done with antihistamine drops or other eye medicines. Rarely, steroid  eye drops or antiviral medicines may be prescribed. Follow these instructions at home: Medicines   Take or apply over-the-counter and prescription medicines only as told by your health care provider.  Be very careful to avoid touching the edge of the eyelid with the eye drop bottle or ointment tube when applying medicines to the affected eye. Being careful this way will stop you from spreading the infection to the other eye or to other people. Eye care  Avoid touching or rubbing your eyes.  Apply a warm, wet, clean washcloth to your eye for 10-20 minutes, 3-4 times per day or as told by your health care provider.  If you wear contact lenses, do not wear them until the inflammation is gone and your health care provider says it is safe to wear them again. Ask your health care provider how to sterilize or replace your contact lenses before using them again. Wear glasses until you can resume wearing contacts.  Avoid wearing eye makeup until the inflammation is gone. Throw away any old eye cosmetics that may be contaminated.  Gently wipe away any drainage from your eye with a warm, wet washcloth or a cotton ball. General instructions  Change or wash your pillowcase every day or as told by your health care provider.  Do not share towels, pillowcases, washcloths, eye makeup, makeup brushes, contact lenses, or glasses. This may spread the infection.  Wash your hands often with soap and water. Use paper towels to dry your hands. If soap and water are not available, use hand sanitizer.  Try to avoid contact with other people for  one week or as told by your health care provider. Contact a health care provider if:  Your symptoms do not improve with treatment or they get worse.  You have increased pain.  Your vision becomes blurry.  You have a fever.  You have facial pain, redness, or swelling.  You have yellow or green drainage coming from your eye.  You have new symptoms. This  information is not intended to replace advice given to you by your health care provider. Make sure you discuss any questions you have with your health care provider. Document Released: 02/23/2003 Document Revised: 06/30/2016 Document Reviewed: 06/19/2016 Elsevier Interactive Patient Education  2019 [employer].

## 2018-12-24 NOTE — Addendum Note (Signed)
Addended by: Ria Bush on: 12/24/2018 12:39 PM   Modules accepted: Level of Service

## 2018-12-24 NOTE — Assessment & Plan Note (Signed)
Anticipate viral given short duration and no localizing symptoms. Supportive care as per instructions. Update if not improving with treatment. Pt agrees with plan.

## 2018-12-24 NOTE — Progress Notes (Signed)
BP 138/84 (BP Location: Left Arm, Patient Position: Sitting, Cuff Size: Normal)   Pulse 79   Temp 98.4 F (36.9 C) (Oral)   Ht 5\' 11"  (1.803 m)   Wt 222 lb (100.7 kg)   SpO2 97%   BMI 30.96 kg/m    CC: cough, congestion Subjective:    Patient ID: Harold Schwartz, male    DOB: 05-Nov-1956, 63 y.o.   MRN: [id number]  HPI: Harold Schwartz is a 63 y.o. male presenting on 12/24/2018 for Cough (C/o productive cough- worse at night, body aches and postnasal drip. Sxs started 12/20/17. ) and Eye Problem (Noticed bilateral eye drainage last night. This mornning eyes were matted shut and have redness. )   5d h/o cough, productive congestion, last night eyes matted shut again this morning. Red eyes. No vision changes or pain with eye movement. Head congestion. Some tooth pain and body aches initially. Some trouble sleeping due to symptoms. Cough affecting sleep, worse cough at night time.   No fevers/chills, ear pain, dyspnea or wheezing.   Has tried mucinex and nyquil at night. Sick contacts at home (grand daughters).  No h/o asthma. No h/o allergic rhinitis.  No smokers at home.      Relevant past medical, surgical, family and social history reviewed and updated as indicated. Interim medical history since our last visit reviewed. Allergies and medications reviewed and updated. Outpatient Medications Prior to Visit  Medication Sig Dispense Refill  . alfuzosin (UROXATRAL) 10 MG 24 hr tablet TAKE 1 TABLET BY MOUTH EVERYDAY AT BEDTIME  3  . apixaban (ELIQUIS) 5 MG TABS tablet Take 1 tablet (5 mg total) by mouth 2 (two) times daily. 180 tablet 3  . cadexomer iodine (IODOSORB) 0.9 % gel Apply 1 application topically daily as needed for wound care. 40 g 5  . Celery Seed OIL by Does not apply route.    . cyclobenzaprine (FLEXERIL) 10 MG tablet Take 1 tablet (10 mg total) by mouth 3 times/day as needed-between meals & bedtime for muscle spasms. 30 tablet 0  . ezetimibe (ZETIA) 10 MG tablet Take 1 tablet  (10 mg total) by mouth daily. MUST SCHEDULE ANNUAL PHYSICAL 30 tablet 0  . glucosamine-chondroitin 500-400 MG tablet Take 1 tablet by mouth 2 (two) times daily.    . Magnesium 300 MG CAPS Take 1 capsule by mouth daily.    . NON FORMULARY Take 1 capsule by mouth 2 (two) times daily. Celery seed extract     . Omega-3 Fatty Acids (FISH OIL) 1200 MG CAPS Take 2,400-3,600 capsules by mouth daily. 2400 mg in the morning and 3600 mg in the evening    . Testosterone 30 MG/ACT SOLN APPLY/PLACE 1 PUMP UNDER EACH ARMPIT ONCE DAILY 90 mL 0  . Testosterone 30 MG/ACT SOLN APPLY 1 PUMP UNDER EACH ARMPIT ONCE DAILY MUST SCHEDULE ANNUAL EXAM 90 mL 0  . valACYclovir (VALTREX) 1000 MG tablet Take 1 tablet (1,000 mg total) by mouth daily. 90 tablet 3  . cephALEXin (KEFLEX) 500 MG capsule Take 1 capsule (500 mg total) by mouth 3 (three) times daily. 30 capsule 0  . gabapentin (NEURONTIN) 100 MG capsule     . meloxicam (MOBIC) 15 MG tablet     . ondansetron (ZOFRAN) 4 MG tablet Take 1 tablet (4 mg total) by mouth every 8 (eight) hours as needed for nausea or vomiting. 20 tablet 0   No facility-administered medications prior to visit.      Per HPI unless specifically indicated  in ROS section below Review of Systems Objective:    BP 138/84 (BP Location: Left Arm, Patient Position: Sitting, Cuff Size: Normal)   Pulse 79   Temp 98.4 F (36.9 C) (Oral)   Ht 5\' 11"  (1.803 m)   Wt 222 lb (100.7 kg)   SpO2 97%   BMI 30.96 kg/m   Wt Readings from Last 3 Encounters:  12/24/18 222 lb (100.7 kg)  08/11/18 220 lb (99.8 kg)  06/30/18 219 lb (99.3 kg)    Physical Exam Vitals signs and nursing note reviewed.  Constitutional:      General: He is not in acute distress.    Appearance: He is well-developed.  HENT:     Head: Normocephalic and atraumatic.     Right Ear: Hearing, tympanic membrane, ear canal and external ear normal.     Left Ear: Hearing, tympanic membrane, ear canal and external ear normal.     Nose:  Mucosal edema and rhinorrhea present.     Right Sinus: No maxillary sinus tenderness or frontal sinus tenderness.     Left Sinus: No maxillary sinus tenderness or frontal sinus tenderness.     Mouth/Throat:     Mouth: Mucous membranes are moist.     Pharynx: Uvula midline. Posterior oropharyngeal erythema present. No oropharyngeal exudate.     Tonsils: No tonsillar abscesses.  Eyes:     General: No scleral icterus.    Extraocular Movements: Extraocular movements intact.     Conjunctiva/sclera:     Right eye: Right conjunctiva is injected.     Left eye: Left conjunctiva is injected.     Pupils: Pupils are equal, round, and reactive to light.     Comments: Bilateral bulbar and palpebral conjunctival erythema with limbic sparing Scant discharge present at nasal corners of eyes  Neck:     Musculoskeletal: Normal range of motion and neck supple.  Cardiovascular:     Rate and Rhythm: Normal rate and regular rhythm.     Heart sounds: Normal heart sounds. No murmur.  Pulmonary:     Effort: Pulmonary effort is normal. No respiratory distress.     Breath sounds: Normal breath sounds. No wheezing or rales.     Comments: Lungs clear Lymphadenopathy:     Cervical: Cervical adenopathy (R PC LAD) present.  Skin:    General: Skin is warm and dry.     Findings: No rash.       Assessment & Plan:   Problem List Items Addressed This Visit    URI with cough and congestion - Primary    Anticipate viral given short duration and no localizing symptoms. Supportive care as per instructions. Update if not improving with treatment. Pt agrees with plan.       Acute conjunctivitis of both eyes    Anticipate viral. Supportive care reviewed with lubricating eye drops and cool compresses. Rx for abx eye drop with indications when to fill.           Meds ordered this encounter  Medications  . trimethoprim-polymyxin b (POLYTRIM) ophthalmic solution    Sig: Place 1 drop into both eyes every 6 (six)  hours.    Dispense:  10 mL    Refill:  0   No orders of the defined types were placed in this encounter.   Follow up plan: Return if symptoms worsen or fail to improve.  Ria Bush, MD

## 2018-12-29 ENCOUNTER — Telehealth: Payer: Self-pay

## 2018-12-29 MED ORDER — GUAIFENESIN-CODEINE 100-10 MG/5ML PO SYRP: 5.0000 mL | ORAL_SOLUTION | Freq: Two times a day (BID) | ORAL | 0 refills | Status: DC | PRN

## 2018-12-29 NOTE — Telephone Encounter (Signed)
plz notify codeine cough syrup sent to pharmacy.

## 2018-12-29 NOTE — Telephone Encounter (Signed)
Spoke with pt notifying pt rx was sent to the pharmacy. Pt expresses his thanks.

## 2018-12-29 NOTE — Telephone Encounter (Signed)
Pt was seen on 12/24/18.Please advise.

## 2018-12-29 NOTE — Telephone Encounter (Signed)
[facility] Patient Name: Harold Schwartz Gender: Male DOB: 03-17-1956 Age: 63 Y 11 M 17 D Return Phone Number: [phone] (Primary) Address: City/State/Zip: [place] [address] Client [facility] Night - Client Client Site [place] Physician Ria Bush - MD Contact Type Call Who Is Calling Patient / Member / Family / Caregiver Call Type Triage / Clinical Relationship To Patient Self Return Phone Number [phone] (Primary) Chief Complaint Prescription Refill or Medication Request (non symptomatic) Reason for Call Symptomatic / Request for Harold Schwartz stated he has a respiratory infection. He turned down the codeine cough medicine but thinks that he may need it now to control the cough at night. getting better but cough keeps him awake often Translation No Nurse Assessment Nurse: Chestine Spore, RN, [place] Date/Time (Eastern Time): 12/29/2018 7:14:44 AM Please select the assessment type ---Verbal order / New medication order Does the client directives allow for assistance with medications after hours? ---No Additional Documentation ---caller states having a cough. Dr. Christinia Gully rec cough med with codeine to patient when DX resp infection. caller states he declined med at time of evaluation. changed his mind and would like to get a prescription of it now. caller states he can pick up a paper prescription from MD office or med can be called in to a pharmacy please call it to [facility] [phone] Guidelines Guideline Title Affirmed Question Affirmed Notes Nurse Date/Time Eilene Ghazi Time) Disp. Time Eilene Ghazi Time) Disposition Final User 12/29/2018 7:28:39 AM Pharmacy Call Chestine Spore, RN, Reynold Bowen Reason: med request 12/29/2018 7:28:56 AM Clinical Call Yes Chestine Spore, RN, [place]

## 2018-12-31 ENCOUNTER — Other Ambulatory Visit: Payer: Self-pay | Admitting: Internal Medicine

## 2019-01-15 ENCOUNTER — Other Ambulatory Visit: Payer: Self-pay | Admitting: Internal Medicine

## 2019-01-15 DIAGNOSIS — E349 Endocrine disorder, unspecified: Secondary | ICD-10-CM

## 2019-01-18 ENCOUNTER — Other Ambulatory Visit: Payer: Self-pay | Admitting: Internal Medicine

## 2019-01-19 NOTE — Telephone Encounter (Signed)
Last filled 12/03/2018...Marland Kitchen please advise  Overdue CPE letter mailed

## 2019-01-27 ENCOUNTER — Other Ambulatory Visit: Payer: Self-pay | Admitting: Internal Medicine

## 2019-03-02 ENCOUNTER — Encounter: Payer: Self-pay | Admitting: Internal Medicine

## 2019-03-13 ENCOUNTER — Encounter: Payer: Self-pay | Admitting: Internal Medicine

## 2019-03-13 DIAGNOSIS — E349 Endocrine disorder, unspecified: Secondary | ICD-10-CM

## 2019-03-18 ENCOUNTER — Other Ambulatory Visit (INDEPENDENT_AMBULATORY_CARE_PROVIDER_SITE_OTHER): Payer: BC Managed Care – PPO

## 2019-03-18 ENCOUNTER — Other Ambulatory Visit: Payer: Self-pay

## 2019-03-18 ENCOUNTER — Other Ambulatory Visit: Payer: Self-pay | Admitting: Internal Medicine

## 2019-03-18 ENCOUNTER — Encounter: Payer: BC Managed Care – PPO | Admitting: Internal Medicine

## 2019-03-18 DIAGNOSIS — E349 Endocrine disorder, unspecified: Secondary | ICD-10-CM

## 2019-03-18 LAB — TESTOSTERONE: Testosterone: 129.2 ng/dL — ABNORMAL LOW (ref 300.00–890.00)

## 2019-03-20 MED ORDER — TESTOSTERONE 30 MG/ACT TD SOLN: TRANSDERMAL | 1 refills | Status: DC

## 2019-03-20 NOTE — Telephone Encounter (Signed)
Pt had labs recently... msg sent for pt to set up web visit for f/u--- please advise

## 2019-03-23 ENCOUNTER — Other Ambulatory Visit: Payer: Self-pay

## 2019-03-23 ENCOUNTER — Ambulatory Visit (INDEPENDENT_AMBULATORY_CARE_PROVIDER_SITE_OTHER): Payer: BC Managed Care – PPO | Admitting: Internal Medicine

## 2019-03-23 ENCOUNTER — Encounter: Payer: Self-pay | Admitting: Internal Medicine

## 2019-03-23 DIAGNOSIS — I825Y2 Chronic embolism and thrombosis of unspecified deep veins of left proximal lower extremity: Secondary | ICD-10-CM

## 2019-03-23 DIAGNOSIS — N400 Enlarged prostate without lower urinary tract symptoms: Secondary | ICD-10-CM | POA: Insufficient documentation

## 2019-03-23 DIAGNOSIS — E349 Endocrine disorder, unspecified: Secondary | ICD-10-CM | POA: Diagnosis not present

## 2019-03-23 DIAGNOSIS — N3943 Post-void dribbling: Secondary | ICD-10-CM

## 2019-03-23 DIAGNOSIS — A6001 Herpesviral infection of penis: Secondary | ICD-10-CM

## 2019-03-23 DIAGNOSIS — E291 Testicular hypofunction: Secondary | ICD-10-CM

## 2019-03-23 DIAGNOSIS — N138 Other obstructive and reflux uropathy: Secondary | ICD-10-CM | POA: Insufficient documentation

## 2019-03-23 DIAGNOSIS — E781 Pure hyperglyceridemia: Secondary | ICD-10-CM

## 2019-03-23 DIAGNOSIS — N401 Enlarged prostate with lower urinary tract symptoms: Secondary | ICD-10-CM | POA: Insufficient documentation

## 2019-03-23 DIAGNOSIS — I4892 Unspecified atrial flutter: Secondary | ICD-10-CM

## 2019-03-23 MED ORDER — TESTOSTERONE 30 MG/ACT TD SOLN: TRANSDERMAL | 5 refills | Status: DC

## 2019-03-23 NOTE — Progress Notes (Signed)
Virtual Visit via Video Note  I connected with Harold Schwartz on 03/23/19 at  8:15 AM EDT by a video enabled telemedicine application and verified that I am speaking with the correct person using two identifiers.   I discussed the limitations of evaluation and management by telemedicine and the availability of in person appointments. The patient expressed understanding and agreed to proceed.  Harold Schwartz was unable to connect to [facility] on his end. This conversation was continued via Telemedicine.  History of Present Illness:  Pt due for follow up of chronic conditions.  Aflutter: s/p ablation. He does not follow with cardiology at this time.  Chronic DVT: Left leg. On Eliquis. He has not noticed any s/s of bleeding. He has not had a recent CBC.  Hypogonadism: His last testosterone level was < 200, 03/2019. He is taking his Testosterone supplement as prescribed. He follows with [facility].   HLD: His last LDL was 102, triglycerides 63, 10/2017. He is taking Ezetimibe and Fish Oil as prescribed. He tries to consume a low fat diet.  BPH: He reports urinary frequency and leakage. He takes Uroxatral as prescribed. PSA from 10/2017 reviewed, but reports this is being checked by urology.   Hx of Genital Herpes:  He takes Valtrex daily with good results.   Observations/Objective:  Alert and oriented NAD Judgement and thought content are normal  Assessment and Plan:  See problem based chartinig  Follow Up Instructions:    I discussed the assessment and treatment plan with the patient. The patient was provided an opportunity to ask questions and all were answered. The patient agreed with the plan and demonstrated an understanding of the instructions.   The patient was advised to call back or seek an in-person evaluation if the symptoms worsen or if the condition fails to improve as anticipated.     Webb Silversmith, NP

## 2019-03-23 NOTE — Assessment & Plan Note (Signed)
In remission s/p ablation Will monitor

## 2019-03-23 NOTE — Assessment & Plan Note (Signed)
No improvement with Testosterone He will discuss this with urology

## 2019-03-23 NOTE — Patient Instructions (Signed)
Fat and Cholesterol Restricted Eating Plan Getting too much fat and cholesterol in your diet may cause health problems. Choosing the right foods helps keep your fat and cholesterol at normal levels. This can keep you from getting certain diseases. Your doctor may recommend an eating plan that includes:  Total fat: ______% or less of total calories a day.  Saturated fat: ______% or less of total calories a day.  Cholesterol: less than _________mg a day.  Fiber: ______g a day. What are tips for following this plan? Meal planning  At meals, divide your plate into four equal parts: ? Fill one-half of your plate with vegetables and green salads. ? Fill one-fourth of your plate with whole grains. ? Fill one-fourth of your plate with low-fat (lean) protein foods.  Eat fish that is high in omega-3 fats at least two times a week. This includes mackerel, tuna, sardines, and salmon.  Eat foods that are high in fiber, such as whole grains, beans, apples, broccoli, carrots, peas, and barley. General tips   Work with your doctor to lose weight if you need to.  Avoid: ? Foods with added sugar. ? Fried foods. ? Foods with partially hydrogenated oils.  Limit alcohol intake to no more than 1 drink a day for nonpregnant women and 2 drinks a day for men. One drink equals 12 oz of beer, 5 oz of wine, or 1 oz of hard liquor. Reading food labels  Check food labels for: ? Trans fats. ? Partially hydrogenated oils. ? Saturated fat (g) in each serving. ? Cholesterol (mg) in each serving. ? Fiber (g) in each serving.  Choose foods with healthy fats, such as: ? Monounsaturated fats. ? Polyunsaturated fats. ? Omega-3 fats.  Choose grain products that have whole grains. Look for the word "whole" as the first word in the ingredient list. Cooking  Cook foods using low-fat methods. These include baking, boiling, grilling, and broiling.  Eat more home-cooked foods. Eat at restaurants and buffets  less often.  Avoid cooking using saturated fats, such as butter, cream, palm oil, palm kernel oil, and coconut oil. Recommended foods  Fruits  All fresh, canned (in natural juice), or frozen fruits. Vegetables  Fresh or frozen vegetables (raw, steamed, roasted, or grilled). Green salads. Grains  Whole grains, such as whole wheat or whole grain breads, crackers, cereals, and pasta. Unsweetened oatmeal, bulgur, barley, quinoa, or brown rice. Corn or whole wheat flour tortillas. Meats and other protein foods  Ground beef (85% or leaner), grass-fed beef, or beef trimmed of fat. Skinless chicken or turkey. Ground chicken or turkey. Pork trimmed of fat. All fish and seafood. Egg whites. Dried beans, peas, or lentils. Unsalted nuts or seeds. Unsalted canned beans. Nut butters without added sugar or oil. Dairy  Low-fat or nonfat dairy products, such as skim or 1% milk, 2% or reduced-fat cheeses, low-fat and fat-free ricotta or cottage cheese, or plain low-fat and nonfat yogurt. Fats and oils  Tub margarine without trans fats. Light or reduced-fat mayonnaise and salad dressings. Avocado. Olive, canola, sesame, or safflower oils. The items listed above may not be a complete list of foods and beverages you can eat. Contact a dietitian for more information. Foods to avoid Fruits  Canned fruit in heavy syrup. Fruit in cream or butter sauce. Fried fruit. Vegetables  Vegetables cooked in cheese, cream, or butter sauce. Fried vegetables. Grains  White bread. White pasta. White rice. Cornbread. Bagels, pastries, and croissants. Crackers and snack foods that contain trans fat   and hydrogenated oils. Meats and other protein foods  Fatty cuts of meat. Ribs, chicken wings, bacon, sausage, bologna, salami, chitterlings, fatback, hot dogs, bratwurst, and packaged lunch meats. Liver and organ meats. Whole eggs and egg yolks. Chicken and turkey with skin. Fried meat. Dairy  Whole or 2% milk, cream,  half-and-half, and cream cheese. Whole milk cheeses. Whole-fat or sweetened yogurt. Full-fat cheeses. Nondairy creamers and whipped toppings. Processed cheese, cheese spreads, and cheese curds. Beverages  Alcohol. Sugar-sweetened drinks such as sodas, lemonade, and fruit drinks. Fats and oils  Butter, stick margarine, lard, shortening, ghee, or bacon fat. Coconut, palm kernel, and palm oils. Sweets and desserts  Corn syrup, sugars, honey, and molasses. Candy. Jam and jelly. Syrup. Sweetened cereals. Cookies, pies, cakes, donuts, muffins, and ice cream. The items listed above may not be a complete list of foods and beverages you should avoid. Contact a dietitian for more information. Summary  Choosing the right foods helps keep your fat and cholesterol at normal levels. This can keep you from getting certain diseases.  At meals, fill one-half of your plate with vegetables and green salads.  Eat high-fiber foods, like whole grains, beans, apples, carrots, peas, and barley.  Limit added sugar, saturated fats, alcohol, and fried foods. This information is not intended to replace advice given to you by your health care provider. Make sure you discuss any questions you have with your health care provider. Document Released: 06/03/2012 Document Revised: 08/06/2018 Document Reviewed: 08/20/2017 Elsevier Interactive Patient Education  2019 Elsevier Inc.   

## 2019-03-23 NOTE — Assessment & Plan Note (Signed)
Continue Valtrex suppression Will monitor

## 2019-03-23 NOTE — Assessment & Plan Note (Signed)
Encouraged him to consume a low fat diet  Continue Ezetimibe for now Will obtain CMET and Lipid profile at annual exam

## 2019-03-23 NOTE — Assessment & Plan Note (Signed)
Continue Uroxatral Continue to follow with urology

## 2019-03-23 NOTE — Assessment & Plan Note (Signed)
Continue Eliquis Will obtain CBC at annual exam

## 2019-03-24 ENCOUNTER — Telehealth: Payer: Self-pay

## 2019-03-24 ENCOUNTER — Encounter: Payer: Self-pay | Admitting: Internal Medicine

## 2019-03-24 ENCOUNTER — Other Ambulatory Visit: Payer: Self-pay | Admitting: Internal Medicine

## 2019-03-24 NOTE — Telephone Encounter (Signed)
PA has been submitted via [email or web address] awaiting response 

## 2019-04-20 ENCOUNTER — Encounter: Payer: Self-pay | Admitting: Internal Medicine

## 2019-04-22 NOTE — Telephone Encounter (Signed)
Harold Schwartz with [facility] left v/m; Harold Schwartz received 1 testosterone level and she needs multiple testosterone levels and time drawn to get a PA done for pt.Harold Schwartz request cb.

## 2019-06-29 ENCOUNTER — Telehealth: Payer: BC Managed Care – PPO | Admitting: Physician Assistant

## 2019-06-29 ENCOUNTER — Encounter: Payer: Self-pay | Admitting: Physician Assistant

## 2019-06-29 DIAGNOSIS — L739 Follicular disorder, unspecified: Secondary | ICD-10-CM | POA: Diagnosis not present

## 2019-06-29 MED ORDER — MUPIROCIN CALCIUM 2 % EX CREA: 1.0000 "application " | TOPICAL_CREAM | Freq: Two times a day (BID) | CUTANEOUS | 0 refills | Status: DC

## 2019-06-29 NOTE — Progress Notes (Signed)
E Visit for Rash  We are sorry that you are not feeling well. Here is how we plan to help!   Based upon what you have shared with me it looks like you have a bacterial follicultits.  Folliculitis is inflammation of the hair follicles that can be caused by a superficial infection of the skin and is treated with an antibiotic. I have prescribed: and Topical mupiricin. Apply the cream twice daily.     HOME CARE:   Take cool showers and avoid direct sunlight.  Apply cool compress or wet dressings.  Take a bath in an oatmeal bath.  Sprinkle content of one Aveeno packet under running faucet with comfortably warm water.  Bathe for 15-20 minutes, 1-2 times daily.  Pat dry with a towel. Do not rub the rash.  Use hydrocortisone cream.  Take an antihistamine like Benadryl for widespread rashes that itch.  The adult dose of Benadryl is 25-50 mg by mouth 4 times daily.  Caution:  This type of medication may cause sleepiness.  Do not drink alcohol, drive, or operate dangerous machinery while taking antihistamines.  Do not take these medications if you have prostate enlargement.  Read package instructions thoroughly on all medications that you take.  GET HELP RIGHT AWAY IF:   Symptoms don't go away after treatment.  Severe itching that persists.  If you rash spreads or swells.  If you rash begins to smell.  If it blisters and opens or develops a yellow-brown crust.  You develop a fever.  You have a sore throat.  You become short of breath.  MAKE SURE YOU:  Understand these instructions. Will watch your condition. Will get help right away if you are not doing well or get worse.  Thank you for choosing an e-visit. Your e-visit answers were reviewed by a board certified advanced clinical practitioner to complete your personal care plan. Depending upon the condition, your plan could have included both over the counter or prescription medications. Please review your pharmacy choice. Be  sure that the pharmacy you have chosen is open so that you can pick up your prescription now.  If there is a problem you may message your provider in [place] to have the prescription routed to another pharmacy. Your safety is important to [place]. If you have drug allergies check your prescription carefully.  For the next 24 hours, you can use MyChart to ask questions about today's visit, request a non-urgent call back, or ask for a work or school excuse from your e-visit provider. You will get an email in the next two days asking about your experience. I hope that your e-visit has been valuable and will speed your recovery.     I spent 5-10 minutes on review and completion of this note- Lacy Duverney [facility]

## 2019-07-13 ENCOUNTER — Encounter: Payer: Self-pay | Admitting: Internal Medicine

## 2019-07-14 ENCOUNTER — Telehealth: Payer: BC Managed Care – PPO | Admitting: Family

## 2019-07-14 DIAGNOSIS — L309 Dermatitis, unspecified: Secondary | ICD-10-CM

## 2019-07-14 NOTE — Progress Notes (Signed)
Based on what you shared with me, I feel your condition warrants further evaluation and I recommend that you be seen for a face to face office visit.  We need to evaluate the rash face to face so that we can be sure your rash is not more complex  NOTE: If you entered your credit card information for this eVisit, you will not be charged. You may see a "hold" on your card for the $35 but that hold will drop off and you will not have a charge processed.  If you are having a true medical emergency please call 911.     For an urgent face to face visit, [facility] has five urgent care centers for your convenience:    [email or web address] to reserve your spot online an avoid wait times  [facility] (New Address!) [address] [place], [place] [address] *Just off [address], across the road from [place] hours of operation: Monday-Friday, 12 PM to 6 PM  Closed Saturday & Sunday   The following sites will take your insurance:  . [facility]    [phone]                  Get Driving Directions  [address], [place] [address] . 10 am to 8 pm Monday-Friday . 12 pm to 8 pm Saturday-Sunday   . [facility] at [place]                  Get Driving Directions  [address], [place], [place] [address] . 8 am to 8 pm Monday-Friday . 9 am to 6 pm Saturday . 11 am to 6 pm Sunday   . [facility] at [place]                  Get Driving Directions   [address], [place] [address] . 8 am to 8 pm Monday-Friday . 8 am to 4 pm Saturday-Sunday    . [facility] at [place]                    Get Driving Directions  [phone]  [address] [place], [place] [address]  . Monday-Friday, 12 PM to 6 PM    Your e-visit answers were reviewed by a board certified  advanced clinical practitioner to complete your personal care plan.  Thank you for using e-Visits.

## 2019-07-15 ENCOUNTER — Other Ambulatory Visit: Payer: Self-pay

## 2019-07-15 ENCOUNTER — Ambulatory Visit: Payer: BC Managed Care – PPO | Admitting: Internal Medicine

## 2019-07-15 ENCOUNTER — Encounter: Payer: Self-pay | Admitting: Internal Medicine

## 2019-07-15 VITALS — BP 128/84 | HR 72 | Temp 98.4°F | Wt 221.0 lb

## 2019-07-15 DIAGNOSIS — L739 Follicular disorder, unspecified: Secondary | ICD-10-CM | POA: Diagnosis not present

## 2019-07-15 MED ORDER — MUPIROCIN 2 % EX OINT: 1.0000 "application " | TOPICAL_OINTMENT | Freq: Two times a day (BID) | CUTANEOUS | 2 refills | Status: DC

## 2019-07-15 NOTE — Patient Instructions (Signed)

## 2019-07-15 NOTE — Progress Notes (Signed)
Subjective:    Patient ID: Harold Schwartz, male    DOB: 12/23/1955, 63 y.o.   MRN: [id number]  HPI  Pt presents to the clinic today with c/o rash on chest. He did an e- vist 7/13, diagnosed with folliculitis. He was prescribed Bactroban which provided minimal relief. He reports the Bactroban did improve the rash but not resolve it. He tried to get a refill of Bactroban via e-visit yesterday, but was advised he needed a face to face visit. He reports he has not had any changes in the rash, he just needs a refill of the cream.   Review of Systems      Past Medical History:  Diagnosis Date  . Atrial flutter ([place])   . Colon polyps   . Depression   . DVT (deep vein thrombosis) in pregnancy    last one in March 2019 taking eliquis  . Genital herpes   . Heart murmur   . Hemorrhoids   . Hyperlipidemia   . Hypogonadism in male   . Neuromuscular disorder ([place])    slight numbnes in right foot    Current Outpatient Medications  Medication Sig Dispense Refill  . alfuzosin (UROXATRAL) 10 MG 24 hr tablet TAKE 1 TABLET BY MOUTH EVERYDAY AT BEDTIME  3  . Celery Seed OIL by Does not apply route.    . cyclobenzaprine (FLEXERIL) 10 MG tablet Take 1 tablet (10 mg total) by mouth 3 times/day as needed-between meals & bedtime for muscle spasms. 30 tablet 0  . ELIQUIS 5 MG TABS tablet TAKE 1 TABLET BY MOUTH TWICE A DAY 180 tablet 1  . ezetimibe (ZETIA) 10 MG tablet Take 1 tablet (10 mg total) by mouth daily. 30 tablet 3  . glucosamine-chondroitin 500-400 MG tablet Take 1 tablet by mouth 2 (two) times daily.    . Magnesium 300 MG CAPS Take 1 capsule by mouth daily.    . mupirocin cream (BACTROBAN) 2 % Apply 1 application topically 2 (two) times daily. 15 g 0  . Omega-3 Fatty Acids (FISH OIL) 1200 MG CAPS Take 2,400-3,600 capsules by mouth daily. 2400 mg in the morning and 3600 mg in the evening    . Testosterone 30 MG/ACT SOLN APPLY/PLACE 1 PUMP UNDER EACH ARMPIT ONCE DAILY 90 mL 5  . valACYclovir  (VALTREX) 1000 MG tablet Take 1 tablet (1,000 mg total) by mouth daily. MUST SCHEDULE PHYSICAL 30 tablet 0   No current facility-administered medications for this visit.     No Known Allergies  Family History  Problem Relation Age of Onset  . Breast cancer Mother   . Heart disease Mother   . Hyperlipidemia Mother   . Hypertension Mother   . Colon cancer Father   . Heart disease Father   . Alzheimer's disease Father   . Hyperlipidemia Father   . Hyperlipidemia Brother     Social History   Socioeconomic History  . Marital status: Married    Spouse name: Not on file  . Number of children: 2  . Years of education: Not on file  . Highest education level: Not on file  Occupational History  . Occupation: Engineer, site  Social Needs  . Financial resource strain: Not on file  . Food insecurity    Worry: Not on file    Inability: Not on file  . Transportation needs    Medical: Not on file    Non-medical: Not on file  Tobacco Use  . Smoking status: Never Smoker  .  Smokeless tobacco: Never Used  Substance and Sexual Activity  . Alcohol use: Yes    Alcohol/week: 7.0 standard drinks    Types: 7 Shots of liquor per week    Comment: moderate  . Drug use: No  . Sexual activity: Not on file  Lifestyle  . Physical activity    Days per week: Not on file    Minutes per session: Not on file  . Stress: Not on file  Relationships  . Social Herbalist on phone: Not on file    Gets together: Not on file    Attends religious service: Not on file    Active member of club or organization: Not on file    Attends meetings of clubs or organizations: Not on file    Relationship status: Not on file  . Intimate partner violence    Fear of current or ex partner: Not on file    Emotionally abused: Not on file    Physically abused: Not on file    Forced sexual activity: Not on file  Other Topics Concern  . Not on file  Social History Narrative  . Not on file      Constitutional: Denies fever, malaise, fatigue, headache or abrupt weight changes.  Respiratory: Denies difficulty breathing, shortness of breath, cough or sputum production.   Cardiovascular: Denies chest pain, chest tightness, palpitations or swelling in the hands or feet.  Skin: Pt reports rash of chest. Denies ulcercations.    No other specific complaints in a complete review of systems (except as listed in HPI above).  Objective:   Physical Exam   BP 128/84   Pulse 72   Temp 98.4 F (36.9 C) (Temporal)   Wt 221 lb (100.2 kg)   SpO2 98%   BMI 30.82 kg/m  Wt Readings from Last 3 Encounters:  07/15/19 221 lb (100.2 kg)  12/24/18 222 lb (100.7 kg)  08/11/18 220 lb (99.8 kg)    General: Appears his stated age, well developed, well nourished in NAD. Skin: Scattered papular red lesion noted on chest wall, adjacent to hair follicles. Cardiovascular: Normal rate and rhythm.  Pulmonary/Chest: Normal effort and positive vesicular breath sounds. No respiratory distress. No wheezes, rales or ronchi noted.  Neurological: Alert and oriented.   BMET    Component Value Date/Time   NA 140 06/30/2018 1244   NA 139 03/20/2017 0815   K 4.0 06/30/2018 1244   CL 105 06/30/2018 1244   CO2 27 06/30/2018 1244   GLUCOSE 106 (H) 06/30/2018 1244   BUN 19 06/30/2018 1244   BUN 18 03/20/2017 0815   CREATININE 1.20 06/30/2018 1244   CALCIUM 9.0 06/30/2018 1244   GFRNONAA >60 02/25/2018 1852   GFRAA >60 02/25/2018 1852    Lipid Panel     Component Value Date/Time   CHOL 175 10/21/2017 1252   TRIG 63.0 10/21/2017 1252   HDL 61.10 10/21/2017 1252   CHOLHDL 3 10/21/2017 1252   VLDL 12.6 10/21/2017 1252   LDLCALC 102 (H) 10/21/2017 1252    CBC    Component Value Date/Time   WBC 4.9 02/25/2018 1852   RBC 4.94 02/25/2018 1852   HGB 14.9 02/25/2018 1852   HGB 14.9 03/20/2017 0815   HCT 44.6 02/25/2018 1852   HCT 44.0 03/20/2017 0815   PLT 126 (L) 02/25/2018 1852   PLT 149 (L)  03/20/2017 0815   MCV 90.2 02/25/2018 1852   MCV 89 03/20/2017 0815   MCH 30.2 02/25/2018  1852   MCHC 33.5 02/25/2018 1852   RDW 13.7 02/25/2018 1852   RDW 14.3 03/20/2017 0815   LYMPHSABS 1.7 02/25/2018 1852   LYMPHSABS 1.5 03/20/2017 0815   MONOABS 0.4 02/25/2018 1852   EOSABS 0.2 02/25/2018 1852   EOSABS 0.1 03/20/2017 0815   BASOSABS 0.0 02/25/2018 1852   BASOSABS 0.0 03/20/2017 0815    Hgb A1C No results found for: HGBA1C        Assessment & Plan:   Folliculitis of Chest:  Rx for Bactroban BID prn  Advised his to use an exfoliating sponge like a loofah, washing in dishwasher at least once weakly He can also use body wash that contains salicyciic acid  Return precautions discussed Webb Silversmith, NP

## 2019-07-22 ENCOUNTER — Other Ambulatory Visit: Payer: Self-pay | Admitting: Internal Medicine

## 2019-08-11 ENCOUNTER — Other Ambulatory Visit: Payer: Self-pay

## 2019-08-11 ENCOUNTER — Ambulatory Visit (INDEPENDENT_AMBULATORY_CARE_PROVIDER_SITE_OTHER): Payer: BC Managed Care – PPO

## 2019-08-11 DIAGNOSIS — Z23 Encounter for immunization: Secondary | ICD-10-CM

## 2019-09-06 ENCOUNTER — Other Ambulatory Visit: Payer: Self-pay | Admitting: Internal Medicine

## 2019-09-14 ENCOUNTER — Other Ambulatory Visit: Payer: Self-pay

## 2019-09-14 ENCOUNTER — Encounter: Payer: Self-pay | Admitting: Internal Medicine

## 2019-09-14 ENCOUNTER — Ambulatory Visit (INDEPENDENT_AMBULATORY_CARE_PROVIDER_SITE_OTHER): Payer: BC Managed Care – PPO | Admitting: Internal Medicine

## 2019-09-14 ENCOUNTER — Telehealth: Payer: Self-pay

## 2019-09-14 VITALS — BP 128/82 | HR 78 | Temp 98.1°F | Ht 70.75 in | Wt 212.0 lb

## 2019-09-14 DIAGNOSIS — Z0000 Encounter for general adult medical examination without abnormal findings: Secondary | ICD-10-CM

## 2019-09-14 LAB — COMPREHENSIVE METABOLIC PANEL
ALT: 18 U/L (ref 0–53)
AST: 20 U/L (ref 0–37)
Albumin: 4 g/dL (ref 3.5–5.2)
Alkaline Phosphatase: 44 U/L (ref 39–117)
BUN: 15 mg/dL (ref 6–23)
CO2: 29 mEq/L (ref 19–32)
Calcium: 9 mg/dL (ref 8.4–10.5)
Chloride: 103 mEq/L (ref 96–112)
Creatinine, Ser: 1.11 mg/dL (ref 0.40–1.50)
GFR: 66.55 mL/min (ref >60.00)
Glucose, Bld: 108 mg/dL — ABNORMAL HIGH (ref 70–99)
Potassium: 3.8 mEq/L (ref 3.5–5.1)
Sodium: 139 mEq/L (ref 135–145)
Total Bilirubin: 0.9 mg/dL (ref 0.2–1.2)
Total Protein: 6.6 g/dL (ref 6.0–8.3)

## 2019-09-14 LAB — LIPID PANEL
Cholesterol: 145 mg/dL (ref 0–200)
HDL: 55.2 mg/dL (ref >39.00)
LDL Cholesterol: 69 mg/dL (ref 0–99)
NonHDL: 89.98
Total CHOL/HDL Ratio: 3
Triglycerides: 104 mg/dL (ref 0.0–149.0)
VLDL: 20.8 mg/dL (ref 0.0–40.0)

## 2019-09-14 LAB — CBC
HCT: 46.5 % (ref 39.0–52.0)
Hemoglobin: 15.5 g/dL (ref 13.0–17.0)
MCHC: 33.4 g/dL (ref 30.0–36.0)
MCV: 92.6 fl (ref 78.0–100.0)
Platelets: 132 10*3/uL — ABNORMAL LOW (ref 150.0–400.0)
RBC: 5.02 Mil/uL (ref 4.22–5.81)
RDW: 13.3 % (ref 11.5–15.5)
WBC: 5 10*3/uL (ref 4.0–10.5)

## 2019-09-14 MED ORDER — CYCLOBENZAPRINE HCL 10 MG PO TABS: 10.0000 mg | ORAL_TABLET | Freq: Two times a day (BID) | ORAL | 0 refills | Status: DC | PRN

## 2019-09-14 NOTE — Progress Notes (Signed)
Subjective:    Patient ID: Harold Schwartz, male    DOB: 09-Jun-1956, 63 y.o.   MRN: [id number]  HPI  Pt presents to the clinic today for his annual exam.  Flu: 07/2019 Tetanus: 04/2015 Zostovax: 04/2015 Shingrix: never PSA Screening: 10/2017 Colon Screening: 04/2018 Vision Screening: annually Dentist: biannially  Diet: Eats, fruits and veggies and occasionally fried foods. Wine at dinner. Water through the day and hot tea in the morning Exercise: Daily walking  Review of Systems  Past Medical History:  Diagnosis Date  . Atrial flutter ([place])   . Colon polyps   . Depression   . DVT (deep vein thrombosis) in pregnancy    last one in March 2019 taking eliquis  . Genital herpes   . Heart murmur   . Hemorrhoids   . Hyperlipidemia   . Hypogonadism in male   . Neuromuscular disorder ([place])    slight numbnes in right foot    Current Outpatient Medications  Medication Sig Dispense Refill  . alfuzosin (UROXATRAL) 10 MG 24 hr tablet TAKE 1 TABLET BY MOUTH EVERYDAY AT BEDTIME  3  . Celery Seed OIL by Does not apply route.    . cyclobenzaprine (FLEXERIL) 10 MG tablet Take 1 tablet (10 mg total) by mouth 3 times/day as needed-between meals & bedtime for muscle spasms. 30 tablet 0  . ELIQUIS 5 MG TABS tablet TAKE 1 TABLET BY MOUTH TWICE A DAY 180 tablet 1  . ezetimibe (ZETIA) 10 MG tablet Take 1 tablet (10 mg total) by mouth daily. 30 tablet 0  . glucosamine-chondroitin 500-400 MG tablet Take 1 tablet by mouth 2 (two) times daily.    . Magnesium 300 MG CAPS Take 1 capsule by mouth daily.    . mupirocin ointment (BACTROBAN) 2 % Place 1 application into the nose 2 (two) times daily. 60 g 2  . Omega-3 Fatty Acids (FISH OIL) 1200 MG CAPS Take 2,400-3,600 capsules by mouth daily. 2400 mg in the morning and 3600 mg in the evening    . Testosterone 30 MG/ACT SOLN APPLY/PLACE 1 PUMP UNDER EACH ARMPIT ONCE DAILY 90 mL 5  . valACYclovir (VALTREX) 1000 MG tablet Take 1 tablet (1,000 mg total) by  mouth daily. MUST SCHEDULE PHYSICAL 30 tablet 0   No current facility-administered medications for this visit.     No Known Allergies  Family History  Problem Relation Age of Onset  . Breast cancer Mother   . Heart disease Mother   . Hyperlipidemia Mother   . Hypertension Mother   . Colon cancer Father   . Heart disease Father   . Alzheimer's disease Father   . Hyperlipidemia Father   . Hyperlipidemia Brother     Social History   Socioeconomic History  . Marital status: Married    Spouse name: Not on file  . Number of children: 2  . Years of education: Not on file  . Highest education level: Not on file  Occupational History  . Occupation: Engineer, site  Social Needs  . Financial resource strain: Not on file  . Food insecurity    Worry: Not on file    Inability: Not on file  . Transportation needs    Medical: Not on file    Non-medical: Not on file  Tobacco Use  . Smoking status: Never Smoker  . Smokeless tobacco: Never Used  Substance and Sexual Activity  . Alcohol use: Yes    Alcohol/week: 7.0 standard drinks    Types: 7 Shots  of liquor per week    Comment: moderate  . Drug use: No  . Sexual activity: Not on file  Lifestyle  . Physical activity    Days per week: Not on file    Minutes per session: Not on file  . Stress: Not on file  Relationships  . Social Herbalist on phone: Not on file    Gets together: Not on file    Attends religious service: Not on file    Active member of club or organization: Not on file    Attends meetings of clubs or organizations: Not on file    Relationship status: Not on file  . Intimate partner violence    Fear of current or ex partner: Not on file    Emotionally abused: Not on file    Physically abused: Not on file    Forced sexual activity: Not on file  Other Topics Concern  . Not on file  Social History Narrative  . Not on file     Constitutional: Denies fever, malaise, fatigue, headache or  abrupt weight changes.  HEENT: Denies eye pain, eye redness, ear pain, ringing in the ears, wax buildup, runny nose, nasal congestion, bloody nose, or sore throat. Respiratory: Denies difficulty breathing, shortness of breath, cough or sputum production.   Cardiovascular: Denies chest pain, chest tightness, palpitations or swelling in the hands or feet.  Gastrointestinal: Denies abdominal pain, bloating, constipation, diarrhea or blood in the stool.  GU: Denies urgency, frequency, pain with urination, burning sensation, blood in urine, odor or discharge. Musculoskeletal: Denies decrease in range of motion, difficulty with gait, muscle pain or joint pain and swelling.  Skin: Denies redness, rashes, lesions or ulcercations.  Neurological: Pt reports numbness in right great toe, intermittent muscle cramping. Denies dizziness, difficulty with memory, difficulty with speech or problems with balance and coordination.  Psych: Denies anxiety, depression, SI/HI.  No other specific complaints in a complete review of systems (except as listed in HPI above).     Objective:   Physical Exam  Temp 98.1 F (36.7 C) (Temporal)     Wt Readings from Last 3 Encounters:  07/15/19 221 lb (100.2 kg)  12/24/18 222 lb (100.7 kg)  08/11/18 220 lb (99.8 kg)    General: Appears his stated age, well developed, well nourished in NAD. Skin: Warm, dry and intact. No rashes noted. HEENT: Head: normal shape and size; Eyes: sclera white, no icterus, conjunctiva pink, PERRLA and EOMs intact; Ears: Tm's gray and intact, normal light reflex;  Neck:  Neck supple, trachea midline. No masses, lumps or thyromegaly present.  Cardiovascular: Normal rate and rhythm. S1,S2 noted.  No murmur, rubs or gallops noted. No JVD or BLE edema. No carotid bruits noted. Pulmonary/Chest: Normal effort and positive vesicular breath sounds. No respiratory distress. No wheezes, rales or ronchi noted.  Abdomen: Soft and nontender. Normal bowel  sounds. No distention or masses noted. Liver, spleen and kidneys non palpable. Musculoskeletal: Strenght 5/5 BUE/BLE. No difficulty with gait.  Neurological: Alert and oriented. Cranial nerves II-XII grossly intact. Coordination normal.  Psychiatric: Mood and affect normal. Behavior is normal. Judgment and thought content normal.    BMET    Component Value Date/Time   NA 140 06/30/2018 1244   NA 139 03/20/2017 0815   K 4.0 06/30/2018 1244   CL 105 06/30/2018 1244   CO2 27 06/30/2018 1244   GLUCOSE 106 (H) 06/30/2018 1244   BUN 19 06/30/2018 1244   BUN 18  03/20/2017 0815   CREATININE 1.20 06/30/2018 1244   CALCIUM 9.0 06/30/2018 1244   GFRNONAA >60 02/25/2018 1852   GFRAA >60 02/25/2018 1852    Lipid Panel     Component Value Date/Time   CHOL 175 10/21/2017 1252   TRIG 63.0 10/21/2017 1252   HDL 61.10 10/21/2017 1252   CHOLHDL 3 10/21/2017 1252   VLDL 12.6 10/21/2017 1252   LDLCALC 102 (H) 10/21/2017 1252    CBC    Component Value Date/Time   WBC 4.9 02/25/2018 1852   RBC 4.94 02/25/2018 1852   HGB 14.9 02/25/2018 1852   HGB 14.9 03/20/2017 0815   HCT 44.6 02/25/2018 1852   HCT 44.0 03/20/2017 0815   PLT 126 (L) 02/25/2018 1852   PLT 149 (L) 03/20/2017 0815   MCV 90.2 02/25/2018 1852   MCV 89 03/20/2017 0815   MCH 30.2 02/25/2018 1852   MCHC 33.5 02/25/2018 1852   RDW 13.7 02/25/2018 1852   RDW 14.3 03/20/2017 0815   LYMPHSABS 1.7 02/25/2018 1852   LYMPHSABS 1.5 03/20/2017 0815   MONOABS 0.4 02/25/2018 1852   EOSABS 0.2 02/25/2018 1852   EOSABS 0.1 03/20/2017 0815   BASOSABS 0.0 02/25/2018 1852   BASOSABS 0.0 03/20/2017 0815    Hgb A1C No results found for: HGBA1C         Assessment & Plan:   Preventative Health Maintenance:  Flu shot UTD Tetanus UTD Zostovax UTD He declines shingrix today but will check with his insurance company to see if this is covered. Colon screening UTD Encouraged him to consume a balanced diet and exercise regimen  Advised him to see an eye doctor and dentist annually Will check CBC, CMET, Lipid, PSA today  RTC in 1 year, sooner if needed Webb Silversmith, NP

## 2019-09-14 NOTE — Patient Instructions (Signed)

## 2019-09-15 ENCOUNTER — Other Ambulatory Visit (INDEPENDENT_AMBULATORY_CARE_PROVIDER_SITE_OTHER): Payer: BC Managed Care – PPO

## 2019-09-15 DIAGNOSIS — R7309 Other abnormal glucose: Secondary | ICD-10-CM | POA: Diagnosis not present

## 2019-09-15 LAB — HEMOGLOBIN A1C: Hgb A1c MFr Bld: 5.5 % (ref 4.6–6.5)

## 2019-09-18 NOTE — Telephone Encounter (Signed)
Faxed req most recent labs

## 2019-09-20 ENCOUNTER — Other Ambulatory Visit: Payer: Self-pay | Admitting: Internal Medicine

## 2019-09-30 ENCOUNTER — Encounter: Payer: Self-pay | Admitting: Internal Medicine

## 2019-10-01 MED ORDER — EZETIMIBE 10 MG PO TABS: 10.0000 mg | ORAL_TABLET | Freq: Every day | ORAL | 2 refills | Status: DC

## 2020-01-13 ENCOUNTER — Ambulatory Visit: Payer: BC Managed Care – PPO

## 2020-01-17 ENCOUNTER — Encounter: Payer: Self-pay | Admitting: Internal Medicine

## 2020-01-22 ENCOUNTER — Ambulatory Visit: Payer: BC Managed Care – PPO

## 2020-01-24 ENCOUNTER — Other Ambulatory Visit: Payer: Self-pay | Admitting: Internal Medicine

## 2020-01-25 MED ORDER — VALACYCLOVIR HCL 1 G PO TABS: 1000.0000 mg | ORAL_TABLET | Freq: Every day | ORAL | 0 refills | Status: DC

## 2020-02-03 ENCOUNTER — Ambulatory Visit: Payer: BC Managed Care – PPO

## 2020-03-08 ENCOUNTER — Encounter: Payer: Self-pay | Admitting: Internal Medicine

## 2020-04-01 ENCOUNTER — Other Ambulatory Visit: Payer: Self-pay | Admitting: Internal Medicine

## 2020-04-11 ENCOUNTER — Other Ambulatory Visit: Payer: Self-pay | Admitting: Internal Medicine

## 2020-04-13 ENCOUNTER — Encounter: Payer: Self-pay | Admitting: Internal Medicine

## 2020-04-15 ENCOUNTER — Ambulatory Visit (INDEPENDENT_AMBULATORY_CARE_PROVIDER_SITE_OTHER): Payer: Medicare PPO | Admitting: Internal Medicine

## 2020-04-15 ENCOUNTER — Ambulatory Visit (INDEPENDENT_AMBULATORY_CARE_PROVIDER_SITE_OTHER)
Admission: RE | Admit: 2020-04-15 | Discharge: 2020-04-15 | Disposition: A | Discharge status: Home / Self Care | Payer: Medicare PPO | Source: Ambulatory Visit | Attending: Internal Medicine | Admitting: Internal Medicine

## 2020-04-15 ENCOUNTER — Ambulatory Visit: Payer: Medicare PPO | Admitting: Internal Medicine

## 2020-04-15 ENCOUNTER — Other Ambulatory Visit: Payer: Self-pay

## 2020-04-15 ENCOUNTER — Encounter: Payer: Self-pay | Admitting: Internal Medicine

## 2020-04-15 VITALS — BP 124/80 | HR 72 | Temp 97.8°F | Wt 223.0 lb

## 2020-04-15 DIAGNOSIS — M5432 Sciatica, left side: Secondary | ICD-10-CM | POA: Diagnosis not present

## 2020-04-15 DIAGNOSIS — M25522 Pain in left elbow: Secondary | ICD-10-CM

## 2020-04-15 DIAGNOSIS — G8929 Other chronic pain: Secondary | ICD-10-CM

## 2020-04-15 DIAGNOSIS — M545 Low back pain: Secondary | ICD-10-CM | POA: Diagnosis not present

## 2020-04-15 DIAGNOSIS — M25552 Pain in left hip: Secondary | ICD-10-CM | POA: Diagnosis not present

## 2020-04-15 DIAGNOSIS — M5442 Lumbago with sciatica, left side: Secondary | ICD-10-CM

## 2020-04-15 MED ORDER — PREDNISONE 10 MG PO TABS: ORAL_TABLET | ORAL | 0 refills | Status: DC

## 2020-04-15 NOTE — Patient Instructions (Signed)
Sciatica  Sciatica is pain, weakness, tingling, or loss of feeling (numbness) along the sciatic nerve. The sciatic nerve starts in the lower back and goes down the back of each leg. Sciatica usually goes away on its own or with treatment. Sometimes, sciatica may come back (recur). What are the causes? This condition happens when the sciatic nerve is pinched or has pressure put on it. This may be the result of:  A disk in between the bones of the spine bulging out too far (herniated disk).  Changes in the spinal disks that occur with aging.  A condition that affects a muscle in the butt.  Extra bone growth near the sciatic nerve.  A break (fracture) of the area between your hip bones (pelvis).  Pregnancy.  Tumor. This is rare. What increases the risk? You are more likely to develop this condition if you:  Play sports that put pressure or stress on the spine.  Have poor strength and ease of movement (flexibility).  Have had a back injury in the past.  Have had back surgery.  Sit for long periods of time.  Do activities that involve bending or lifting over and over again.  Are very overweight (obese). What are the signs or symptoms? Symptoms can vary from mild to very bad. They may include:  Any of these problems in the lower back, leg, hip, or butt: ? Mild tingling, loss of feeling, or dull aches. ? Burning sensations. ? Sharp pains.  Loss of feeling in the back of the calf or the sole of the foot.  Leg weakness.  Very bad back pain that makes it hard to move. These symptoms may get worse when you cough, sneeze, or laugh. They may also get worse when you sit or stand for long periods of time. How is this treated? This condition often gets better without any treatment. However, treatment may include:  Changing or cutting back on physical activity when you have pain.  Doing exercises and stretching.  Putting ice or heat on the affected area.  Medicines that  help: ? To relieve pain and swelling. ? To relax your muscles.  Shots (injections) of medicines that help to relieve pain, irritation, and swelling.  Surgery. Follow these instructions at home: Medicines  Take over-the-counter and prescription medicines only as told by your doctor.  Ask your doctor if the medicine prescribed to you: ? Requires you to avoid driving or using heavy machinery. ? Can cause trouble pooping (constipation). You may need to take these steps to prevent or treat trouble pooping:  Drink enough fluids to keep your pee (urine) pale yellow.  Take over-the-counter or prescription medicines.  Eat foods that are high in fiber. These include beans, whole grains, and fresh fruits and vegetables.  Limit foods that are high in fat and sugar. These include fried or sweet foods. Managing pain      If told, put ice on the affected area. ? Put ice in a plastic bag. ? Place a towel between your skin and the bag. ? Leave the ice on for 20 minutes, 2-3 times a day.  If told, put heat on the affected area. Use the heat source that your doctor tells you to use, such as a moist heat pack or a heating pad. ? Place a towel between your skin and the heat source. ? Leave the heat on for 20-30 minutes. ? Remove the heat if your skin turns bright red. This is very important if you are   unable to feel pain, heat, or cold. You may have a greater risk of getting burned. Activity   Return to your normal activities as told by your doctor. Ask your doctor what activities are safe for you.  Avoid activities that make your symptoms worse.  Take short rests during the day. ? When you rest for a long time, do some physical activity or stretching between periods of rest. ? Avoid sitting for a long time without moving. Get up and move around at least one time each hour.  Exercise and stretch regularly, as told by your doctor.  Do not lift anything that is heavier than 10 lb (4.5 kg)  while you have symptoms of sciatica. ? Avoid lifting heavy things even when you do not have symptoms. ? Avoid lifting heavy things over and over.  When you lift objects, always lift in a way that is safe for your body. To do this, you should: ? Bend your knees. ? Keep the object close to your body. ? Avoid twisting. General instructions  Stay at a healthy weight.  Wear comfortable shoes that support your feet. Avoid wearing high heels.  Avoid sleeping on a mattress that is too soft or too hard. You might have less pain if you sleep on a mattress that is firm enough to support your back.  Keep all follow-up visits as told by your doctor. This is important. Contact a doctor if:  You have pain that: ? Wakes you up when you are sleeping. ? Gets worse when you lie down. ? Is worse than the pain you have had in the past. ? Lasts longer than 4 weeks.  You lose weight without trying. Get help right away if:  You cannot control when you pee (urinate) or poop (have a bowel movement).  You have weakness in any of these areas and it gets worse: ? Lower back. ? The area between your hip bones. ? Butt. ? Legs.  You have redness or swelling of your back.  You have a burning feeling when you pee. Summary  Sciatica is pain, weakness, tingling, or loss of feeling (numbness) along the sciatic nerve.  This condition happens when the sciatic nerve is pinched or has pressure put on it.  Sciatica can cause pain, tingling, or loss of feeling (numbness) in the lower back, legs, hips, and butt.  Treatment often includes rest, exercise, medicines, and putting ice or heat on the affected area. This information is not intended to replace advice given to you by your health care provider. Make sure you discuss any questions you have with your health care provider. Document Revised: 12/22/2018 Document Reviewed: 12/22/2018 [employer] Patient Education  2020 [employer]  

## 2020-04-15 NOTE — Progress Notes (Signed)
Subjective:    Patient ID: Harold Schwartz, male    DOB: Apr 22, 1956, 64 y.o.   MRN: [id number]  HPI  Pt presents to the clinic today with c/o left hip pain. This started 2 months ago. He describes the pain as achy. The pain is intermittent but radiates into his left knee. The pain is worse with certain movements. He denies numbness, tingling or weakness of the left lower extremity. He denies any injury to the area. He has tried stretching and TENS unit with some relief. He has not taken any meds OTC for this.  He also reports left elbow pain. This started 1 month ago. He describes the pain as achy, worse with movement. The pain does not radiate. He denies numbness, tingling but has had some weakness of the left upper extremity. He denies neck pain. He denies any injury to the area. He has not tried anything OTC for this.  He would like a referral for physical therapy.  Review of Systems      Past Medical History:  Diagnosis Date  . Atrial flutter ([place])   . Colon polyps   . Depression   . DVT (deep vein thrombosis) in pregnancy    last one in March 2019 taking eliquis  . Genital herpes   . Heart murmur   . Hemorrhoids   . Hyperlipidemia   . Hypogonadism in male   . Neuromuscular disorder ([place])    slight numbnes in right foot    Current Outpatient Medications  Medication Sig Dispense Refill  . alfuzosin (UROXATRAL) 10 MG 24 hr tablet TAKE 1 TABLET BY MOUTH EVERYDAY AT BEDTIME  3  . B-D 3CC LUER-LOK SYR 23GX1" 23G X 1" 3 ML MISC     . Celery Seed OIL by Does not apply route.    . cyclobenzaprine (FLEXERIL) 10 MG tablet Take 1 tablet (10 mg total) by mouth 3 times/day as needed-between meals & bedtime for muscle spasms. 30 tablet 0  . ELIQUIS 5 MG TABS tablet TAKE 1 TABLET BY MOUTH TWICE A DAY 180 tablet 0  . ezetimibe (ZETIA) 10 MG tablet Take 1 tablet (10 mg total) by mouth daily. 90 tablet 2  . glucosamine-chondroitin 500-400 MG tablet Take 1 tablet by mouth 2 (two) times  daily.    . Magnesium 300 MG CAPS Take 1 capsule by mouth daily.    . Omega-3 Fatty Acids (FISH OIL) 1200 MG CAPS Take 2,400-3,600 capsules by mouth daily. 2400 mg in the morning and 3600 mg in the evening    . testosterone cypionate (DEPOTESTOSTERONE CYPIONATE) 200 MG/ML injection Inject 100 mg into the muscle once a week.    . valACYclovir (VALTREX) 1000 MG tablet TAKE ONE TABLET BY MOUTH DAILY 30 tablet 0   No current facility-administered medications for this visit.    No Known Allergies  Family History  Problem Relation Age of Onset  . Breast cancer Mother   . Heart disease Mother   . Hyperlipidemia Mother   . Hypertension Mother   . Colon cancer Father   . Heart disease Father   . Alzheimer's disease Father   . Hyperlipidemia Father   . Hyperlipidemia Brother     Social History   Socioeconomic History  . Marital status: Married    Spouse name: Not on file  . Number of children: 2  . Years of education: Not on file  . Highest education level: Not on file  Occupational History  . Occupation: Engineer, site  Tobacco Use  . Smoking status: Never Smoker  . Smokeless tobacco: Never Used  Substance and Sexual Activity  . Alcohol use: Yes    Alcohol/week: 7.0 standard drinks    Types: 7 Shots of liquor per week    Comment: moderate  . Drug use: No  . Sexual activity: Not on file  Other Topics Concern  . Not on file  Social History Narrative  . Not on file   Social Determinants of Health   Financial Resource Strain:   . Difficulty of Paying Living Expenses:   Food Insecurity:   . Worried About Charity fundraiser in the Last Year:   . Arboriculturist in the Last Year:   Transportation Needs:   . Film/video editor (Medical):   Marland Kitchen Lack of Transportation (Non-Medical):   Physical Activity:   . Days of Exercise per Week:   . Minutes of Exercise per Session:   Stress:   . Feeling of Stress :   Social Connections:   . Frequency of Communication with  Friends and Family:   . Frequency of Social Gatherings with Friends and Family:   . Attends Religious Services:   . Active Member of Clubs or Organizations:   . Attends Archivist Meetings:   Marland Kitchen Marital Status:   Intimate Partner Violence:   . Fear of Current or Ex-Partner:   . Emotionally Abused:   Marland Kitchen Physically Abused:   . Sexually Abused:      Constitutional: Denies fever, malaise, fatigue, headache or abrupt weight changes.  Respiratory: Denies difficulty breathing, shortness of breath, cough or sputum production.   Cardiovascular: Denies chest pain, chest tightness, palpitations or swelling in the hands or feet.  Musculoskeletal: Pt reports left elbow, hip and knee pain. Denies decrease in range of motion, difficulty with gait, muscle pain or joint swelling.  Skin: Denies redness, rashes, lesions or ulcercations.    No other specific complaints in a complete review of systems (except as listed in HPI above).  Objective:   Physical Exam  BP 124/80   Pulse 72   Temp 97.8 F (36.6 C) (Temporal)   Wt 223 lb (101.2 kg)   SpO2 98%   BMI 31.32 kg/m   Wt Readings from Last 3 Encounters:  09/14/19 212 lb (96.2 kg)  07/15/19 221 lb (100.2 kg)  12/24/18 222 lb (100.7 kg)    General: Appears his stated age, well developed, well nourished in NAD. Skin: Warm, dry and intact. No rashes noted. Cardiovascular: Normal rate. Pulmonary/Chest: Normal effort and positive vesicular breath sounds. No respiratory distress. No wheezes, rales or ronchi noted.  Musculoskeletal: Pain with flexion of the left elbow. Normal extension and rotation. No joint swelling noted. Pain with palpitation adjacent to the lateral epicondyle. Normal flexion, extension and rotation of the spine. No bony tenderness noted over the spine. Normal abduction and external rotation of the left hip. Pain with abduction and internal rotation of the left hip. Pain with palpation over the trochanteric bursa.  Strength 5/5 BUE/BLE. Able to stand on heels and toes. No difficulty with gait.  Neurological: Alert and oriented.    BMET    Component Value Date/Time   NA 139 09/14/2019 1234   NA 139 03/20/2017 0815   K 3.8 09/14/2019 1234   CL 103 09/14/2019 1234   CO2 29 09/14/2019 1234   GLUCOSE 108 (H) 09/14/2019 1234   BUN 15 09/14/2019 1234   BUN 18 03/20/2017 0815   CREATININE  1.11 09/14/2019 1234   CALCIUM 9.0 09/14/2019 1234   GFRNONAA >60 02/25/2018 1852   GFRAA >60 02/25/2018 1852    Lipid Panel     Component Value Date/Time   CHOL 145 09/14/2019 1234   TRIG 104.0 09/14/2019 1234   HDL 55.20 09/14/2019 1234   CHOLHDL 3 09/14/2019 1234   VLDL 20.8 09/14/2019 1234   LDLCALC 69 09/14/2019 1234    CBC    Component Value Date/Time   WBC 5.0 09/14/2019 1234   RBC 5.02 09/14/2019 1234   HGB 15.5 09/14/2019 1234   HGB 14.9 03/20/2017 0815   HCT 46.5 09/14/2019 1234   HCT 44.0 03/20/2017 0815   PLT 132.0 (L) 09/14/2019 1234   PLT 149 (L) 03/20/2017 0815   MCV 92.6 09/14/2019 1234   MCV 89 03/20/2017 0815   MCH 30.2 02/25/2018 1852   MCHC 33.4 09/14/2019 1234   RDW 13.3 09/14/2019 1234   RDW 14.3 03/20/2017 0815   LYMPHSABS 1.7 02/25/2018 1852   LYMPHSABS 1.5 03/20/2017 0815   MONOABS 0.4 02/25/2018 1852   EOSABS 0.2 02/25/2018 1852   EOSABS 0.1 03/20/2017 0815   BASOSABS 0.0 02/25/2018 1852   BASOSABS 0.0 03/20/2017 0815    Hgb A1C Lab Results  Component Value Date   HGBA1C 5.5 09/15/2019           Assessment & Plan:   Left Elbow Pain:  Likely lateral epicondylitis Rx for Prednisone x 9 days Avoid NSAID's OTC Avoid overuse  Left Hip Pain:  Will obtain xray of lumbar and left ip ? Sciatica RX for Pred Taper x 9 days Referral to PT for further evaluation Encouraged stretching and ice  Will follow up after xrays, return precautions discussed   Webb Silversmith, NP This visit occurred during the SARS-CoV-2 public health emergency.  Safety  protocols were in place, including screening questions prior to the visit, additional usage of staff PPE, and extensive cleaning of exam room while observing appropriate contact time as indicated for disinfecting solutions.

## 2020-04-16 ENCOUNTER — Encounter: Payer: Self-pay | Admitting: Internal Medicine

## 2020-04-30 ENCOUNTER — Other Ambulatory Visit: Payer: Self-pay | Admitting: Urology

## 2020-05-06 ENCOUNTER — Other Ambulatory Visit: Payer: Self-pay | Admitting: Urology

## 2020-05-22 ENCOUNTER — Other Ambulatory Visit: Payer: Self-pay | Admitting: Internal Medicine

## 2020-07-16 ENCOUNTER — Other Ambulatory Visit: Payer: Self-pay | Admitting: Internal Medicine

## 2020-07-17 ENCOUNTER — Other Ambulatory Visit: Payer: Self-pay | Admitting: Internal Medicine

## 2020-07-23 ENCOUNTER — Emergency Department (HOSPITAL_COMMUNITY)
Admission: EM | Admit: 2020-07-23 | Discharge: 2020-07-23 | Disposition: A | Discharge status: Home / Self Care | Payer: Medicare PPO | Attending: Emergency Medicine | Admitting: Emergency Medicine

## 2020-07-23 ENCOUNTER — Encounter (HOSPITAL_COMMUNITY): Payer: Self-pay | Admitting: Emergency Medicine

## 2020-07-23 ENCOUNTER — Emergency Department (HOSPITAL_COMMUNITY): Payer: Medicare PPO

## 2020-07-23 ENCOUNTER — Other Ambulatory Visit: Payer: Self-pay

## 2020-07-23 DIAGNOSIS — R61 Generalized hyperhidrosis: Secondary | ICD-10-CM | POA: Diagnosis not present

## 2020-07-23 DIAGNOSIS — R0789 Other chest pain: Secondary | ICD-10-CM | POA: Insufficient documentation

## 2020-07-23 LAB — CBC
HCT: 48.5 % (ref 39.0–52.0)
Hemoglobin: 16.2 g/dL (ref 13.0–17.0)
MCH: 30.9 pg (ref 26.0–34.0)
MCHC: 33.4 g/dL (ref 30.0–36.0)
MCV: 92.6 fL (ref 80.0–100.0)
Platelets: 132 10*3/uL — ABNORMAL LOW (ref 150–400)
RBC: 5.24 MIL/uL (ref 4.22–5.81)
RDW: 13.2 % (ref 11.5–15.5)
WBC: 4.5 10*3/uL (ref 4.0–10.5)
nRBC: 0 % (ref 0.0–0.2)

## 2020-07-23 LAB — BASIC METABOLIC PANEL
Anion gap: 10 (ref 5–15)
BUN: 16 mg/dL (ref 8–23)
CO2: 23 mmol/L (ref 22–32)
Calcium: 9.4 mg/dL (ref 8.9–10.3)
Chloride: 103 mmol/L (ref 98–111)
Creatinine, Ser: 1.02 mg/dL (ref 0.61–1.24)
GFR calc Af Amer: >60 mL/min (ref >60)
GFR calc non Af Amer: >60 mL/min (ref >60)
Glucose, Bld: 98 mg/dL (ref 70–99)
Potassium: 4.3 mmol/L (ref 3.5–5.1)
Sodium: 136 mmol/L (ref 135–145)

## 2020-07-23 LAB — TROPONIN I (HIGH SENSITIVITY)
Troponin I (High Sensitivity): 7 ng/L (ref <18)
Troponin I (High Sensitivity): 7 ng/L (ref <18)

## 2020-07-23 MED ORDER — IOHEXOL 350 MG/ML SOLN
72.0000 mL | Freq: Once | INTRAVENOUS | Status: AC | PRN
  Administered 2020-07-23: 72 mL via INTRAVENOUS

## 2020-07-23 MED ORDER — SODIUM CHLORIDE 0.9% FLUSH: 3.0000 mL | Freq: Once | INTRAVENOUS | Status: DC

## 2020-07-23 NOTE — Discharge Instructions (Signed)
Your work-up today was reassuring with no signs of heart attack or blood clot in the lungs.  Your vital signs here have been reassuring.  Continue to take your medications as prescribed.  Can take extra strength Tylenol as needed for recurrent pain.  Call your primary care provider and set up follow-up appointment for reevaluation of your symptoms.  Return to the emergency department if any concerning signs or symptoms develop such as fevers, worsening chest pain or shortness of breath, loss of consciousness.

## 2020-07-23 NOTE — ED Notes (Signed)
Pt stable upon discharge; pt ambulatory with steady gait. Discussed pt discharge instructions and follow-up. Pt and spouse verbalized understanding.

## 2020-07-23 NOTE — ED Notes (Signed)
Ray MD at bedside 

## 2020-07-23 NOTE — ED Provider Notes (Signed)
[name] Provider Note   CSN: [id number] Arrival date & time: 07/23/20  1041     History Chief Complaint  Patient presents with  . Chest Pain    Harold Schwartz is a 64 y.o. male with history of paroxysmal atrial flutter, hyperlipidemia, DVT presents for evaluation of acute onset, resolved episode of right-sided chest pain. Reports that symptoms began at around 10 AM while walking around the [employer]. The pain was throbbing, radiated into the right upper extremity. It was associated with a feeling of "clamminess" and "just feeling off". He denies shortness of breath, nausea, vomiting, lightheadedness, or syncope. The pain was not pleuritic nor positional. No aggravating or alleviating factors noted. He states that the pain resolved at around 1 PM while in the waiting room. Denies recent travel or surgeries, hemoptysis, or leg swelling. He is currently on Eliquis for DVT that was identified 2 years ago. Reports compliance with this medication. He is a non-smoker, denies recreational drug use.  The history is provided by the patient.       Past Medical History:  Diagnosis Date  . Atrial flutter ([facility])   . Colon polyps   . Depression   . DVT (deep vein thrombosis) in pregnancy    last one in March 2019 taking eliquis  . Genital herpes   . Heart murmur   . Hemorrhoids   . Hyperlipidemia   . Hypogonadism in male   . Neuromuscular disorder ([facility])    slight numbnes in right foot    Patient Active Problem List   Diagnosis Date Noted  . BPH (benign prostatic hyperplasia) 03/23/2019  . Chronic deep vein thrombosis (DVT) of proximal vein of left lower extremity ([facility] Schwartz) 11/21/2016  . Genital herpes 04/25/2015  . Hypogonadism male 04/22/2014  . Hypertriglyceridemia 04/22/2014  . Paroxysmal atrial flutter ([facility] Schwartz) 04/22/2014    Past Surgical History:  Procedure Laterality Date  . A-FLUTTER ABLATION N/A 03/22/2017   Procedure: A-Flutter  Ablation;  Surgeon: Harold Sprang, MD;  Location: [facility];  Service: Cardiovascular;  Laterality: N/A;  . COLONOSCOPY WITH PROPOFOL N/A 05/16/2018   Procedure: COLONOSCOPY WITH PROPOFOL;  Surgeon: Harold Lame, MD;  Location: [facility];  Service: Endoscopy;  Laterality: N/A;  . KNEE ARTHROSCOPY    . POLYPECTOMY  05/16/2018   Procedure: POLYPECTOMY;  Surgeon: Harold Lame, MD;  Location: [facility];  Service: Endoscopy;;  . TENOTOMY ACHILLES TENDON Right 08/22/2018  . VASECTOMY  1990       Family History  Problem Relation Age of Onset  . Breast cancer Mother   . Heart disease Mother   . Hyperlipidemia Mother   . Hypertension Mother   . Colon cancer Father   . Heart disease Father   . Alzheimer's disease Father   . Hyperlipidemia Father   . Hyperlipidemia Brother     Social History   Tobacco Use  . Smoking status: Never Smoker  . Smokeless tobacco: Never Used  Substance Use Topics  . Alcohol use: Yes    Alcohol/week: 7.0 standard drinks    Types: 7 Shots of liquor per week    Comment: moderate  . Drug use: No    Home Medications Prior to Admission medications   Medication Sig Start Date End Date Taking? Authorizing Provider  alfuzosin (UROXATRAL) 10 MG 24 hr tablet Take 10 mg by mouth at bedtime.  07/03/18  Yes [provider]  Celery Seed OIL Take 1 capsule by mouth  in the morning and at bedtime.    Yes [provider]  ELIQUIS 5 MG TABS tablet TAKE 1 TABLET BY MOUTH TWICE A DAY Patient taking differently: Take 5 mg by mouth 2 (two) times daily.  07/17/20  Yes Baity, Coralie Keens, NP  ezetimibe (ZETIA) 10 MG tablet TAKE 1 TABLET BY MOUTH EVERY DAY Patient taking differently: Take 10 mg by mouth daily.  07/17/20  Yes Harold Fenton, NP  glucosamine-chondroitin 500-400 MG tablet Take 1 tablet by mouth 2 (two) times daily.   Yes [provider]  ibuprofen (ADVIL) 200 MG tablet Take 200-400 mg by mouth every 8 (eight) hours as  needed for headache or mild pain.   Yes [provider]  Magnesium 300 MG CAPS Take 300 mg by mouth at bedtime.    Yes [provider]  Omega-3 Fatty Acids (FISH OIL) 1200 MG CAPS Take 2,400-3,600 capsules by mouth daily. Take 2,400 mg by mouth in the morning and 3,600 mg in the evening   Yes [provider]  testosterone cypionate (DEPOTESTOSTERONE CYPIONATE) 200 MG/ML injection Inject 100 mg into the muscle every Friday.  07/29/19  Yes [provider]  B-D 3CC LUER-LOK SYR 23GX1" 23G X 1" 3 ML MISC  07/16/19   [provider]  BD HYPODERMIC NEEDLE 18G X 1" MISC USE AS DIRECTED BY PROVIDER Patient taking differently: as directed.  05/10/20   McKenzie, Candee Furbish, MD  cyclobenzaprine (FLEXERIL) 10 MG tablet Take 1 tablet (10 mg total) by mouth 3 times/day as needed-between meals & bedtime for muscle spasms. Patient not taking: Reported on 07/23/2020 09/14/19   Harold Fenton, NP  predniSONE (DELTASONE) 10 MG tablet Take 3 tabs on days 1-3, take 2 tabs on days 4-6, take 1 tab on days 7-9 Patient not taking: Reported on 07/23/2020 04/01/2020   Harold Fenton, NP  valACYclovir (VALTREX) 1000 MG tablet TAKE ONE TABLET BY MOUTH DAILY Patient not taking: Reported on 07/23/2020 07/17/20   Harold Fenton, NP    Allergies    Patient has no known allergies.  Review of Systems   Review of Systems  Constitutional: Positive for diaphoresis. Negative for chills and fever.  Respiratory: Positive for shortness of breath.   Cardiovascular: Negative for chest pain and leg swelling.  Gastrointestinal: Negative for abdominal pain, nausea and vomiting.  Neurological: Negative for syncope, weakness and light-headedness.  All other systems reviewed and are negative.   Physical Exam Updated Vital Signs BP (!) 151/97 (BP Location: Right Arm)   Pulse 70   Temp 98.4 F (36.9 C) (Oral)   Resp 16   Ht 5\' 11"  (1.803 m)   Wt 99.7 kg   SpO2 96%   BMI 30.66 kg/m   Physical  Exam Vitals and nursing note reviewed.  Constitutional:      General: He is not in acute distress.    Appearance: He is well-developed.  HENT:     Head: Normocephalic and atraumatic.  Eyes:     General:        Right eye: No discharge.        Left eye: No discharge.     Conjunctiva/sclera: Conjunctivae normal.  Neck:     Vascular: No JVD.     Trachea: No tracheal deviation.  Cardiovascular:     Rate and Rhythm: Normal rate and regular rhythm.     Pulses:          Radial pulses are 2+ on the right  side and 2+ on the left side.       Dorsalis pedis pulses are 2+ on the right side and 2+ on the left side.       Posterior tibial pulses are 2+ on the right side and 2+ on the left side.  Pulmonary:     Effort: Pulmonary effort is normal.     Breath sounds: Normal breath sounds.  Chest:     Chest wall: No tenderness.  Abdominal:     General: There is no distension.     Palpations: Abdomen is soft.     Tenderness: There is no abdominal tenderness. There is no guarding or rebound.  Musculoskeletal:     Right lower leg: No tenderness. No edema.     Left lower leg: No tenderness. No edema.  Skin:    General: Skin is warm.     Findings: No erythema.  Neurological:     Mental Status: He is alert.  Psychiatric:        Behavior: Behavior normal.     ED Results / Procedures / Treatments   Labs (all labs ordered are listed, but only abnormal results are displayed) Labs Reviewed  CBC - Abnormal; Notable for the following components:      Result Value   Platelets 132 (*)    All other components within normal limits  BASIC METABOLIC PANEL  TROPONIN I (HIGH SENSITIVITY)  TROPONIN I (HIGH SENSITIVITY)    EKG EKG Interpretation  Date/Time:  Saturday July 23 2020 11:10:31 EDT Ventricular Rate:  76 PR Interval:  162 QRS Duration: 80 QT Interval:  368 QTC Calculation: 414 R Axis:   80 Text Interpretation: Normal sinus rhythm Normal ECG Confirmed by Pattricia Boss [id number] on  07/23/2020 2:12:49 PM   Radiology DG Chest 2 View  Result Date: 07/23/2020 CLINICAL DATA:  Chest pain EXAM: CHEST - 2 VIEW COMPARISON:  12/04/2015 FINDINGS: The heart size and mediastinal contours are within normal limits. Both lungs are clear. The visualized skeletal structures are unremarkable. IMPRESSION: No acute abnormality of the lungs. Electronically Signed   By: Eddie Candle M.D.   On: 07/23/2020 12:28   CT Angio Chest PE W and/or Wo Contrast  Result Date: 07/23/2020 CLINICAL DATA:  Right side chest pain EXAM: CT ANGIOGRAPHY CHEST WITH CONTRAST TECHNIQUE: Multidetector CT imaging of the chest was performed using the standard protocol during bolus administration of intravenous contrast. Multiplanar CT image reconstructions and MIPs were obtained to evaluate the vascular anatomy. CONTRAST:  20mL OMNIPAQUE IOHEXOL 350 MG/ML SOLN COMPARISON:  None. FINDINGS: Cardiovascular: No filling defects in the pulmonary arteries to suggest pulmonary emboli. Heart is normal size. Aorta is normal caliber. Mediastinum/Nodes: No mediastinal, hilar, or axillary adenopathy. Trachea and esophagus are unremarkable. Thyroid unremarkable. Lungs/Pleura: Lungs are clear. No focal airspace opacities or suspicious nodules. No effusions. Upper Abdomen: Imaging into the upper abdomen demonstrates no acute findings. Musculoskeletal: Chest wall soft tissues are unremarkable. No acute bony abnormality. Review of the MIP images confirms the above findings. IMPRESSION: No evidence of pulmonary embolus. No acute cardiopulmonary disease. Electronically Signed   By: Rolm Baptise M.D.   On: 07/23/2020 15:37    Procedures Procedures (including critical care time)  Medications Ordered in ED Medications  sodium chloride flush (NS) 0.9 % injection 3 mL (3 mLs Intravenous Not Given 07/23/20 1441)  iohexol (OMNIPAQUE) 350 MG/ML injection 72 mL (72 mLs Intravenous Contrast Given 07/23/20 1529)    ED Course  I have reviewed the triage vital  signs and the nursing notes.  Pertinent labs & imaging results that were available during my care of the patient were reviewed by me and considered in my medical decision making (see chart for details).    MDM Rules/Calculators/A&P                          Patient presenting for evaluation of sudden onset right-sided chest pain with radiation to the right upper extremity.  Symptoms were constant but had resolved upon my assessment without intervention.  The patient is afebrile, mildly hypertensive on arrival.  Vital signs otherwise stable.  He is nontoxic in appearance.  Pain is not pleuritic, exertional, or reproducible on palpation.  He has a history of DVT for which he is anticoagulated on Eliquis.  Given the sudden onset of symptoms, concern for the possibility of PE.  We will proceed with PE study as well as EKG, blood work, and serial troponin trending.  Lab work reviewed and interpreted by myself shows no leukocytosis, no anemia, no metabolic derangements, no renal insufficiency.  Serial troponins are negative and his EKG shows no acute ischemic abnormalities.  Low suspicion of ACS/MI at this time.  Imaging shows no evidence of PE, pneumonia, pneumothorax, dissection, cardiac tamponade, esophageal rupture.  Patient's abdomen is soft and nontender, doubt acute surgical abdominal pathology.  On reevaluation patient is resting comfortably in no distress.  Reports no recurrence of pain.  He feels comfortable with discharge home.  No further emergent work-up required at this time.  Recommend close follow-up with PCP for reevaluation of symptoms.  Discussed strict ED return precautions.  Patient and wife at the bedside verbalized understanding of and agreement with plan and patient is stable for discharge at this time.   Final Clinical Impression(s) / ED Diagnoses Final diagnoses:  Atypical chest pain    Rx / DC Orders ED Discharge Orders    None       Renita Papa, [name] 07/23/20  1658    Pattricia Boss, MD 07/24/20 [phone]

## 2020-07-23 NOTE — ED Notes (Signed)
Patient transported to CT 

## 2020-07-23 NOTE — ED Triage Notes (Signed)
Pt in w/sharp, R sided cp that began 45 min PTA while walking around at the [employer]. Pain radiates to R arm, denies sob or n/v, but does feel clammy. Hx of DVT's, take Eliquis.

## 2020-08-17 ENCOUNTER — Ambulatory Visit (INDEPENDENT_AMBULATORY_CARE_PROVIDER_SITE_OTHER): Payer: Medicare PPO | Admitting: Urology

## 2020-08-17 ENCOUNTER — Other Ambulatory Visit: Payer: Self-pay

## 2020-08-17 ENCOUNTER — Encounter: Payer: Self-pay | Admitting: Urology

## 2020-08-17 VITALS — BP 133/81 | HR 72 | Temp 98.2°F | Ht 71.0 in | Wt 219.8 lb

## 2020-08-17 DIAGNOSIS — E291 Testicular hypofunction: Secondary | ICD-10-CM | POA: Diagnosis not present

## 2020-08-17 DIAGNOSIS — N5201 Erectile dysfunction due to arterial insufficiency: Secondary | ICD-10-CM | POA: Diagnosis not present

## 2020-08-17 DIAGNOSIS — N401 Enlarged prostate with lower urinary tract symptoms: Secondary | ICD-10-CM

## 2020-08-17 DIAGNOSIS — R351 Nocturia: Secondary | ICD-10-CM | POA: Insufficient documentation

## 2020-08-17 DIAGNOSIS — N138 Other obstructive and reflux uropathy: Secondary | ICD-10-CM

## 2020-08-17 LAB — URINALYSIS, ROUTINE W REFLEX MICROSCOPIC
Bilirubin, UA: NEGATIVE
Glucose, UA: NEGATIVE
Ketones, UA: NEGATIVE
Leukocytes,UA: NEGATIVE
Nitrite, UA: NEGATIVE
Protein,UA: NEGATIVE
RBC, UA: NEGATIVE
Specific Gravity, UA: 1.025 (ref 1.005–1.030)
Urobilinogen, Ur: 0.2 mg/dL (ref 0.2–1.0)
pH, UA: 5.5 (ref 5.0–7.5)

## 2020-08-17 MED ORDER — ALFUZOSIN HCL ER 10 MG PO TB24: 10.0000 mg | ORAL_TABLET | Freq: Every day | ORAL | 3 refills | Status: DC

## 2020-08-17 MED ORDER — TESTOSTERONE CYPIONATE 200 MG/ML IM SOLN: 100.0000 mg | INTRAMUSCULAR | 3 refills | Status: DC

## 2020-08-17 NOTE — Progress Notes (Signed)
08/17/2020 3:17 PM   Harold Schwartz February 24, 1956 [id number]  Referring provider: Jearld Fenton, NP [address] [place],  [place] [address]  followup hypogonadism  HPI: Harold Schwartz is a 64yo here for followup for hypogonadism, BPH, and ED.  Testosterone 837. Hgb 16.4. PSA 4.4. He injects testosterone IM 100mg  weekly. Good energy. Good Libido.  He has stable LUTS on uroxatral 10mg .  He takes tadalafil 20mg  PRN with good results  His records from [facility] are as follows: I am having trouble with my erections.  HPI: Harold Schwartz is a 64 year-old male established patient who is here for erectile dysfunction.  He first stated noticing pain on approximately 06/16/2013. His symptoms did begin gradually. His symptoms have been worse over the last year.   He does have difficulties achieving an erection. He does have problems maintaining his erections. His erections are straight. He has tried Viagra. It did not work. He has tried Cialis. It did work.   He does not have premature ejaculation. He does not have trouble reaching climax. He does have anxiety because of the symptoms.   07/03/2018: He has previously tried cilais and viagra with good results   08/15/2018: He has stable ED with tadalafil.   02/17/2019: He is having good results with tadalfil 20mg  prn   04/16/2019: He is using tadalafil prn 20mg  with good results   07/07/2019: ED has improved with TRT and tadalafil 20mg    01/07/2020: Ed stable on tadalafil 20mg  prn     CC: I have a decreased testosterone level.  HPI: He has had the symptoms for 8 years. His symptoms have gotten worse over the last year. He does become fatigued easily. He has not gained weight.   His sex drive has decreased. He does have problems with erections.   He has not had injuries to the testicles or scrotum. He has not had scrotal surgery.   04/16/2019: The patient has been on TRT for 8 years. He is on gel and his last testosterone was 129 on treatment    07/07/2019: testosterone 743, estradiol 52, CMP and CBC normal. hgb 15.5   01/07/2020: Testosteorn 867, esteadiol 80, hgb 16.3. Good energy. no ED     CC: I have symptoms of an enlarged prostate.  HPI: He first noticed the symptoms approximately 06/16/2017. His symptoms have gotten worse over the last year. He has been treated with Uroxatral. The patient has never had a surgical procedure for bladder outlet obstruction to his prostate.   He usually gets up at night to urinate 1 time. He does have to wait a long time to start his urinary stream. He does have to strain or bear down to start his urinary stream. He does not have a good size and strength to his urinary stream. He does dribble at the end of urination. He is having problems with emptying his bladder well.   His urine has not shut off completely. He has not previously had an indwelling catheter in for more than two weeks at a time.   He has had a PSA done. He does not have any family members who have been diagnosed with prostate cancer.   07/03/2018: For the past year he has noted worsening LUTS. PSA 3.7 in Nov 2018. No previous BPH Therapy.   08/15/2018: last visit he was started on uroxatral 10mg  qhs. He notes improvement in nocturia 1-2x, urgency. stream is stronger.  He notes constipation which started after he started the uroxatral.  02/17/2019: He is taking uroxatral. He was doing well until 2 weeks ago when he developed new urgency with occasional urge incontinence. He has a feeling of incomplete emptying. UA is normal.   07/07/2019: He has stable luts on uroxatral 10mg  qhs.   01/07/2020: He has stable LUTS on uroxatral 10mg  qhs      PMH: Past Medical History:  Diagnosis Date  . Atrial flutter ([place])   . Colon polyps   . Depression   . DVT (deep vein thrombosis) in pregnancy    last one in March 2019 taking eliquis  . Genital herpes   . Heart murmur   . Hemorrhoids   . Hyperlipidemia   . Hypogonadism in male   .  Neuromuscular disorder ([place])    slight numbnes in right foot    Surgical History: Past Surgical History:  Procedure Laterality Date  . A-FLUTTER ABLATION N/A 03/22/2017   Procedure: A-Flutter Ablation;  Surgeon: Deboraha Sprang, MD;  Location: [facility];  Service: Cardiovascular;  Laterality: N/A;  . COLONOSCOPY WITH PROPOFOL N/A 05/16/2018   Procedure: COLONOSCOPY WITH PROPOFOL;  Surgeon: Lucilla Lame, MD;  Location: [facility];  Service: Endoscopy;  Laterality: N/A;  . KNEE ARTHROSCOPY    . POLYPECTOMY  05/16/2018   Procedure: POLYPECTOMY;  Surgeon: Lucilla Lame, MD;  Location: [facility];  Service: Endoscopy;;  . TENOTOMY ACHILLES TENDON Right 08/22/2018  . VASECTOMY  1990    Home Medications:  Allergies as of 08/17/2020   No Known Allergies     Medication List       Accurate as of August 17, 2020  3:17 PM. If you have any questions, ask your nurse or doctor.        STOP taking these medications   predniSONE 10 MG tablet Commonly known as: DELTASONE Stopped by: Nicolette Bang, MD     TAKE these medications   alfuzosin 10 MG 24 hr tablet Commonly known as: UROXATRAL Take 10 mg by mouth at bedtime.   B-D 3CC LUER-LOK SYR 23GX1" 23G X 1" 3 ML Misc Generic drug: SYRINGE-NEEDLE (DISP) 3 ML   BD Hypodermic Needle 18G X 1" Misc Generic drug: NEEDLE (DISP) 18 G USE AS DIRECTED BY PROVIDER What changed: See the new instructions.   Celery Seed Oil Take 1 capsule by mouth in the morning and at bedtime.   cyclobenzaprine 10 MG tablet Commonly known as: FLEXERIL Take 1 tablet (10 mg total) by mouth 3 times/day as needed-between meals & bedtime for muscle spasms.   Eliquis 5 MG Tabs tablet Generic drug: apixaban TAKE 1 TABLET BY MOUTH TWICE A DAY What changed: how much to take   ezetimibe 10 MG tablet Commonly known as: ZETIA TAKE 1 TABLET BY MOUTH EVERY DAY   Fish Oil 1200 MG Caps Take 2,400-3,600 capsules by mouth daily. Take 2,400 mg  by mouth in the morning and 3,600 mg in the evening   glucosamine-chondroitin 500-400 MG tablet Take 1 tablet by mouth 2 (two) times daily.   ibuprofen 200 MG tablet Commonly known as: ADVIL Take 200-400 mg by mouth every 8 (eight) hours as needed for headache or mild pain.   Magnesium 300 MG Caps Take 300 mg by mouth at bedtime.   testosterone cypionate 200 MG/ML injection Commonly known as: DEPOTESTOSTERONE CYPIONATE Inject 100 mg into the muscle every Friday.   valACYclovir 1000 MG tablet Commonly known as: VALTREX TAKE ONE TABLET BY MOUTH DAILY       Allergies:  No Known Allergies  Family History: Family History  Problem Relation Age of Onset  . Breast cancer Mother   . Heart disease Mother   . Hyperlipidemia Mother   . Hypertension Mother   . Colon cancer Father   . Heart disease Father   . Alzheimer's disease Father   . Hyperlipidemia Father   . Hyperlipidemia Brother     Social History:  reports that he has never smoked. He has never used smokeless tobacco. He reports current alcohol use of about 7.0 standard drinks of alcohol per week. He reports that he does not use drugs.  ROS: All other review of systems were reviewed and are negative except what is noted above in HPI  Physical Exam: BP 133/81   Pulse 72   Temp 98.2 F (36.8 C)   Ht 5\' 11"  (1.803 m)   Wt 219 lb 12.8 oz (99.7 kg)   BMI 30.66 kg/m   Constitutional:  Alert and oriented, No acute distress. HEENT: [place] AT, moist mucus membranes.  Trachea midline, no masses. Cardiovascular: No clubbing, cyanosis, or edema. Respiratory: Normal respiratory effort, no increased work of breathing. GI: Abdomen is soft, nontender, nondistended, no abdominal masses GU: No CVA tenderness.  Lymph: No cervical or inguinal lymphadenopathy. Skin: No rashes, bruises or suspicious lesions. Neurologic: Grossly intact, no focal deficits, moving all 4 extremities. Psychiatric: Normal mood and affect.  Laboratory  Data: Lab Results  Component Value Date   WBC 4.5 07/23/2020   HGB 16.2 07/23/2020   HCT 48.5 07/23/2020   MCV 92.6 07/23/2020   PLT 132 (L) 07/23/2020    Lab Results  Component Value Date   CREATININE 1.02 07/23/2020    Lab Results  Component Value Date   PSA 3.61 10/21/2017   PSA 3.47 06/29/2016   PSA 2.34 04/18/2015    Lab Results  Component Value Date   TESTOSTERONE 129.20 (L) 03/18/2019    Lab Results  Component Value Date   HGBA1C 5.5 09/15/2019    Urinalysis No results found for: COLORURINE, APPEARANCEUR, LABSPEC, PHURINE, GLUCOSEU, HGBUR, BILIRUBINUR, KETONESUR, PROTEINUR, UROBILINOGEN, NITRITE, LEUKOCYTESUR  No results found for: LABMICR, [place], RBCUA, LABEPIT, MUCUS, BACTERIA  Pertinent Imaging:  No results found for this or any previous visit.  No results found for this or any previous visit.  No results found for this or any previous visit.  No results found for this or any previous visit.  No results found for this or any previous visit.  No results found for this or any previous visit.  No results found for this or any previous visit.  No results found for this or any previous visit.   Assessment & Plan:    1. Male hypogonadism -continue IM testosterone 100mg  q 1 week -RTC 6 months labs - Urinalysis, Routine w reflex microscopic  2. Benign prostatic hyperplasia with urinary obstruction -continue uroxatral 10mg  qhs  3. Nocturia -Continue uroxatral 10mg    4. Erectile dysfunction due to arterial insufficiency Tadalafil 20mg     No follow-ups on file.  Nicolette Bang, MD  [facility] [place]

## 2020-08-17 NOTE — Progress Notes (Signed)
Urological Symptom Review ° °Patient is experiencing the following symptoms: °Get up at night to urinate ° ° °Review of Systems ° °Gastrointestinal (upper)  : °Negative for upper GI symptoms ° °Gastrointestinal (lower) : °Negative for lower GI symptoms ° °Constitutional : °Negative for symptoms ° °Skin: °Negative for skin symptoms ° °Eyes: °Negative for eye symptoms ° °Ear/Nose/Throat : °Negative for Ear/Nose/Throat symptoms ° °Hematologic/Lymphatic: °Negative for Hematologic/Lymphatic symptoms ° °Cardiovascular : °Leg swelling ° °Respiratory : °Negative for respiratory symptoms ° °Endocrine: °Negative for endocrine symptoms ° °Musculoskeletal: °Negative for musculoskeletal symptoms ° °Neurological: °Negative for neurological symptoms ° °Psychologic: °Negative for psychiatric symptoms °

## 2020-08-17 NOTE — Patient Instructions (Signed)
Testosterone injection What is this medicine? TESTOSTERONE (tes TOS ter one) is the main male hormone. It supports normal male development such as muscle growth, facial hair, and deep voice. It is used in males to treat low testosterone levels. This medicine may be used for other purposes; ask your health care provider or pharmacist if you have questions. COMMON BRAND NAME(S): Andro-L.A., Aveed, Delatestryl, Depo-Testosterone, Virilon What should I tell my health care provider before I take this medicine? They need to know if you have any of these conditions:  cancer  diabetes  heart disease  kidney disease  liver disease  lung disease  prostate disease  an unusual or allergic reaction to testosterone, other medicines, foods, dyes, or preservatives  pregnant or trying to get pregnant  breast-feeding How should I use this medicine? This medicine is for injection into a muscle. It is usually given by a health care professional in a hospital or clinic setting. Contact your pediatrician regarding the use of this medicine in children. While this medicine may be prescribed for children as young as 65 years of age for selected conditions, precautions do apply. Overdosage: If you think you have taken too much of this medicine contact a poison control center or emergency room at once. NOTE: This medicine is only for you. Do not share this medicine with others. What if I miss a dose? Try not to miss a dose. Your doctor or health care professional will tell you when your next injection is due. Notify the office if you are unable to keep an appointment. What may interact with this medicine?  medicines for diabetes  medicines that treat or prevent blood clots like warfarin  oxyphenbutazone  propranolol  steroid medicines like prednisone or cortisone This list may not describe all possible interactions. Give your health care provider a list of all the medicines, herbs, non-prescription  drugs, or dietary supplements you use. Also tell them if you smoke, drink alcohol, or use illegal drugs. Some items may interact with your medicine. What should I watch for while using this medicine? Visit your doctor or health care professional for regular checks on your progress. They will need to check the level of testosterone in your blood. This medicine is only approved for use in men who have low levels of testosterone related to certain medical conditions. Heart attacks and strokes have been reported with the use of this medicine. Notify your doctor or health care professional and seek emergency treatment if you develop breathing problems; changes in vision; confusion; chest pain or chest tightness; sudden arm pain; severe, sudden headache; trouble speaking or understanding; sudden numbness or weakness of the face, arm or leg; loss of balance or coordination. Talk to your doctor about the risks and benefits of this medicine. This medicine may affect blood sugar levels. If you have diabetes, check with your doctor or health care professional before you change your diet or the dose of your diabetic medicine. Testosterone injections are not commonly used in women. Women should inform their doctor if they wish to become pregnant or think they might be pregnant. There is a potential for serious side effects to an unborn child. Talk to your health care professional or pharmacist for more information. Talk with your doctor or health care professional about your birth control options while taking this medicine. This drug is banned from use in athletes by most athletic organizations. What side effects may I notice from receiving this medicine? Side effects that you should report to  your doctor or health care professional as soon as possible:  allergic reactions like skin rash, itching or hives, swelling of the face, lips, or tongue  breast enlargement  breathing problems  changes in emotions or  moods  deep or hoarse voice  irregular menstrual periods  signs and symptoms of liver injury like dark yellow or brown urine; general ill feeling or flu-like symptoms; light-colored stools; loss of appetite; nausea; right upper belly pain; unusually weak or tired; yellowing of the eyes or skin  stomach pain  swelling of the ankles, feet, hands  too frequent or persistent erections  trouble passing urine or change in the amount of urine Side effects that usually do not require medical attention (report to your doctor or health care professional if they continue or are bothersome):  acne  change in sex drive or performance  facial hair growth  hair loss  headache This list may not describe all possible side effects. Call your doctor for medical advice about side effects. You may report side effects to FDA at 1-800-FDA-1088. Where should I keep my medicine? Keep out of the reach of children. This medicine can be abused. Keep your medicine in a safe place to protect it from theft. Do not share this medicine with anyone. Selling or giving away this medicine is dangerous and against the law. Store at room temperature between 20 and 25 degrees C (68 and 77 degrees F). Do not freeze. Protect from light. Follow the directions for the product you are prescribed. Throw away any unused medicine after the expiration date. NOTE: This sheet is a summary. It may not cover all possible information. If you have questions about this medicine, talk to your doctor, pharmacist, or health care provider.  2020 Elsevier/Gold Standard (2016-01-07 07:33:55)  

## 2020-08-21 ENCOUNTER — Other Ambulatory Visit: Payer: Self-pay | Admitting: Internal Medicine

## 2020-09-22 ENCOUNTER — Other Ambulatory Visit: Payer: Self-pay

## 2020-09-22 ENCOUNTER — Ambulatory Visit
Admission: EM | Admit: 2020-09-22 | Discharge: 2020-09-22 | Disposition: A | Discharge status: Home / Self Care | Payer: Medicare PPO | Attending: Family Medicine | Admitting: Family Medicine

## 2020-09-22 DIAGNOSIS — H938X1 Other specified disorders of right ear: Secondary | ICD-10-CM

## 2020-09-22 DIAGNOSIS — H60391 Other infective otitis externa, right ear: Secondary | ICD-10-CM

## 2020-09-22 MED ORDER — POLYMYXIN B-TRIMETHOPRIM 10000-0.1 UNIT/ML-% OP SOLN: 2.0000 [drp] | OPHTHALMIC | 0 refills | Status: AC

## 2020-09-22 NOTE — ED Provider Notes (Signed)
[facility]   [id number] 09/22/20 Arrival Time: 0623  CC: EAR PAIN  SUBJECTIVE: History from: patient.  Harold Schwartz is a 64 y.o. male who presents with of right ear fullness for the last 3 days. Denies a precipitating event, such as swimming or wearing ear plugs. Patient states the pain is constant and achy in character. Patient has not taken OTC medications for this. Symptoms are made worse with lying down. Denies similar symptoms in the past. Denies fever, chills, fatigue, sinus pain, rhinorrhea, ear discharge, sore throat, SOB, wheezing, chest pain, nausea, changes in bowel or bladder habits.    ROS: As per HPI.  All other pertinent ROS negative.     Past Medical History:  Diagnosis Date  . Atrial flutter ([place])   . Colon polyps   . Depression   . DVT (deep vein thrombosis) in pregnancy    last one in March 2019 taking eliquis  . Genital herpes   . Heart murmur   . Hemorrhoids   . Hyperlipidemia   . Hypogonadism in male   . Neuromuscular disorder ([place])    slight numbnes in right foot   Past Surgical History:  Procedure Laterality Date  . A-FLUTTER ABLATION N/A 03/22/2017   Procedure: A-Flutter Ablation;  Surgeon: Deboraha Sprang, MD;  Location: [facility];  Service: Cardiovascular;  Laterality: N/A;  . COLONOSCOPY WITH PROPOFOL N/A 05/16/2018   Procedure: COLONOSCOPY WITH PROPOFOL;  Surgeon: Lucilla Lame, MD;  Location: [facility];  Service: Endoscopy;  Laterality: N/A;  . KNEE ARTHROSCOPY    . POLYPECTOMY  05/16/2018   Procedure: POLYPECTOMY;  Surgeon: Lucilla Lame, MD;  Location: [facility];  Service: Endoscopy;;  . TENOTOMY ACHILLES TENDON Right 08/22/2018  . VASECTOMY  1990   No Known Allergies No current facility-administered medications on file prior to encounter.   Current Outpatient Medications on File Prior to Encounter  Medication Sig Dispense Refill  . alfuzosin (UROXATRAL) 10 MG 24 hr tablet Take 1 tablet (10 mg total) by  mouth at bedtime. 90 tablet 3  . B-D 3CC LUER-LOK SYR 23GX1" 23G X 1" 3 ML MISC     . BD HYPODERMIC NEEDLE 18G X 1" MISC USE AS DIRECTED BY PROVIDER (Patient taking differently: as directed. ) 12 each 10  . Celery Seed OIL Take 1 capsule by mouth in the morning and at bedtime.     . cyclobenzaprine (FLEXERIL) 10 MG tablet Take 1 tablet (10 mg total) by mouth 3 times/day as needed-between meals & bedtime for muscle spasms. 30 tablet 0  . ELIQUIS 5 MG TABS tablet TAKE 1 TABLET BY MOUTH TWICE A DAY (Patient taking differently: Take 5 mg by mouth 2 (two) times daily. ) 180 tablet 0  . ezetimibe (ZETIA) 10 MG tablet TAKE 1 TABLET BY MOUTH EVERY DAY (Patient taking differently: Take 10 mg by mouth daily. ) 90 tablet 0  . glucosamine-chondroitin 500-400 MG tablet Take 1 tablet by mouth 2 (two) times daily.    Marland Kitchen ibuprofen (ADVIL) 200 MG tablet Take 200-400 mg by mouth every 8 (eight) hours as needed for headache or mild pain.    . Magnesium 300 MG CAPS Take 300 mg by mouth at bedtime.     . Omega-3 Fatty Acids (FISH OIL) 1200 MG CAPS Take 2,400-3,600 capsules by mouth daily. Take 2,400 mg by mouth in the morning and 3,600 mg in the evening    . testosterone cypionate (DEPOTESTOSTERONE CYPIONATE) 200 MG/ML injection Inject 0.5  mLs (100 mg total) into the muscle every Friday. 10 mL 3  . valACYclovir (VALTREX) 1000 MG tablet TAKE ONE TABLET BY MOUTH DAILY 30 tablet 0   Social History   Socioeconomic History  . Marital status: Married    Spouse name: Not on file  . Number of children: 2  . Years of education: Not on file  . Highest education level: Not on file  Occupational History  . Occupation: Engineer, site  Tobacco Use  . Smoking status: Never Smoker  . Smokeless tobacco: Never Used  Substance and Sexual Activity  . Alcohol use: Yes    Alcohol/week: 7.0 standard drinks    Types: 7 Shots of liquor per week    Comment: moderate  . Drug use: No  . Sexual activity: Not on file  Other Topics  Concern  . Not on file  Social History Narrative  . Not on file   Social Determinants of Health   Financial Resource Strain:   . Difficulty of Paying Living Expenses: Not on file  Food Insecurity:   . Worried About Charity fundraiser in the Last Year: Not on file  . Ran Out of Food in the Last Year: Not on file  Transportation Needs:   . Lack of Transportation (Medical): Not on file  . Lack of Transportation (Non-Medical): Not on file  Physical Activity:   . Days of Exercise per Week: Not on file  . Minutes of Exercise per Session: Not on file  Stress:   . Feeling of Stress : Not on file  Social Connections:   . Frequency of Communication with Friends and Family: Not on file  . Frequency of Social Gatherings with Friends and Family: Not on file  . Attends Religious Services: Not on file  . Active Member of Clubs or Organizations: Not on file  . Attends Archivist Meetings: Not on file  . Marital Status: Not on file  Intimate Partner Violence:   . Fear of Current or Ex-Partner: Not on file  . Emotionally Abused: Not on file  . Physically Abused: Not on file  . Sexually Abused: Not on file   Family History  Problem Relation Age of Onset  . Breast cancer Mother   . Heart disease Mother   . Hyperlipidemia Mother   . Hypertension Mother   . Colon cancer Father   . Heart disease Father   . Alzheimer's disease Father   . Hyperlipidemia Father   . Hyperlipidemia Brother     OBJECTIVE:  Vitals:   09/22/20 1457 09/22/20 1500 09/22/20 1504  BP:   136/84  Pulse:  76   Resp:  16   Temp:  98.4 F (36.9 C)   TempSrc:  Oral   SpO2:  96%   Weight: 220 lb (99.8 kg)    Height: 5\' 11"  (1.803 m)       General appearance: alert; appears fatigued HEENT: Ears: L EAC clear, R EAC with erythema, swelling, drainage, tenderness, TMs pearly gray with visible cone of light, without erythema; Eyes: PERRL, EOMI grossly; Sinuses nontender to palpation; Nose: clear rhinorrhea;  Throat: oropharynx mildly erythematous, tonsils 1+ without white tonsillar exudates, uvula midline Neck: supple without LAD Lungs: unlabored respirations, symmetrical air entry; cough: absent; no respiratory distress Heart: regular rate and rhythm.  Radial pulses 2+ symmetrical bilaterally Skin: warm and dry Psychological: alert and cooperative; normal mood and affect  Imaging: No results found.   ASSESSMENT & PLAN:  1. Infective otitis  externa of right ear   2. Ear fullness, right     Meds ordered this encounter  Medications  . trimethoprim-polymyxin b (POLYTRIM) ophthalmic solution    Sig: Place 2 drops into the right ear every 4 (four) hours for 7 days.    Dispense:  10 mL    Refill:  0    Order Specific Question:   Supervising Provider    Answer:   Chase Picket [[id number]]    Rest and drink plenty of fluids Prescribed polytrim drops Take medications as directed and to completion Continue to use OTC ibuprofen and/ or tylenol as needed for pain control Follow up with PCP if symptoms persists Return here or go to the ER if you have any new or worsening symptoms   Reviewed expectations re: course of current medical issues. Questions answered. Outlined signs and symptoms indicating need for more acute intervention. Patient verbalized understanding. After Visit Summary given.         Faustino Congress, NP 09/22/20 1525

## 2020-09-22 NOTE — ED Triage Notes (Signed)
Patient in office [place] for right ear fullness sx started 3 days ago.  OTC: none  Denies:earache ,fever

## 2020-09-22 NOTE — Discharge Instructions (Signed)
I have sent in polytrim drops for you to use twice a day for 7 days  Follow up with this office or with primary care if symptoms are not improving by Monday  Follow up with the ER for high fever, trouble swallowing, trouble breathing, other concerning symptoms

## 2020-10-02 ENCOUNTER — Other Ambulatory Visit: Payer: Self-pay | Admitting: Urology

## 2020-10-02 ENCOUNTER — Other Ambulatory Visit: Payer: Self-pay | Admitting: Internal Medicine

## 2020-10-11 ENCOUNTER — Other Ambulatory Visit: Payer: Self-pay | Admitting: Internal Medicine

## 2020-10-12 ENCOUNTER — Other Ambulatory Visit: Payer: Self-pay | Admitting: Internal Medicine

## 2020-10-18 ENCOUNTER — Ambulatory Visit (INDEPENDENT_AMBULATORY_CARE_PROVIDER_SITE_OTHER): Payer: Medicare PPO | Admitting: Internal Medicine

## 2020-10-18 ENCOUNTER — Encounter: Payer: Self-pay | Admitting: Internal Medicine

## 2020-10-18 ENCOUNTER — Other Ambulatory Visit: Payer: Self-pay

## 2020-10-18 VITALS — BP 130/82 | HR 62 | Temp 97.6°F | Wt 225.0 lb

## 2020-10-18 DIAGNOSIS — H60391 Other infective otitis externa, right ear: Secondary | ICD-10-CM | POA: Diagnosis not present

## 2020-10-18 DIAGNOSIS — H6983 Other specified disorders of Eustachian tube, bilateral: Secondary | ICD-10-CM | POA: Diagnosis not present

## 2020-10-18 NOTE — Patient Instructions (Signed)
Ear Barotrauma, Adult Ear barotrauma is irritation and swelling (inflammation) around the eardrum (middle ear). This happens when a change in air pressure causes a blockage in a tube in the ear (eustachian tube). A pressure change may happen when:  Flying in an airplane.  Coming to the surface too quickly when scuba diving.  Going to a high place (high altitude) quickly.  Being too close to an explosion or blast. Follow these instructions at home: General instructions  Take over-the-counter and prescription medicines only as told by your doctor.  "Pop" your ears (equalize pressure) as told by your doctor. Ways to pop your ears include: ? Yawning. ? Chewing gum. ? Swallowing. ? Holding your nose closed and gently blowing.  Do not put anything into your ears to clean or unplug them. Ear drops will not help.  Do not do the following until your doctor says it is okay: ? Travel to places that are high above sea level, such as mountains. ? Fly. ? Scuba dive.  Keep all follow-up visits as told by your doctor. This is important. How is this prevented?  Avoid activities that may bring pressure changes when you have symptoms of a cold or stuffiness (congestion).  If you have stuffiness and will be flying, take medicine to relieve stuffiness (decongestant) 30-60 minutes before flying.  When flying, during takeoff and landing: ? Chew gum. ? Swallow hard and often.  Hold your nose closed and gently blow to pop your ears.  Yawn during air pressure changes.  If scuba diving, dive feet first and pop your ears often as you go deeper. Contact a doctor if:  Your symptoms get worse.  Your symptoms do not get better.  You feel dizzy (vertigo).  You have hearing loss.  You have a fever. Get help right away if:  You have very bad ear pain.  You have a very bad headache.  You have very bad dizziness.  You have blood or pus coming from your ear.  You have balance  problems.  You cannot move or feel part of your face. Summary  Ear barotrauma is irritation and swelling (inflammation) around the eardrum (middle ear).  This condition may be treated with medicines or by "popping" your ears (equalizing pressure).  You can take steps to help prevent ear barotrauma. This information is not intended to replace advice given to you by your health care provider. Make sure you discuss any questions you have with your health care provider. Document Revised: 11/15/2017 Document Reviewed: 03/15/2017 Elsevier Patient Education  [place].

## 2020-10-18 NOTE — Progress Notes (Signed)
Subjective:    Patient ID: Harold Schwartz, male    DOB: 04/07/1956, 64 y.o.   MRN: [id number]  HPI  Pt presents to the clinic today with c/o right ear fullness. This started 2 weeks ago. He was seen at [facility] 10/7 for the same, diagnosed with infective otitis externa, prescribed Polytrim which he took without complete resolution of symptoms. He reports ear fullness, popping and cracking but denies pain, drainage or loss of hearing. He denies runny nose, nasal congestion, itchy watery eyes or sore throat. He has tried Flonase OTC with some relief.  Review of Systems      Past Medical History:  Diagnosis Date  . Atrial flutter ([place])   . Colon polyps   . Depression   . DVT (deep vein thrombosis) in pregnancy    last one in March 2019 taking eliquis  . Genital herpes   . Heart murmur   . Hemorrhoids   . Hyperlipidemia   . Hypogonadism in male   . Neuromuscular disorder ([facility])    slight numbnes in right foot    Current Outpatient Medications  Medication Sig Dispense Refill  . ezetimibe (ZETIA) 10 MG tablet TAKE 1 TABLET BY MOUTH EVERY DAY 90 tablet 0  . alfuzosin (UROXATRAL) 10 MG 24 hr tablet Take 1 tablet (10 mg total) by mouth at bedtime. 90 tablet 3  . B-D 3CC LUER-LOK SYR 23GX1" 23G X 1" 3 ML MISC USE AS DIRECTED BY PROVIDER 12 each 5  . BD HYPODERMIC NEEDLE 18G X 1" MISC USE AS DIRECTED BY PROVIDER (Patient taking differently: as directed. ) 12 each 10  . Celery Seed OIL Take 1 capsule by mouth in the morning and at bedtime.     . cyclobenzaprine (FLEXERIL) 10 MG tablet Take 1 tablet (10 mg total) by mouth 3 times/day as needed-between meals & bedtime for muscle spasms. 30 tablet 0  . ELIQUIS 5 MG TABS tablet TAKE 1 TABLET BY MOUTH TWICE A DAY (Patient taking differently: Take 5 mg by mouth 2 (two) times daily. ) 180 tablet 0  . glucosamine-chondroitin 500-400 MG tablet Take 1 tablet by mouth 2 (two) times daily.    Marland Kitchen ibuprofen (ADVIL) 200 MG tablet Take 200-400 mg by mouth every 8  (eight) hours as needed for headache or mild pain.    . Magnesium 300 MG CAPS Take 300 mg by mouth at bedtime.     . Omega-3 Fatty Acids (FISH OIL) 1200 MG CAPS Take 2,400-3,600 capsules by mouth daily. Take 2,400 mg by mouth in the morning and 3,600 mg in the evening    . testosterone cypionate (DEPOTESTOSTERONE CYPIONATE) 200 MG/ML injection INJECT 0.5 MILLILITER INTRAMUSCULARLY ONCE WEEKLY 6 mL 5  . valACYclovir (VALTREX) 1000 MG tablet Take 1 tablet (1,000 mg total) by mouth daily. MUST SCHEDULE PHYSICAL 30 tablet 0   No current facility-administered medications for this visit.    No Known Allergies  Family History  Problem Relation Age of Onset  . Breast cancer Mother   . Heart disease Mother   . Hyperlipidemia Mother   . Hypertension Mother   . Colon cancer Father   . Heart disease Father   . Alzheimer's disease Father   . Hyperlipidemia Father   . Hyperlipidemia Brother     Social History   Socioeconomic History  . Marital status: Married    Spouse name: Not on file  . Number of children: 2  . Years of education: Not on file  . Highest  education level: Not on file  Occupational History  . Occupation: Engineer, site  Tobacco Use  . Smoking status: Never Smoker  . Smokeless tobacco: Never Used  Substance and Sexual Activity  . Alcohol use: Yes    Alcohol/week: 7.0 standard drinks    Types: 7 Shots of liquor per week    Comment: moderate  . Drug use: No  . Sexual activity: Not on file  Other Topics Concern  . Not on file  Social History Narrative  . Not on file   Social Determinants of Health   Financial Resource Strain:   . Difficulty of Paying Living Expenses: Not on file  Food Insecurity:   . Worried About Charity fundraiser in the Last Year: Not on file  . Ran Out of Food in the Last Year: Not on file  Transportation Needs:   . Lack of Transportation (Medical): Not on file  . Lack of Transportation (Non-Medical): Not on file  Physical Activity:    . Days of Exercise per Week: Not on file  . Minutes of Exercise per Session: Not on file  Stress:   . Feeling of Stress : Not on file  Social Connections:   . Frequency of Communication with Friends and Family: Not on file  . Frequency of Social Gatherings with Friends and Family: Not on file  . Attends Religious Services: Not on file  . Active Member of Clubs or Organizations: Not on file  . Attends Archivist Meetings: Not on file  . Marital Status: Not on file  Intimate Partner Violence:   . Fear of Current or Ex-Partner: Not on file  . Emotionally Abused: Not on file  . Physically Abused: Not on file  . Sexually Abused: Not on file     Constitutional: Denies fever, malaise, fatigue, headache or abrupt weight changes.  HEENT: Pt reports right ear fullness. Denies eye pain, eye redness, ear pain, ringing in the ears, wax buildup, runny nose, nasal congestion, bloody nose, or sore throat. Respiratory: Denies difficulty breathing, shortness of breath, cough or sputum production.   Cardiovascular: Denies chest pain, chest tightness, palpitations or swelling in the hands or feet.   No other specific complaints in a complete review of systems (except as listed in HPI above).  Objective:   Physical Exam  BP 130/82   Pulse 62   Temp 97.6 F (36.4 C) (Temporal)   Wt 225 lb (102.1 kg)   SpO2 98%   BMI 31.38 kg/m   Wt Readings from Last 3 Encounters:  09/22/20 220 lb (99.8 kg)  08/17/20 219 lb 12.8 oz (99.7 kg)  07/23/20 219 lb 12.8 oz (99.7 kg)    General: Appears his stated age, obese, in NAD. HEENT: Head: normal shape and size; Ears: Tm's gray and intact, normal light reflex, +serous effusion noted on the left;  Neck:  No adenopathy noted.  Cardiovascular: Normal rate. Pulmonary/Chest: Normal effort. Neurological: Alert and oriented.    BMET    Component Value Date/Time   NA 136 07/23/2020 1140   NA 139 03/20/2017 0815   K 4.3 07/23/2020 1140   CL 103  07/23/2020 1140   CO2 23 07/23/2020 1140   GLUCOSE 98 07/23/2020 1140   BUN 16 07/23/2020 1140   BUN 18 03/20/2017 0815   CREATININE 1.02 07/23/2020 1140   CALCIUM 9.4 07/23/2020 1140   GFRNONAA >60 07/23/2020 1140   GFRAA >60 07/23/2020 1140    Lipid Panel     Component  Value Date/Time   CHOL 145 09/14/2019 1234   TRIG 104.0 09/14/2019 1234   HDL 55.20 09/14/2019 1234   CHOLHDL 3 09/14/2019 1234   VLDL 20.8 09/14/2019 1234   LDLCALC 69 09/14/2019 1234    CBC    Component Value Date/Time   WBC 4.5 07/23/2020 1140   RBC 5.24 07/23/2020 1140   HGB 16.2 07/23/2020 1140   HGB 14.9 03/20/2017 0815   HCT 48.5 07/23/2020 1140   HCT 44.0 03/20/2017 0815   PLT 132 (L) 07/23/2020 1140   PLT 149 (L) 03/20/2017 0815   MCV 92.6 07/23/2020 1140   MCV 89 03/20/2017 0815   MCH 30.9 07/23/2020 1140   MCHC 33.4 07/23/2020 1140   RDW 13.2 07/23/2020 1140   RDW 14.3 03/20/2017 0815   LYMPHSABS 1.7 02/25/2018 1852   LYMPHSABS 1.5 03/20/2017 0815   MONOABS 0.4 02/25/2018 1852   EOSABS 0.2 02/25/2018 1852   EOSABS 0.1 03/20/2017 0815   BASOSABS 0.0 02/25/2018 1852   BASOSABS 0.0 03/20/2017 0815    Hgb A1C Lab Results  Component Value Date   HGBA1C 5.5 09/15/2019            Assessment & Plan:    UC Follow Up for Infective Otitis Media:  UC notes reviewed No evidence of infection based on exam Continue Flonase daily, both nostrils Offered referral to ENT but I do not think this would really be beneficial in anyway. He agrees   Return precautions discussed Webb Silversmith, NP This visit occurred during the SARS-CoV-2 public health emergency.  Safety protocols were in place, including screening questions prior to the visit, additional usage of staff PPE, and extensive cleaning of exam room while observing appropriate contact time as indicated for disinfecting solutions.

## 2020-12-14 ENCOUNTER — Other Ambulatory Visit: Payer: Self-pay | Admitting: Internal Medicine

## 2020-12-19 ENCOUNTER — Other Ambulatory Visit: Payer: Self-pay

## 2020-12-19 ENCOUNTER — Encounter: Payer: Self-pay | Admitting: Internal Medicine

## 2020-12-19 ENCOUNTER — Ambulatory Visit (INDEPENDENT_AMBULATORY_CARE_PROVIDER_SITE_OTHER): Payer: Medicare PPO | Admitting: Internal Medicine

## 2020-12-19 VITALS — BP 126/80 | HR 67 | Temp 97.0°F | Ht 71.0 in | Wt 222.0 lb

## 2020-12-19 DIAGNOSIS — I825Y2 Chronic embolism and thrombosis of unspecified deep veins of left proximal lower extremity: Secondary | ICD-10-CM

## 2020-12-19 DIAGNOSIS — I4892 Unspecified atrial flutter: Secondary | ICD-10-CM | POA: Diagnosis not present

## 2020-12-19 DIAGNOSIS — A6001 Herpesviral infection of penis: Secondary | ICD-10-CM

## 2020-12-19 DIAGNOSIS — N5201 Erectile dysfunction due to arterial insufficiency: Secondary | ICD-10-CM

## 2020-12-19 DIAGNOSIS — Z0000 Encounter for general adult medical examination without abnormal findings: Secondary | ICD-10-CM | POA: Diagnosis not present

## 2020-12-19 DIAGNOSIS — N138 Other obstructive and reflux uropathy: Secondary | ICD-10-CM

## 2020-12-19 DIAGNOSIS — N401 Enlarged prostate with lower urinary tract symptoms: Secondary | ICD-10-CM | POA: Diagnosis not present

## 2020-12-19 DIAGNOSIS — Z23 Encounter for immunization: Secondary | ICD-10-CM | POA: Diagnosis not present

## 2020-12-19 DIAGNOSIS — E781 Pure hyperglyceridemia: Secondary | ICD-10-CM

## 2020-12-19 DIAGNOSIS — E291 Testicular hypofunction: Secondary | ICD-10-CM

## 2020-12-19 LAB — COMPREHENSIVE METABOLIC PANEL
ALT: 24 U/L (ref 0–53)
AST: 22 U/L (ref 0–37)
Albumin: 4.3 g/dL (ref 3.5–5.2)
Alkaline Phosphatase: 37 U/L — ABNORMAL LOW (ref 39–117)
BUN: 19 mg/dL (ref 6–23)
CO2: 28 mEq/L (ref 19–32)
Calcium: 9.3 mg/dL (ref 8.4–10.5)
Chloride: 101 mEq/L (ref 96–112)
Creatinine, Ser: 1.31 mg/dL (ref 0.40–1.50)
GFR: 56.95 mL/min — ABNORMAL LOW (ref >60.00)
Glucose, Bld: 90 mg/dL (ref 70–99)
Potassium: 4.4 mEq/L (ref 3.5–5.1)
Sodium: 136 mEq/L (ref 135–145)
Total Bilirubin: 1 mg/dL (ref 0.2–1.2)
Total Protein: 7.1 g/dL (ref 6.0–8.3)

## 2020-12-19 LAB — CBC
HCT: 49.5 % (ref 39.0–52.0)
Hemoglobin: 16.8 g/dL (ref 13.0–17.0)
MCHC: 33.9 g/dL (ref 30.0–36.0)
MCV: 93 fl (ref 78.0–100.0)
Platelets: 142 10*3/uL — ABNORMAL LOW (ref 150.0–400.0)
RBC: 5.33 Mil/uL (ref 4.22–5.81)
RDW: 14.2 % (ref 11.5–15.5)
WBC: 5.1 10*3/uL (ref 4.0–10.5)

## 2020-12-19 LAB — LIPID PANEL
Cholesterol: 211 mg/dL — ABNORMAL HIGH (ref 0–200)
HDL: 61.2 mg/dL (ref >39.00)
LDL Cholesterol: 134 mg/dL — ABNORMAL HIGH (ref 0–99)
NonHDL: 149.81
Total CHOL/HDL Ratio: 3
Triglycerides: 78 mg/dL (ref 0.0–149.0)
VLDL: 15.6 mg/dL (ref 0.0–40.0)

## 2020-12-19 NOTE — Assessment & Plan Note (Signed)
Getting testosterone replacement, follows with urology

## 2020-12-19 NOTE — Assessment & Plan Note (Signed)
Continue Uroxatrol, prescribe by urology

## 2020-12-19 NOTE — Assessment & Plan Note (Signed)
Continue Eliquis CBC today 

## 2020-12-19 NOTE — Assessment & Plan Note (Signed)
Continue suppressive Valtrex 

## 2020-12-19 NOTE — Assessment & Plan Note (Signed)
Continue Ezetimibe and Fish Oil Encouraged him to consume a low fat diet CMET and lipid profile today

## 2020-12-19 NOTE — Progress Notes (Signed)
HPI:  Pt presents to the clinic today for his initial Medicare Wellness Exam  A Flutter: s/p ablation. He is taking Eliquis as prescribed. He does not follow with cardiology.  Chronic DVT: Managed on Eliquis. He does not follow with vascular.  Hypogonadism: Managed with Testosterone, prescribed by urology.  HLD: His last LDL was 69, 08/2019. He is taking Ezetimibe and Fish Oil as prescribed. He tries to consume a low fat diet.  BPH: Managed with Uroxatrol, prescribed by urology.  Hx of Genital Herpes: On suppressive Valtrex.  Past Medical History:  Diagnosis Date  . Atrial flutter ([facility])   . Colon polyps   . Depression   . DVT (deep vein thrombosis) in pregnancy    last one in March 2019 taking eliquis  . Genital herpes   . Heart murmur   . Hemorrhoids   . Hyperlipidemia   . Hypogonadism in male   . Neuromuscular disorder ([facility])    slight numbnes in right foot    Current Outpatient Medications  Medication Sig Dispense Refill  . ELIQUIS 5 MG TABS tablet TAKE 1 TABLET BY MOUTH TWICE A DAY 180 tablet 0  . alfuzosin (UROXATRAL) 10 MG 24 hr tablet Take 1 tablet (10 mg total) by mouth at bedtime. 90 tablet 3  . B-D 3CC LUER-LOK SYR 23GX1" 23G X 1" 3 ML MISC USE AS DIRECTED BY PROVIDER 12 each 5  . BD HYPODERMIC NEEDLE 18G X 1" MISC USE AS DIRECTED BY PROVIDER (Patient taking differently: as directed. ) 12 each 10  . Celery Seed OIL Take 1 capsule by mouth in the morning and at bedtime.     . cyclobenzaprine (FLEXERIL) 10 MG tablet Take 1 tablet (10 mg total) by mouth 3 times/day as needed-between meals & bedtime for muscle spasms. 30 tablet 0  . ezetimibe (ZETIA) 10 MG tablet TAKE 1 TABLET BY MOUTH EVERY DAY 90 tablet 0  . glucosamine-chondroitin 500-400 MG tablet Take 1 tablet by mouth 2 (two) times daily.    Marland Kitchen ibuprofen (ADVIL) 200 MG tablet Take 200-400 mg by mouth every 8 (eight) hours as needed for headache or mild pain.    . Magnesium 300 MG CAPS Take 300 mg by mouth at  bedtime.     . Omega-3 Fatty Acids (FISH OIL) 1200 MG CAPS Take 2,400-3,600 capsules by mouth daily. Take 2,400 mg by mouth in the morning and 3,600 mg in the evening    . testosterone cypionate (DEPOTESTOSTERONE CYPIONATE) 200 MG/ML injection INJECT 0.5 MILLILITER INTRAMUSCULARLY ONCE WEEKLY 6 mL 5  . valACYclovir (VALTREX) 1000 MG tablet TAKE ONE TABLET BY MOUTH DAILY *MUST SCHEDULE PHYSICAL FOR FURTHER REFILLS* 30 tablet 0   No current facility-administered medications for this visit.    No Known Allergies  Family History  Problem Relation Age of Onset  . Breast cancer Mother   . Heart disease Mother   . Hyperlipidemia Mother   . Hypertension Mother   . Colon cancer Father   . Heart disease Father   . Alzheimer's disease Father   . Hyperlipidemia Father   . Hyperlipidemia Brother   . Multiple sclerosis Sister     Social History   Socioeconomic History  . Marital status: Married    Spouse name: Not on file  . Number of children: 2  . Years of education: Not on file  . Highest education level: Not on file  Occupational History  . Occupation: TEFL teacher  Tobacco Use  . Smoking status: Never Smoker  .  Smokeless tobacco: Never Used  Substance and Sexual Activity  . Alcohol use: Yes    Alcohol/week: 7.0 standard drinks    Types: 7 Shots of liquor per week    Comment: moderate  . Drug use: No  . Sexual activity: Not on file  Other Topics Concern  . Not on file  Social History Narrative  . Not on file   Social Determinants of Health   Financial Resource Strain: Not on file  Food Insecurity: Not on file  Transportation Needs: Not on file  Physical Activity: Not on file  Stress: Not on file  Social Connections: Not on file  Intimate Partner Violence: Not on file    Hospitiliaztions: ER visit 09/2020, otitis externa  Health Maintenance:    Flu: 08/2020  Tetanus: 04/2015  Pneumovax: 01/2012  Prevnar: never  Zostavax: 04/2015  Shingrix: never  Covid:  Pfizer  PSA: checked by urology  Colon Screening: 04/2018  Eye Doctor: annually  Dental Exam: biannually   Providers:   PCP: Webb Silversmith, NP-C  Urologist: [facility]  I have personally reviewed and have noted:  1. The patient's medical and social history 2. Their use of alcohol, tobacco or illicit drugs 3. Their current medications and supplements 4. The patient's functional ability including ADL's, fall risks, home safety risks and hearing or visual impairment. 5. Diet and physical activities 6. Evidence for depression or mood disorder  Subjective:   Review of Systems:   Constitutional: Denies fever, malaise, fatigue, headache or abrupt weight changes.  HEENT: Denies eye pain, eye redness, ear pain, ringing in the ears, wax buildup, runny nose, nasal congestion, bloody nose, or sore throat. Respiratory: Denies difficulty breathing, shortness of breath, cough or sputum production.   Cardiovascular: Denies chest pain, chest tightness, palpitations or swelling in the hands or feet.  Gastrointestinal: Pt reports intermittent reflux. Denies abdominal pain, bloating, constipation, diarrhea or blood in the stool.  GU: Denies urgency, frequency, pain with urination, burning sensation, blood in urine, odor or discharge. Musculoskeletal: Pt reports intermittent left hip pain. Denies decrease in range of motion, difficulty with gait, muscle pain or joint swelling.  Skin: Denies redness, rashes, lesions or ulcercations.  Neurological: Denies dizziness, difficulty with memory, difficulty with speech or problems with balance and coordination.  Psych: Denies anxiety, depression, SI/HI.  No other specific complaints in a complete review of systems (except as listed in HPI above).  Objective:  PE:   BP 126/80   Pulse 67   Temp (!) 97 F (36.1 C) (Temporal)   Ht 5\' 11"  (1.803 m)   Wt 222 lb (100.7 kg)   SpO2 98%   BMI 30.96 kg/m   Wt Readings from Last 3 Encounters:  10/18/20 225 lb  (102.1 kg)  09/22/20 220 lb (99.8 kg)  08/17/20 219 lb 12.8 oz (99.7 kg)    General: Appears his stated age, obese in NAD. Skin: Warm, dry and intact. No rashes, lesions or ulcerations noted. HEENT: Head: normal shape and size; Eyes: sclera white, no icterus, conjunctiva pink, PERRLA and EOMs intact;   Neck: Neck supple, trachea midline. No masses, lumps or thyromegaly present.  Cardiovascular: Normal rate and rhythm. S1,S2 noted.  No murmur, rubs or gallops noted. No JVD or BLE edema. No carotid bruits noted. Pulmonary/Chest: Normal effort and positive vesicular breath sounds. No respiratory distress. No wheezes, rales or ronchi noted.  Abdomen: Soft and nontender. Normal bowel sounds. Ventral hernia noted. Liver, spleen and kidneys non palpable. Musculoskeletal: Normal range of motion.  Strength 5/5 BUE/BLE. No signs of joint swelling.  Neurological: Alert and oriented. Cranial nerves II-XII grossly intact. Coordination normal.  Psychiatric: Mood and affect normal. Behavior is normal. Judgment and thought content normal.     BMET    Component Value Date/Time   NA 136 07/23/2020 1140   NA 139 03/20/2017 0815   K 4.3 07/23/2020 1140   CL 103 07/23/2020 1140   CO2 23 07/23/2020 1140   GLUCOSE 98 07/23/2020 1140   BUN 16 07/23/2020 1140   BUN 18 03/20/2017 0815   CREATININE 1.02 07/23/2020 1140   CALCIUM 9.4 07/23/2020 1140   GFRNONAA >60 07/23/2020 1140   GFRAA >60 07/23/2020 1140    Lipid Panel     Component Value Date/Time   CHOL 145 09/14/2019 1234   TRIG 104.0 09/14/2019 1234   HDL 55.20 09/14/2019 1234   CHOLHDL 3 09/14/2019 1234   VLDL 20.8 09/14/2019 1234   LDLCALC 69 09/14/2019 1234    CBC    Component Value Date/Time   WBC 4.5 07/23/2020 1140   RBC 5.24 07/23/2020 1140   HGB 16.2 07/23/2020 1140   HGB 14.9 03/20/2017 0815   HCT 48.5 07/23/2020 1140   HCT 44.0 03/20/2017 0815   PLT 132 (L) 07/23/2020 1140   PLT 149 (L) 03/20/2017 0815   MCV 92.6  07/23/2020 1140   MCV 89 03/20/2017 0815   MCH 30.9 07/23/2020 1140   MCHC 33.4 07/23/2020 1140   RDW 13.2 07/23/2020 1140   RDW 14.3 03/20/2017 0815   LYMPHSABS 1.7 02/25/2018 1852   LYMPHSABS 1.5 03/20/2017 0815   MONOABS 0.4 02/25/2018 1852   EOSABS 0.2 02/25/2018 1852   EOSABS 0.1 03/20/2017 0815   BASOSABS 0.0 02/25/2018 1852   BASOSABS 0.0 03/20/2017 0815    Hgb A1C Lab Results  Component Value Date   HGBA1C 5.5 09/15/2019      Assessment and Plan:   Medicare Annual Wellness Visit:  Diet: He does eat meat. He consumes fruits and veggies daily. He occasionally eats fried foods. He drinks mostly hot tea, water. Physical activity: Walking Depression/mood screen: Negative, PHQ 9 score of 0 Hearing: Intact to whispered voice Visual acuity: Grossly normal, performs annual eye exam  ADLs: Capable Fall risk: None Home safety: Good Cognitive evaluation: Intact to orientation, naming, recall and repetition EOL planning: Adv directives, full code/ I agree  Preventative Medicine: Flu shot UTD. Tetanus UTD. Prevnar today. Will get pneumovax 12/2021. Covid UTD. Zostavax UTD. RX for Shingrix sent to pharmacy. PSA checked by urology. Colon screening UTD. Encouraged him to consume a balanced diet and exercise regimen. Advised him to see an eye doctor and dentist annually. Will check CBC, CMET, and Lipid profile today.   Next appointment: 1 year, Medicare Wellness Exam   Webb Silversmith, NP This visit occurred during the SARS-CoV-2 public health emergency.  Safety protocols were in place, including screening questions prior to the visit, additional usage of staff PPE, and extensive cleaning of exam room while observing appropriate contact time as indicated for disinfecting solutions.

## 2020-12-19 NOTE — Assessment & Plan Note (Signed)
He will continue to follow with urology. 

## 2020-12-19 NOTE — Patient Instructions (Signed)

## 2020-12-19 NOTE — Assessment & Plan Note (Signed)
Will monitor

## 2020-12-20 NOTE — Addendum Note (Signed)
Addended by: Roena Malady on: 12/20/2020 05:26 PM   Modules accepted: Orders

## 2020-12-21 ENCOUNTER — Encounter: Payer: Self-pay | Admitting: Internal Medicine

## 2020-12-21 MED ORDER — ZOSTER VAC RECOMB ADJUVANTED 50 MCG/0.5ML IM SUSR: 0.5000 mL | Freq: Once | INTRAMUSCULAR | 1 refills | Status: AC

## 2020-12-21 MED ORDER — EZETIMIBE 10 MG PO TABS: 10.0000 mg | ORAL_TABLET | Freq: Every day | ORAL | 2 refills | Status: DC

## 2020-12-23 ENCOUNTER — Encounter: Payer: Medicare PPO | Admitting: Internal Medicine

## 2020-12-23 ENCOUNTER — Encounter: Payer: Self-pay | Admitting: Urology

## 2020-12-23 ENCOUNTER — Other Ambulatory Visit: Payer: Self-pay

## 2020-12-23 DIAGNOSIS — N5201 Erectile dysfunction due to arterial insufficiency: Secondary | ICD-10-CM

## 2020-12-23 MED ORDER — TADALAFIL 20 MG PO TABS: 20.0000 mg | ORAL_TABLET | Freq: Every day | ORAL | 3 refills | Status: DC | PRN

## 2021-01-14 ENCOUNTER — Other Ambulatory Visit: Payer: Self-pay | Admitting: Internal Medicine

## 2021-02-03 ENCOUNTER — Telehealth: Payer: Self-pay

## 2021-02-03 NOTE — Telephone Encounter (Signed)
Pt had called saying he had been called, texted and emailed about an upcoming appointment on the 23rd but that we has rescheduled that due to Dr. Ruel Favors schedule. I looked and pt has an appointment for Feb. 23rd for lab work to be drawn before his next visit with doctor. Left message on home number and wifes mobile number.

## 2021-02-07 ENCOUNTER — Encounter: Payer: Self-pay | Admitting: Urology

## 2021-02-08 ENCOUNTER — Other Ambulatory Visit: Payer: Medicare PPO

## 2021-02-09 ENCOUNTER — Telehealth: Payer: Self-pay

## 2021-02-09 NOTE — Telephone Encounter (Signed)
Made aware

## 2021-02-16 ENCOUNTER — Other Ambulatory Visit: Payer: Self-pay | Admitting: Internal Medicine

## 2021-02-16 ENCOUNTER — Ambulatory Visit: Payer: Medicare PPO | Admitting: Urology

## 2021-02-24 ENCOUNTER — Other Ambulatory Visit: Payer: Self-pay

## 2021-02-24 ENCOUNTER — Telehealth: Payer: Self-pay | Admitting: Urology

## 2021-02-24 ENCOUNTER — Ambulatory Visit (INDEPENDENT_AMBULATORY_CARE_PROVIDER_SITE_OTHER): Payer: Medicare PPO | Admitting: Urology

## 2021-02-24 VITALS — BP 144/78 | HR 74 | Temp 98.9°F | Ht 71.0 in | Wt 199.0 lb

## 2021-02-24 DIAGNOSIS — R351 Nocturia: Secondary | ICD-10-CM

## 2021-02-24 DIAGNOSIS — N5201 Erectile dysfunction due to arterial insufficiency: Secondary | ICD-10-CM

## 2021-02-24 DIAGNOSIS — N401 Enlarged prostate with lower urinary tract symptoms: Secondary | ICD-10-CM | POA: Diagnosis not present

## 2021-02-24 DIAGNOSIS — N138 Other obstructive and reflux uropathy: Secondary | ICD-10-CM

## 2021-02-24 DIAGNOSIS — E291 Testicular hypofunction: Secondary | ICD-10-CM | POA: Diagnosis not present

## 2021-02-24 LAB — URINALYSIS, ROUTINE W REFLEX MICROSCOPIC
Bilirubin, UA: NEGATIVE
Glucose, UA: NEGATIVE
Ketones, UA: NEGATIVE
Leukocytes,UA: NEGATIVE
Nitrite, UA: NEGATIVE
Protein,UA: NEGATIVE
RBC, UA: NEGATIVE
Specific Gravity, UA: 1.02 (ref 1.005–1.030)
Urobilinogen, Ur: 0.2 mg/dL (ref 0.2–1.0)
pH, UA: 7 (ref 5.0–7.5)

## 2021-02-24 LAB — BLADDER SCAN AMB NON-IMAGING: Scan Result: 179

## 2021-02-24 MED ORDER — SILODOSIN 8 MG PO CAPS: 8.0000 mg | ORAL_CAPSULE | Freq: Every day | ORAL | 11 refills | Status: DC

## 2021-02-24 MED ORDER — TESTOSTERONE CYPIONATE 200 MG/ML IM SOLN: 100.0000 mg | INTRAMUSCULAR | 5 refills | Status: DC

## 2021-02-24 MED ORDER — "BD HYPODERMIC NEEDLE 18G X 1"" MISC": 10 refills | Status: DC

## 2021-02-24 MED ORDER — "BD LUER-LOK SYRINGE 23G X 1"" 3 ML MISC": 5 refills | Status: DC

## 2021-02-24 NOTE — Progress Notes (Signed)
Pt was notified syringes and needles sent in to pharmacy.

## 2021-02-24 NOTE — Telephone Encounter (Signed)
Pt notified that syringes and needles for Testosterone were sent in to pharmacy.

## 2021-02-24 NOTE — Progress Notes (Signed)
02/24/2021 10:50 AM   Harold Schwartz May 03, 1956 [id number]  Referring provider: Jearld Fenton, NP [address] [place],  [place] [address]  Followup hypogonadism and BPH  HPI: [name] is a 35UY here ofr followup for hypogonadism, BPH and erectile dysfunction. He injects 100mg  testosterone weekly. Good energy, good libido. ED resolved with testosterone replacement therapy. No recent testosterone labs. He has moderate LUTS on uroxatral 10mg  QHS. He has nocturia 3-4x, weak stream, urinary hesitancy and starting/stopping of his urinary stream. He is unhappy with his LUTS.    PMH: Past Medical History:  Diagnosis Date  . Atrial flutter ([place])   . Colon polyps   . Depression   . DVT (deep vein thrombosis) in pregnancy    last one in March 2019 taking eliquis  . Genital herpes   . Heart murmur   . Hemorrhoids   . Hyperlipidemia   . Hypogonadism in male   . Neuromuscular disorder ([place])    slight numbnes in right foot    Surgical History: Past Surgical History:  Procedure Laterality Date  . A-FLUTTER ABLATION N/A 03/22/2017   Procedure: A-Flutter Ablation;  Surgeon: Deboraha Sprang, MD;  Location: [facility];  Service: Cardiovascular;  Laterality: N/A;  . COLONOSCOPY WITH PROPOFOL N/A 05/16/2018   Procedure: COLONOSCOPY WITH PROPOFOL;  Surgeon: Lucilla Lame, MD;  Location: [facility];  Service: Endoscopy;  Laterality: N/A;  . KNEE ARTHROSCOPY    . POLYPECTOMY  05/16/2018   Procedure: POLYPECTOMY;  Surgeon: Lucilla Lame, MD;  Location: [facility];  Service: Endoscopy;;  . TENOTOMY ACHILLES TENDON Right 08/22/2018  . VASECTOMY  1990    Home Medications:  Allergies as of 02/24/2021   No Known Allergies     Medication List       Accurate as of February 24, 2021 10:50 AM. If you have any questions, ask your nurse or doctor.        alfuzosin 10 MG 24 hr tablet Commonly known as: UROXATRAL Take 1 tablet (10 mg total) by mouth at bedtime.   B-D  3CC LUER-LOK SYR 23GX1" 23G X 1" 3 ML Misc Generic drug: SYRINGE-NEEDLE (DISP) 3 ML USE AS DIRECTED BY PROVIDER   BD Hypodermic Needle 18G X 1" Misc Generic drug: NEEDLE (DISP) 18 G USE AS DIRECTED BY PROVIDER What changed: See the new instructions.   Celery Seed Oil Take 1 capsule by mouth in the morning and at bedtime.   cyclobenzaprine 10 MG tablet Commonly known as: FLEXERIL Take 1 tablet (10 mg total) by mouth 3 times/day as needed-between meals & bedtime for muscle spasms.   Eliquis 5 MG Tabs tablet Generic drug: apixaban TAKE 1 TABLET BY MOUTH TWICE A DAY   ezetimibe 10 MG tablet Commonly known as: ZETIA Take 1 tablet (10 mg total) by mouth daily.   Fish Oil 1200 MG Caps Take 2,400-3,600 capsules by mouth daily. Take 2,400 mg by mouth in the morning and 3,600 mg in the evening   glucosamine-chondroitin 500-400 MG tablet Take 1 tablet by mouth 2 (two) times daily.   Magnesium 300 MG Caps Take 300 mg by mouth at bedtime.   tadalafil 20 MG tablet Commonly known as: CIALIS Take 1 tablet (20 mg total) by mouth daily as needed for erectile dysfunction.   testosterone cypionate 200 MG/ML injection Commonly known as: DEPOTESTOSTERONE CYPIONATE INJECT 0.5 MILLILITER INTRAMUSCULARLY ONCE WEEKLY   valACYclovir 1000 MG tablet Commonly known as: VALTREX Take 1 tablet (1,000 mg  total) by mouth daily.   vitamin D3 50 MCG (2000 UT) Caps Take 6,000 Units by mouth.   vitamin E 180 MG (400 UNITS) capsule Take 400 Units by mouth daily.       Allergies: No Known Allergies  Family History: Family History  Problem Relation Age of Onset  . Breast cancer Mother   . Heart disease Mother   . Hyperlipidemia Mother   . Hypertension Mother   . Colon cancer Father   . Heart disease Father   . Alzheimer's disease Father   . Hyperlipidemia Father   . Hyperlipidemia Brother   . Multiple sclerosis Sister     Social History:  reports that he has never smoked. He has never  used smokeless tobacco. He reports current alcohol use of about 7.0 standard drinks of alcohol per week. He reports that he does not use drugs.  ROS: All other review of systems were reviewed and are negative except what is noted above in HPI  Physical Exam: BP (!) 144/78   Pulse 74   Temp 98.9 F (37.2 C)   Ht 5\' 11"  (1.803 m)   Wt 199 lb (90.3 kg)   BMI 27.75 kg/m   Constitutional:  Alert and oriented, No acute distress. HEENT: [place] AT, moist mucus membranes.  Trachea midline, no masses. Cardiovascular: No clubbing, cyanosis, or edema. Respiratory: Normal respiratory effort, no increased work of breathing. GI: Abdomen is soft, nontender, nondistended, no abdominal masses GU: No CVA tenderness.  Lymph: No cervical or inguinal lymphadenopathy. Skin: No rashes, bruises or suspicious lesions. Neurologic: Grossly intact, no focal deficits, moving all 4 extremities. Psychiatric: Normal mood and affect.  Laboratory Data: Lab Results  Component Value Date   WBC 5.1 12/19/2020   HGB 16.8 12/19/2020   HCT 49.5 12/19/2020   MCV 93.0 12/19/2020   PLT 142.0 (L) 12/19/2020    Lab Results  Component Value Date   CREATININE 1.31 12/19/2020    Lab Results  Component Value Date   PSA 3.61 10/21/2017   PSA 3.47 06/29/2016   PSA 2.34 04/18/2015    Lab Results  Component Value Date   TESTOSTERONE 129.20 (L) 03/18/2019    Lab Results  Component Value Date   HGBA1C 5.5 09/15/2019    Urinalysis    Component Value Date/Time   APPEARANCEUR Clear 08/17/2020 1454   GLUCOSEU Negative 08/17/2020 1454   BILIRUBINUR Negative 08/17/2020 1454   PROTEINUR Negative 08/17/2020 1454   NITRITE Negative 08/17/2020 1454   LEUKOCYTESUR Negative 08/17/2020 1454    Lab Results  Component Value Date   LABMICR Comment 08/17/2020    Pertinent Imaging:  No results found for this or any previous visit.  No results found for this or any previous visit.  No results found for this or any  previous visit.  No results found for this or any previous visit.  No results found for this or any previous visit.  No results found for this or any previous visit.  No results found for this or any previous visit.  No results found for this or any previous visit.   Assessment & Plan:    1. Male hypogonadism -continue IM testosterone 100mg  weekly -testosterone labs today -RTC 6 months with labs - Urinalysis, Routine w reflex microscopic  2. Benign prostatic hyperplasia with urinary obstruction -rapaflo 8mg  qhs - BLADDER SCAN AMB NON-IMAGING  3. Erectile dysfunction due to arterial insufficiency -resolved  4. Nocturia -rapaflo 8mg  qhs   No follow-ups on file.  Nicolette Bang, MD  [facility] [place]

## 2021-02-24 NOTE — Telephone Encounter (Signed)
Pt needs RX sent in for his syringes and needles for his testosterone injections.

## 2021-02-24 NOTE — Progress Notes (Signed)
Bladder Scan Patient can void: 179 ml Performed By: Durenda Guthrie, lpn   Urological Symptom Review  Patient is experiencing the following symptoms: Frequent urination Leakage of urine   Review of Systems  Gastrointestinal (upper)  : Negative for upper GI symptoms  Gastrointestinal (lower) : Negative for lower GI symptoms  Constitutional : Negative for symptoms  Skin: Negative for skin symptoms  Eyes: Negative for eye symptoms  Ear/Nose/Throat : Negative for Ear/Nose/Throat symptoms  Hematologic/Lymphatic: Negative for Hematologic/Lymphatic symptoms  Cardiovascular : Negative for cardiovascular symptoms  Respiratory : Negative for respiratory symptoms  Endocrine: Negative for endocrine symptoms  Musculoskeletal: Negative for musculoskeletal symptoms  Neurological: Negative for neurological symptoms  Psychologic: Negative for psychiatric symptoms

## 2021-02-27 ENCOUNTER — Encounter: Payer: Self-pay | Admitting: Urology

## 2021-02-27 LAB — COMPREHENSIVE METABOLIC PANEL
ALT: 17 IU/L (ref 0–44)
AST: 19 IU/L (ref 0–40)
Albumin/Globulin Ratio: 1.6 (ref 1.2–2.2)
Albumin: 4.2 g/dL (ref 3.8–4.8)
Alkaline Phosphatase: 41 IU/L — ABNORMAL LOW (ref 44–121)
BUN/Creatinine Ratio: 14 (ref 10–24)
BUN: 14 mg/dL (ref 8–27)
Bilirubin Total: 0.8 mg/dL (ref 0.0–1.2)
CO2: 22 mmol/L (ref 20–29)
Calcium: 9.3 mg/dL (ref 8.6–10.2)
Chloride: 101 mmol/L (ref 96–106)
Creatinine, Ser: 1.03 mg/dL (ref 0.76–1.27)
Globulin, Total: 2.7 g/dL (ref 1.5–4.5)
Glucose: 83 mg/dL (ref 65–99)
Potassium: 4.4 mmol/L (ref 3.5–5.2)
Sodium: 137 mmol/L (ref 134–144)
Total Protein: 6.9 g/dL (ref 6.0–8.5)
eGFR: 80 mL/min/{1.73_m2} (ref >59)

## 2021-02-27 LAB — CBC WITH DIFFERENTIAL
Basophils Absolute: 0 10*3/uL (ref 0.0–0.2)
Basos: 1 %
EOS (ABSOLUTE): 0.1 10*3/uL (ref 0.0–0.4)
Eos: 2 %
Hematocrit: 47.5 % (ref 37.5–51.0)
Hemoglobin: 16.3 g/dL (ref 13.0–17.7)
Immature Grans (Abs): 0 10*3/uL (ref 0.0–0.1)
Immature Granulocytes: 0 %
Lymphocytes Absolute: 1.4 10*3/uL (ref 0.7–3.1)
Lymphs: 35 %
MCH: 30.8 pg (ref 26.6–33.0)
MCHC: 34.3 g/dL (ref 31.5–35.7)
MCV: 90 fL (ref 79–97)
Monocytes Absolute: 0.4 10*3/uL (ref 0.1–0.9)
Monocytes: 9 %
Neutrophils Absolute: 2.2 10*3/uL (ref 1.4–7.0)
Neutrophils: 53 %
RBC: 5.3 x10E6/uL (ref 4.14–5.80)
RDW: 13.2 % (ref 11.6–15.4)
WBC: 4.1 10*3/uL (ref 3.4–10.8)

## 2021-02-27 LAB — TESTOSTERONE,FREE AND TOTAL
Testosterone, Free: 9.1 pg/mL (ref 6.6–18.1)
Testosterone: 631 ng/dL (ref 264–916)

## 2021-02-27 LAB — PSA: Prostate Specific Ag, Serum: 6.9 ng/mL — ABNORMAL HIGH (ref 0.0–4.0)

## 2021-02-28 ENCOUNTER — Telehealth: Payer: Self-pay | Admitting: *Deleted

## 2021-02-28 ENCOUNTER — Encounter: Payer: Self-pay | Admitting: Urology

## 2021-02-28 DIAGNOSIS — R972 Elevated prostate specific antigen [PSA]: Secondary | ICD-10-CM

## 2021-02-28 MED ORDER — LEVOFLOXACIN 750 MG PO TABS: 750.0000 mg | ORAL_TABLET | Freq: Every day | ORAL | 0 refills | Status: DC

## 2021-02-28 NOTE — Patient Instructions (Signed)
Testosterone injection What is this medicine? TESTOSTERONE (tes TOS ter one) is the main male hormone. It supports normal male development such as muscle growth, facial hair, and deep voice. It is used in males to treat low testosterone levels. This medicine may be used for other purposes; ask your health care provider or pharmacist if you have questions. COMMON BRAND NAME(S): Andro-L.A., Aveed, Delatestryl, Depo-Testosterone, Virilon What should I tell my health care provider before I take this medicine? They need to know if you have any of these conditions:  cancer  diabetes  heart disease  kidney disease  liver disease  lung disease  prostate disease  an unusual or allergic reaction to testosterone, other medicines, foods, dyes, or preservatives  pregnant or trying to get pregnant  breast-feeding How should I use this medicine? This medicine is for injection into a muscle. It is usually given by a health care professional in a hospital or clinic setting. Contact your pediatrician regarding the use of this medicine in children. While this medicine may be prescribed for children as young as 54 years of age for selected conditions, precautions do apply. Overdosage: If you think you have taken too much of this medicine contact a poison control center or emergency room at once. NOTE: This medicine is only for you. Do not share this medicine with others. What if I miss a dose? Try not to miss a dose. Your doctor or health care professional will tell you when your next injection is due. Notify the office if you are unable to keep an appointment. What may interact with this medicine?  medicines for diabetes  medicines that treat or prevent blood clots like warfarin  oxyphenbutazone  propranolol  steroid medicines like prednisone or cortisone This list may not describe all possible interactions. Give your health care provider a list of all the medicines, herbs, non-prescription  drugs, or dietary supplements you use. Also tell them if you smoke, drink alcohol, or use illegal drugs. Some items may interact with your medicine. What should I watch for while using this medicine? Visit your doctor or health care professional for regular checks on your progress. They will need to check the level of testosterone in your blood. This medicine is only approved for use in men who have low levels of testosterone related to certain medical conditions. Heart attacks and strokes have been reported with the use of this medicine. Notify your doctor or health care professional and seek emergency treatment if you develop breathing problems; changes in vision; confusion; chest pain or chest tightness; sudden arm pain; severe, sudden headache; trouble speaking or understanding; sudden numbness or weakness of the face, arm or leg; loss of balance or coordination. Talk to your doctor about the risks and benefits of this medicine. This medicine may affect blood sugar levels. If you have diabetes, check with your doctor or health care professional before you change your diet or the dose of your diabetic medicine. Testosterone injections are not commonly used in women. Women should inform their doctor if they wish to become pregnant or think they might be pregnant. There is a potential for serious side effects to an unborn child. Talk to your health care professional or pharmacist for more information. Talk with your doctor or health care professional about your birth control options while taking this medicine. This drug is banned from use in athletes by most athletic organizations. What side effects may I notice from receiving this medicine? Side effects that you should report to  your doctor or health care professional as soon as possible:  allergic reactions like skin rash, itching or hives, swelling of the face, lips, or tongue  breast enlargement  breathing problems  changes in emotions or  moods  deep or hoarse voice  irregular menstrual periods  signs and symptoms of liver injury like dark yellow or brown urine; general ill feeling or flu-like symptoms; light-colored stools; loss of appetite; nausea; right upper belly pain; unusually weak or tired; yellowing of the eyes or skin  stomach pain  swelling of the ankles, feet, hands  too frequent or persistent erections  trouble passing urine or change in the amount of urine Side effects that usually do not require medical attention (report to your doctor or health care professional if they continue or are bothersome):  acne  change in sex drive or performance  facial hair growth  hair loss  headache This list may not describe all possible side effects. Call your doctor for medical advice about side effects. You may report side effects to FDA at 1-800-FDA-1088. Where should I keep my medicine? Keep out of the reach of children. This medicine can be abused. Keep your medicine in a safe place to protect it from theft. Do not share this medicine with anyone. Selling or giving away this medicine is dangerous and against the law. Store at room temperature between 20 and 25 degrees C (68 and 77 degrees F). Do not freeze. Protect from light. Follow the directions for the product you are prescribed. Throw away any unused medicine after the expiration date. NOTE: This sheet is a summary. It may not cover all possible information. If you have questions about this medicine, talk to your doctor, pharmacist, or health care provider.  2021 Elsevier/Gold Standard (2016-01-07 07:33:55)

## 2021-02-28 NOTE — Telephone Encounter (Signed)
This patient hasn't been seen by cardiology since 2018. Notes indicate his Eliquis was prescribed by Dr Beryle Beams for a DVT.     Primary Cardiologist: Dr Caryl Comes  Chart reviewed as part of pre-operative protocol coverage. Because of Loic Hobin past medical history and time since last visit, he will require a follow-up visit in order to better assess preoperative cardiovascular risk.  Pre-op covering staff: - Please schedule appointment - with Dr Caryl Comes or an APP in the next few days- his procedure is 3/30 and call patient to inform them. If patient already had an upcoming appointment within acceptable timeframe, please add "pre-op clearance" to the appointment notes so provider is aware. - Please contact requesting surgeon's office via preferred method (i.e, phone, fax) to inform them of need for appointment prior to surgery.  If applicable, this message will also be routed to pharmacy pool and/or primary cardiologist for input on holding anticoagulant/antiplatelet agent as requested below so that this information is available to the clearing provider at time of patient's appointment.   Kerin Ransom, [name]  02/28/2021, 12:52 PM

## 2021-02-28 NOTE — Telephone Encounter (Signed)
Message has been sent to Allen, EP scheduler to please reach out to the pt with an appt for pre op clearance.

## 2021-02-28 NOTE — Telephone Encounter (Signed)
   [facility] Pre-operative Risk Assessment    HEARTCARE STAFF: - Please ensure there is not already an duplicate clearance open for this procedure. - Under Visit Info/Reason for Call, type in Other and utilize the format Clearance MM/DD/YY or Clearance TBD. Do not use dashes or single digits. - If request is for dental extraction, please clarify the # of teeth to be extracted.  Request for surgical clearance:  1. What type of surgery is being performed? PROSTATE Bx    2. When is this surgery scheduled? 03/15/21   3. What type of clearance is required (medical clearance vs. Pharmacy clearance to hold med vs. Both)? BOTH  4. Are there any medications that need to be held prior to surgery and how long? ELIQUIS; CLEARANCE NOTE JUST SAY HOLD 7 DAYS BEFORE THOUGH DOES NOT STATE WHICH MEDICATION. PLEASE SEE UNDER LETTERS INSTRUCTIONS FOR PROCEDURE GIVEN TO THE PT; LETTER DOES LIST TYPES OF BLOOD THINNERS ETC THOUGH DOES NOT LISTED SPECIFICALLY ELIQUIS.    5. Practice name and name of physician performing surgery? [facility]; DR. DR. Nicolette Bang   6. What is the office phone number? [phone]   7.   What is the office fax number? [phone]  8.   Anesthesia type (None, local, MAC, general) ? LOCAL   Julaine Hua 02/28/2021, 12:28 PM  _________________________________________________________________   (provider comments below)

## 2021-02-28 NOTE — Progress Notes (Signed)
Cardiology clearance.

## 2021-02-28 NOTE — Telephone Encounter (Signed)
Pt has appt 03/02/21 with Oda Kilts, [facility] for pre op assessment. I will forward notes to PA for upcoming appt. I will send FYI to requesting office pt has appt 03/02/21.

## 2021-03-01 NOTE — Progress Notes (Unsigned)
PCP:  Jearld Fenton, NP Primary Cardiologist: No primary care provider on file. Electrophysiologist: Dr. Caryl Comes (Last seen 04/2017)  Harold Schwartz is a 65 y.o. male seen today for Dr. Caryl Comes for cardiac clearance and to RE-ESTABLISH with cardiology care, it has been >3 years since his last visit. .  Since last being seen in our clinic the patient reports doing very well. He does not feel like he has had any breakthrough flutter, and denies palpitations or tachypalpitations. he denies chest pain, dyspnea, PND, orthopnea, nausea, vomiting, dizziness, syncope, edema, weight gain, or early satiety.  Past Medical History:  Diagnosis Date  . Atrial flutter ([place])   . Colon polyps   . Depression   . DVT (deep vein thrombosis) in pregnancy    last one in March 2019 taking eliquis  . Genital herpes   . Heart murmur   . Hemorrhoids   . Hyperlipidemia   . Hypogonadism in male   . Neuromuscular disorder ([place])    slight numbnes in right foot   Past Surgical History:  Procedure Laterality Date  . A-FLUTTER ABLATION N/A 03/22/2017   Procedure: A-Flutter Ablation;  Surgeon: Deboraha Sprang, MD;  Location: [facility];  Service: Cardiovascular;  Laterality: N/A;  . COLONOSCOPY WITH PROPOFOL N/A 05/16/2018   Procedure: COLONOSCOPY WITH PROPOFOL;  Surgeon: Lucilla Lame, MD;  Location: [facility];  Service: Endoscopy;  Laterality: N/A;  . KNEE ARTHROSCOPY    . POLYPECTOMY  05/16/2018   Procedure: POLYPECTOMY;  Surgeon: Lucilla Lame, MD;  Location: [facility];  Service: Endoscopy;;  . TENOTOMY ACHILLES TENDON Right 08/22/2018  . VASECTOMY  1990    Current Outpatient Medications  Medication Sig Dispense Refill  . Celery Seed OIL Take 1 capsule by mouth in the morning and at bedtime.     . Cholecalciferol (VITAMIN D3) 50 MCG (2000 UT) CAPS Take 6,000 Units by mouth.    . cyclobenzaprine (FLEXERIL) 10 MG tablet Take 1 tablet (10 mg total) by mouth 3 times/day as needed-between  meals & bedtime for muscle spasms. 30 tablet 0  . ELIQUIS 5 MG TABS tablet TAKE 1 TABLET BY MOUTH TWICE A DAY 180 tablet 1  . ezetimibe (ZETIA) 10 MG tablet Take 1 tablet (10 mg total) by mouth daily. 90 tablet 2  . glucosamine-chondroitin 500-400 MG tablet Take 1 tablet by mouth 2 (two) times daily.    Marland Kitchen levofloxacin (LEVAQUIN) 750 MG tablet Take 1 tablet (750 mg total) by mouth daily. Take the morning of procedure on 03/15/21 1 tablet 0  . Magnesium 300 MG CAPS Take 300 mg by mouth at bedtime.     Marland Kitchen NEEDLE, DISP, 18 G (BD HYPODERMIC NEEDLE) 18G X 1" MISC USE AS DIRECTED BY PROVIDER 12 each 10  . Omega-3 Fatty Acids (FISH OIL) 1200 MG CAPS Take 2,400-3,600 capsules by mouth daily. Take 2,400 mg by mouth in the morning and 3,600 mg in the evening    . silodosin (RAPAFLO) 8 MG CAPS capsule Take 1 capsule (8 mg total) by mouth daily with breakfast. 30 capsule 11  . SYRINGE-NEEDLE, DISP, 3 ML (B-D 3CC LUER-LOK SYR 23GX1") 23G X 1" 3 ML MISC USE AS DIRECTED BY PROVIDER 12 each 5  . tadalafil (CIALIS) 20 MG tablet Take 1 tablet (20 mg total) by mouth daily as needed for erectile dysfunction. 30 tablet 3  . testosterone cypionate (DEPOTESTOSTERONE CYPIONATE) 200 MG/ML injection Inject 0.5 mLs (100 mg total) into the muscle once a  week. 6 mL 5  . valACYclovir (VALTREX) 1000 MG tablet Take 1 tablet (1,000 mg total) by mouth daily. 90 tablet 2  . vitamin E 180 MG (400 UNITS) capsule Take 400 Units by mouth daily.     No current facility-administered medications for this visit.    No Known Allergies  Social History   Socioeconomic History  . Marital status: Married    Spouse name: Not on file  . Number of children: 2  . Years of education: Not on file  . Highest education level: Not on file  Occupational History  . Occupation: Engineer, site  Tobacco Use  . Smoking status: Never Smoker  . Smokeless tobacco: Never Used  Substance and Sexual Activity  . Alcohol use: Yes    Alcohol/week: 7.0  standard drinks    Types: 7 Shots of liquor per week    Comment: moderate  . Drug use: No  . Sexual activity: Not on file  Other Topics Concern  . Not on file  Social History Narrative  . Not on file   Social Determinants of Health   Financial Resource Strain: Not on file  Food Insecurity: Not on file  Transportation Needs: Not on file  Physical Activity: Not on file  Stress: Not on file  Social Connections: Not on file  Intimate Partner Violence: Not on file     Review of Systems: General: No chills, fever, night sweats or weight changes  Cardiovascular:  No chest pain, dyspnea on exertion, edema, orthopnea, palpitations, paroxysmal nocturnal dyspnea Dermatological: No rash, lesions or masses Respiratory: No cough, dyspnea Urologic: No hematuria, dysuria Abdominal: No nausea, vomiting, diarrhea, bright red blood per rectum, melena, or hematemesis Neurologic: No visual changes, weakness, changes in mental status All other systems reviewed and are otherwise negative except as noted above.  Physical Exam: Vitals:   03/02/21 0803  BP: 130/84  Pulse: 66  SpO2: 96%  Weight: 205 lb (93 kg)  Height: 5\' 11"  (1.803 m)    GEN- The patient is well appearing, alert and oriented x 3 today.   HEENT: normocephalic, atraumatic; sclera clear, conjunctiva pink; hearing intact; oropharynx clear; neck supple, no JVP Lymph- no cervical lymphadenopathy Lungs- Clear to ausculation bilaterally, normal work of breathing.  No wheezes, rales, rhonchi Heart- Regular rate and rhythm, no murmurs, rubs or gallops, PMI not laterally displaced GI- soft, non-tender, non-distended, bowel sounds present, no hepatosplenomegaly Extremities- no clubbing, cyanosis, or edema; DP/PT/radial pulses 2+ bilaterally MS- no significant deformity or atrophy Skin- warm and dry, no rash or lesion Psych- euthymic mood, full affect Neuro- strength and sensation are intact  EKG is ordered. Personal review of EKG  from today shows NSR at 66 bpm  Additional studies reviewed include: Previous EP notes (Last seen 2018)  Assessment and Plan:  1. Cardiac Clearance for Prostate Biopsy Echo 07/2016 LVEF 60-65% He is on eliquis for history of DVT, most recently 2019 We would not recommend holding Eliquis for 7 days, and typically only recommend holding AT MOST 48 hrs prior to procedure (4 doses), preferably 24 hours, and resume "as soon as possible" post procedure with h/o provoked AND unprovoked DVT. I will defer to his PCP who manages his chronic DVT, as his Eliquis is not managed by cardiology.  He is at a low risk of perioperative complications from a cardiac perspective by the Revised Cardiac Risk Index [place] Criteria)  2. A flutter s/p ablation 03/2017 Without recurrence Eliquis discontinued post ablation, but subsequently restarted due  to DVTs as above.  3. Chronic DVT on Eliquis Per PCP. As above we would not recommend holding Eliquis for 7 days prior.    Shirley Friar, [name]  03/02/21 8:18 AM

## 2021-03-02 ENCOUNTER — Encounter: Payer: Self-pay | Admitting: Internal Medicine

## 2021-03-02 ENCOUNTER — Encounter: Payer: Self-pay | Admitting: Student

## 2021-03-02 ENCOUNTER — Ambulatory Visit: Payer: Medicare PPO | Admitting: Student

## 2021-03-02 ENCOUNTER — Other Ambulatory Visit: Payer: Self-pay

## 2021-03-02 VITALS — BP 130/84 | HR 66 | Ht 71.0 in | Wt 205.0 lb

## 2021-03-02 DIAGNOSIS — I825Y2 Chronic embolism and thrombosis of unspecified deep veins of left proximal lower extremity: Secondary | ICD-10-CM

## 2021-03-02 DIAGNOSIS — I4892 Unspecified atrial flutter: Secondary | ICD-10-CM

## 2021-03-02 NOTE — Patient Instructions (Signed)
Medication Instructions:  Your physician recommends that you continue on your current medications as directed. Please refer to the Current Medication list given to you today.  *If you need a refill on your cardiac medications before your next appointment, please call your pharmacy*   Lab Work: None If you have labs (blood work) drawn today and your tests are completely normal, you will receive your results only by: MyChart Message (if you have MyChart) OR A paper copy in the mail If you have any lab test that is abnormal or we need to change your treatment, we will call you to review the results.   Follow-Up: At [facility], you and your health needs are our priority.  As part of our continuing mission to provide you with exceptional heart care, we have created designated Provider Care Teams.  These Care Teams include your primary Cardiologist (physician) and Advanced Practice Providers (APPs -  Physician Assistants and Nurse Practitioners) who all work together to provide you with the care you need, when you need it.   Your next appointment:   Follow up as needed 

## 2021-03-08 ENCOUNTER — Telehealth: Payer: Self-pay

## 2021-03-08 NOTE — Telephone Encounter (Signed)
Spoke with Lindell Spar NP medical assistant and she stated that it was ok for patient to be off eliquis 48 hours prior to biopsy and 48 hours after. Supporting documentation requested to be faxed to office. Patient called and made aware.

## 2021-03-11 ENCOUNTER — Encounter: Payer: Self-pay | Admitting: Urology

## 2021-03-15 ENCOUNTER — Other Ambulatory Visit: Payer: Self-pay

## 2021-03-15 ENCOUNTER — Encounter: Payer: Self-pay | Admitting: Urology

## 2021-03-15 ENCOUNTER — Other Ambulatory Visit: Payer: Self-pay | Admitting: Urology

## 2021-03-15 ENCOUNTER — Ambulatory Visit (HOSPITAL_COMMUNITY)
Admission: RE | Admit: 2021-03-15 | Discharge: 2021-03-15 | Disposition: A | Discharge status: Home / Self Care | Payer: Medicare PPO | Source: Ambulatory Visit | Attending: Urology | Admitting: Urology

## 2021-03-15 ENCOUNTER — Encounter (HOSPITAL_COMMUNITY): Payer: Self-pay

## 2021-03-15 ENCOUNTER — Ambulatory Visit (INDEPENDENT_AMBULATORY_CARE_PROVIDER_SITE_OTHER): Payer: Medicare PPO | Admitting: Urology

## 2021-03-15 DIAGNOSIS — R972 Elevated prostate specific antigen [PSA]: Secondary | ICD-10-CM | POA: Diagnosis present

## 2021-03-15 MED ORDER — LIDOCAINE HCL (PF) 2 % IJ SOLN: 10.0000 mL | Freq: Once | INTRAMUSCULAR | Status: AC

## 2021-03-15 MED ORDER — GENTAMICIN SULFATE 40 MG/ML IJ SOLN
INTRAMUSCULAR | Status: AC
  Administered 2021-03-15: 80 mg via INTRAMUSCULAR
  Filled 2021-03-15: qty 2

## 2021-03-15 MED ORDER — GENTAMICIN SULFATE 40 MG/ML IJ SOLN: 80.0000 mg | Freq: Once | INTRAMUSCULAR | Status: AC

## 2021-03-15 MED ORDER — LIDOCAINE HCL (PF) 2 % IJ SOLN
INTRAMUSCULAR | Status: AC
  Administered 2021-03-15: 10 mL
  Filled 2021-03-15: qty 10

## 2021-03-15 NOTE — Progress Notes (Signed)
Prostate Biopsy Procedure   Informed consent was obtained after discussing risks/benefits of the procedure.  A time out was performed to ensure correct patient identity.  Pre-Procedure: - Last PSA Level:  Lab Results  Component Value Date   PSA 3.61 10/21/2017   PSA 3.47 06/29/2016   PSA 2.34 04/18/2015   - Gentamicin given prophylactically - Levaquin 750 mg administered PO -Transrectal Ultrasound performed revealing a 141.6 gm prostate -No significant hypoechoic or median lobe noted  Procedure: - Prostate block performed using 10 cc 1% lidocaine and biopsies taken from sextant areas, a total of 12 under ultrasound guidance.  Post-Procedure: - Patient tolerated the procedure well - He was counseled to seek immediate medical attention if experiences any severe pain, significant bleeding, or fevers - Return in one week to discuss biopsy results

## 2021-03-15 NOTE — Sedation Documentation (Signed)
PT tolerated prostate biopsy procedure and IM antibiotic injection well today. Labs obtained and sent for pathology. PT ambulatory at discharge with no acute distress noted and verbalized understanding of discharge instructions. PT to follow up with urologist as scheduled on 03/24/2021.

## 2021-03-15 NOTE — Discharge Instructions (Signed)

## 2021-03-15 NOTE — Patient Instructions (Signed)

## 2021-03-17 ENCOUNTER — Encounter: Payer: Self-pay | Admitting: Internal Medicine

## 2021-03-18 ENCOUNTER — Encounter: Payer: Self-pay | Admitting: Urology

## 2021-03-20 ENCOUNTER — Telehealth: Payer: Self-pay

## 2021-03-20 NOTE — Telephone Encounter (Signed)
Pt had called Triage (4/2). Saying he was having blood clots upon urination. On call Doctor (Dr. Diona Fanti) was called and instructions were given. Tried to return call to patient. Left phone and Mychart message.

## 2021-03-20 NOTE — Telephone Encounter (Signed)
I called pt back to see how he was after calling triage this weekend. Pt called back and left message saying he was doing better at the time with clear urine and no clots in few hours. Not retaking his med yet. Wanted to wait a while longer. Then little later sent mychart message saying the blood and clots started again. I sent message to Dr. Alyson Ingles.  Called pt and notified message sent.

## 2021-03-24 ENCOUNTER — Encounter: Payer: Self-pay | Admitting: Internal Medicine

## 2021-03-24 ENCOUNTER — Other Ambulatory Visit: Payer: Self-pay

## 2021-03-24 ENCOUNTER — Ambulatory Visit (INDEPENDENT_AMBULATORY_CARE_PROVIDER_SITE_OTHER): Payer: Medicare PPO | Admitting: Urology

## 2021-03-24 ENCOUNTER — Encounter: Payer: Self-pay | Admitting: Urology

## 2021-03-24 VITALS — BP 118/76 | HR 69 | Temp 98.7°F | Ht 71.0 in | Wt 192.0 lb

## 2021-03-24 DIAGNOSIS — R972 Elevated prostate specific antigen [PSA]: Secondary | ICD-10-CM | POA: Diagnosis not present

## 2021-03-24 MED ORDER — LEVOFLOXACIN 750 MG PO TABS: 750.0000 mg | ORAL_TABLET | Freq: Every day | ORAL | 0 refills | Status: DC

## 2021-03-24 NOTE — Progress Notes (Signed)
Faxed to office

## 2021-03-24 NOTE — Progress Notes (Signed)
03/24/2021 3:14 PM   Harold Schwartz Mar 06, 1956 [id number]  Referring provider: Jearld Fenton, NP [address] [place],  [place] [address]  followup prostate biopsy  HPI: Harold Schwartz is a 84IO here for followup after prostate biopsy. Biopsy revealed 1/12 cores atypia. No other issues today. Hematuria resolved. No fevers. He has restarted his eliquis.    PMH: Past Medical History:  Diagnosis Date  . Atrial flutter ([place])   . Colon polyps   . Depression   . DVT (deep vein thrombosis) in pregnancy    last one in March 2019 taking eliquis  . Genital herpes   . Heart murmur   . Hemorrhoids   . Hyperlipidemia   . Hypogonadism in male   . Neuromuscular disorder ([place])    slight numbnes in right foot    Surgical History: Past Surgical History:  Procedure Laterality Date  . A-FLUTTER ABLATION N/A 03/22/2017   Procedure: A-Flutter Ablation;  Surgeon: Harold Sprang, MD;  Location: [facility];  Service: Cardiovascular;  Laterality: N/A;  . COLONOSCOPY WITH PROPOFOL N/A 05/16/2018   Procedure: COLONOSCOPY WITH PROPOFOL;  Surgeon: Harold Lame, MD;  Location: [facility];  Service: Endoscopy;  Laterality: N/A;  . KNEE ARTHROSCOPY    . POLYPECTOMY  05/16/2018   Procedure: POLYPECTOMY;  Surgeon: Harold Lame, MD;  Location: [facility];  Service: Endoscopy;;  . TENOTOMY ACHILLES TENDON Right 08/22/2018  . VASECTOMY  1990    Home Medications:  Allergies as of 03/24/2021   No Known Allergies     Medication List       Accurate as of March 24, 2021  3:14 PM. If you have any questions, ask your nurse or doctor.        B-D 3CC LUER-LOK SYR 23GX1" 23G X 1" 3 ML Misc Generic drug: SYRINGE-NEEDLE (DISP) 3 ML USE AS DIRECTED BY PROVIDER   BD Hypodermic Needle 18G X 1" Misc Generic drug: NEEDLE (DISP) 18 G USE AS DIRECTED BY PROVIDER   Celery Seed Oil Take 1 capsule by mouth in the morning and at bedtime.   cyclobenzaprine 10 MG tablet Commonly known  as: FLEXERIL Take 1 tablet (10 mg total) by mouth 3 times/day as needed-between meals & bedtime for muscle spasms.   Eliquis 5 MG Tabs tablet Generic drug: apixaban TAKE 1 TABLET BY MOUTH TWICE A DAY   ezetimibe 10 MG tablet Commonly known as: ZETIA Take 1 tablet (10 mg total) by mouth daily.   Fish Oil 1200 MG Caps Take 2,400-3,600 capsules by mouth daily. Take 2,400 mg by mouth in the morning and 3,600 mg in the evening   glucosamine-chondroitin 500-400 MG tablet Take 1 tablet by mouth 2 (two) times daily.   levofloxacin 750 MG tablet Commonly known as: Levaquin Take 1 tablet (750 mg total) by mouth daily. Take the morning of procedure on 03/15/21   Magnesium 300 MG Caps Take 300 mg by mouth at bedtime.   silodosin 8 MG Caps capsule Commonly known as: RAPAFLO Take 1 capsule (8 mg total) by mouth daily with breakfast.   tadalafil 20 MG tablet Commonly known as: CIALIS Take 1 tablet (20 mg total) by mouth daily as needed for erectile dysfunction.   testosterone cypionate 200 MG/ML injection Commonly known as: DEPOTESTOSTERONE CYPIONATE Inject 0.5 mLs (100 mg total) into the muscle once a week.   valACYclovir 1000 MG tablet Commonly known as: VALTREX Take 1 tablet (1,000 mg total) by mouth daily.  vitamin D3 50 MCG (2000 UT) Caps Take 6,000 Units by mouth.   vitamin E 180 MG (400 UNITS) capsule Take 400 Units by mouth daily.       Allergies: No Known Allergies  Family History: Family History  Problem Relation Age of Onset  . Breast cancer Mother   . Heart disease Mother   . Hyperlipidemia Mother   . Hypertension Mother   . Colon cancer Father   . Heart disease Father   . Alzheimer's disease Father   . Hyperlipidemia Father   . Hyperlipidemia Brother   . Multiple sclerosis Sister     Social History:  reports that he has never smoked. He has never used smokeless tobacco. He reports current alcohol use of about 7.0 standard drinks of alcohol per week. He  reports that he does not use drugs.  ROS: All other review of systems were reviewed and are negative except what is noted above in HPI  Physical Exam: BP 118/76   Pulse 69   Temp 98.7 F (37.1 C)   Ht 5\' 11"  (1.803 m)   Wt 192 lb (87.1 kg)   BMI 26.78 kg/m   Constitutional:  Alert and oriented, No acute distress. HEENT: [place] AT, moist mucus membranes.  Trachea midline, no masses. Cardiovascular: No clubbing, cyanosis, or edema. Respiratory: Normal respiratory effort, no increased work of breathing. GI: Abdomen is soft, nontender, nondistended, no abdominal masses GU: No CVA tenderness.  Lymph: No cervical or inguinal lymphadenopathy. Skin: No rashes, bruises or suspicious lesions. Neurologic: Grossly intact, no focal deficits, moving all 4 extremities. Psychiatric: Normal mood and affect.  Laboratory Data: Lab Results  Component Value Date   WBC 4.1 02/24/2021   HGB 16.3 02/24/2021   HCT 47.5 02/24/2021   MCV 90 02/24/2021   PLT 142.0 (L) 12/19/2020    Lab Results  Component Value Date   CREATININE 1.03 02/24/2021    Lab Results  Component Value Date   PSA 3.61 10/21/2017   PSA 3.47 06/29/2016   PSA 2.34 04/18/2015    Lab Results  Component Value Date   TESTOSTERONE 631 02/24/2021    Lab Results  Component Value Date   HGBA1C 5.5 09/15/2019    Urinalysis    Component Value Date/Time   APPEARANCEUR Clear 02/24/2021 1039   GLUCOSEU Negative 02/24/2021 1039   BILIRUBINUR Negative 02/24/2021 1039   PROTEINUR Negative 02/24/2021 1039   NITRITE Negative 02/24/2021 1039   LEUKOCYTESUR Negative 02/24/2021 1039    Lab Results  Component Value Date   LABMICR Comment 02/24/2021    Pertinent Imaging:  No results found for this or any previous visit.  No results found for this or any previous visit.  No results found for this or any previous visit.  No results found for this or any previous visit.  No results found for this or any previous  visit.  No results found for this or any previous visit.  No results found for this or any previous visit.  No results found for this or any previous visit.   Assessment & Plan:    1. Elevated PSA -RTC 3 months with prostate biopsy.    Return in about 3 months (around 06/23/2021) for prostate biopsy.  Harold Bang, MD  [facility] [place]

## 2021-03-24 NOTE — Addendum Note (Signed)
Addended byIris Pert on: 03/24/2021 03:34 PM   Modules accepted: Orders

## 2021-03-24 NOTE — Patient Instructions (Addendum)
Prostate-Specific Antigen Test Why am I having this test? The prostate-specific antigen (PSA) test is a screening test for prostate cancer. It can identify early signs of prostate cancer, which may allow for more effective treatment. Your health care provider may recommend that you have a PSA test starting at age 66 or that you have one earlier or later, depending on your risk factors for prostate cancer. You may also have a PSA test:  To monitor treatment of prostate cancer.  To check whether prostate cancer has returned after treatment.  If you have signs of other conditions that can affect PSA levels, such as: ? An enlarged prostate that is not caused by cancer (benign prostatic hyperplasia, BPH). This condition is very common in older men. ? A prostate infection. What is being tested? This test measures the amount of PSA in your blood. PSA is a protein that is made in the prostate. The prostate naturally produces more PSA as you age, but very high levels may be a sign of a medical condition. What kind of sample is taken? A blood sample is required for this test. It is usually collected by inserting a needle into a blood vessel or by sticking a finger with a small needle. Blood for this test should be drawn before having an exam of the prostate.   How do I prepare for this test? Do not ejaculate starting 24 hours before your test, or as long as told by your health care provider. Tell a health care provider about:  Any allergies you have.  All medicines you are taking, including vitamins, herbs, eye drops, creams, and over-the-counter medicines. This also includes: ? Medicines to assist with hair growth, such as finasteride. ? Any recent exposure to a medicine called diethylstilbestrol.  Any blood disorders you have.  Any recent procedures you have had, especially any procedures involving the prostate or rectum.  Any medical conditions you have.  Any recent urinary tract infections  (UTIs) you have had. How are the results reported? Your test results will be reported as a value that indicates how much PSA is in your blood. This will be given as nanograms of PSA per milliliter of blood (ng/mL). Your health care provider will compare your results to normal ranges that were established after testing a large group of people (reference ranges). Reference ranges may vary among labs and hospitals. PSA levels vary from person to person and generally increase with age. Because of this variation, there is no single PSA value that is considered normal for everyone. Instead, PSA reference ranges are used to describe whether your PSA levels are considered low or high (elevated). Common reference ranges are:  Low: 0-2.5 ng/mL.  Slightly to moderately elevated: 2.6-10.0 ng/mL.  Moderately elevated: 10.0-19.9 ng/mL.  Significantly elevated: 20 ng/mL or greater. Sometimes, the test results may report that a condition is present when it is not present (false-positive result). What do the results mean? A test result that is higher than 4 ng/mL may mean that you are at an increased risk for prostate cancer. However, a PSA test by itself is not enough to diagnose prostate cancer. High PSA levels may also be caused by the natural aging process, prostate infection, or BPH. PSA screening cannot tell you if your PSA is high due to cancer or a different cause. A prostate biopsy is the only way to diagnose prostate cancer. A risk of having the PSA test is diagnosing and treating prostate cancer that would never have caused  any symptoms or problems (overdiagnosis and overtreatment). Talk with your health care provider about what your results mean. Questions to ask your health care provider Ask your health care provider, or the department that is doing the test:  When will my results be ready?  How will I get my results?  What are my treatment options?  What other tests do I need?  What are my  next steps? Summary  The prostate-specific antigen (PSA) test is a screening test for prostate cancer.  Your health care provider may recommend that you have a PSA test starting at age 40 or that you have one earlier or later, depending on your risk factors for prostate cancer.  A test result that is higher than 4 ng/mL may mean that you are at an increased risk for prostate cancer. However, elevated levels can be caused by a number of conditions other than prostate cancer.  Talk with your health care provider about what your results mean. This information is not intended to replace advice given to you by your health care provider. Make sure you discuss any questions you have with your health care provider. Document Revised: 08/18/2020 Document Reviewed: 08/18/2020 Elsevier Patient Education  2021 [place].       Appointment Time: 12:15  Please arrive by 12:00 noon Appointment Date: 06/28/21  Location: [facility]   Prostate Biopsy Instructions  Stop all aspirin or blood thinners (aspirin, plavix, coumadin, warfarin, motrin, ibuprofen, advil, aleve, naproxen, naprosyn) for 7 days prior to the procedure.  If you have any questions about stopping these medications, please contact your primary care physician or cardiologist.  Having a light meal prior to the procedure is recommended.  If you are diabetic or have low blood sugar please bring a small snack or glucose tablet.  A Fleets enema is needed to be purchased over the counter at a local pharmacy and used 2 hours before you scheduled appointment.  This can be purchased over the counter at any pharmacy.  Antibiotics will be administered in the clinic at the time of the procedure and 1 tablet has been sent to your pharmacy. Please take the antibiotic as prescribed.    Please bring someone with you to the procedure to drive you home if you are given a valium to take prior to your procedure.   If you have any  questions or concerns, please feel free to call the office at [phone] or send a Mychart message.    Thank you, [facility]

## 2021-06-20 ENCOUNTER — Encounter: Payer: Self-pay | Admitting: Urology

## 2021-06-27 ENCOUNTER — Other Ambulatory Visit: Payer: Self-pay | Admitting: Urology

## 2021-06-27 MED ORDER — DIAZEPAM 10 MG PO TABS: 10.0000 mg | ORAL_TABLET | Freq: Once | ORAL | 0 refills | Status: AC

## 2021-06-28 ENCOUNTER — Ambulatory Visit (HOSPITAL_COMMUNITY)
Admission: RE | Admit: 2021-06-28 | Discharge: 2021-06-28 | Disposition: A | Discharge status: Home / Self Care | Payer: Medicare PPO | Source: Ambulatory Visit | Attending: Urology | Admitting: Urology

## 2021-06-28 ENCOUNTER — Ambulatory Visit (INDEPENDENT_AMBULATORY_CARE_PROVIDER_SITE_OTHER): Payer: Medicare PPO | Admitting: Urology

## 2021-06-28 ENCOUNTER — Other Ambulatory Visit: Payer: Self-pay

## 2021-06-28 ENCOUNTER — Encounter (HOSPITAL_COMMUNITY): Payer: Self-pay

## 2021-06-28 ENCOUNTER — Other Ambulatory Visit: Payer: Self-pay | Admitting: Urology

## 2021-06-28 DIAGNOSIS — R972 Elevated prostate specific antigen [PSA]: Secondary | ICD-10-CM | POA: Diagnosis present

## 2021-06-28 MED ORDER — GENTAMICIN SULFATE 40 MG/ML IJ SOLN
INTRAMUSCULAR | Status: AC
  Administered 2021-06-28: 80 mg via INTRAMUSCULAR
  Filled 2021-06-28: qty 2

## 2021-06-28 MED ORDER — GENTAMICIN SULFATE 40 MG/ML IJ SOLN: 80.0000 mg | Freq: Once | INTRAMUSCULAR | Status: AC

## 2021-06-28 MED ORDER — LIDOCAINE HCL 2 % IJ SOLN
INTRAMUSCULAR | Status: AC
  Administered 2021-06-28: 200 mg
  Filled 2021-06-28: qty 10

## 2021-06-28 MED ORDER — LIDOCAINE HCL (PF) 2 % IJ SOLN: 10.0000 mL | Freq: Once | INTRAMUSCULAR | Status: DC

## 2021-06-28 NOTE — Sedation Documentation (Signed)
PT tolerated prostate biopsy procedure well today. Labs obtained and sent for pathology. PT ambulatory at discharge with no acute distress noted and verbalized understanding of discharge instructions. PT to follow up with urologist as scheduled on 07/05/21.

## 2021-07-05 ENCOUNTER — Encounter: Payer: Self-pay | Admitting: Urology

## 2021-07-05 ENCOUNTER — Ambulatory Visit: Payer: Medicare PPO | Admitting: Urology

## 2021-07-05 ENCOUNTER — Other Ambulatory Visit: Payer: Self-pay

## 2021-07-05 VITALS — BP 116/76 | HR 78 | Wt 193.8 lb

## 2021-07-05 DIAGNOSIS — R972 Elevated prostate specific antigen [PSA]: Secondary | ICD-10-CM | POA: Diagnosis not present

## 2021-07-05 DIAGNOSIS — N138 Other obstructive and reflux uropathy: Secondary | ICD-10-CM | POA: Diagnosis not present

## 2021-07-05 DIAGNOSIS — R351 Nocturia: Secondary | ICD-10-CM

## 2021-07-05 DIAGNOSIS — N401 Enlarged prostate with lower urinary tract symptoms: Secondary | ICD-10-CM | POA: Diagnosis not present

## 2021-07-05 MED ORDER — SILODOSIN 8 MG PO CAPS: 8.0000 mg | ORAL_CAPSULE | Freq: Every day | ORAL | 11 refills | Status: DC

## 2021-07-05 MED ORDER — FINASTERIDE 5 MG PO TABS: 5.0000 mg | ORAL_TABLET | Freq: Every day | ORAL | 3 refills | Status: DC

## 2021-07-05 NOTE — Patient Instructions (Signed)
Benign Prostatic Hyperplasia  Benign prostatic hyperplasia (BPH) is an enlarged prostate gland that is caused by the normal aging process and not by cancer. The prostate is a walnut-sized gland that is involved in the production of semen. It is located in front of the rectum and below the bladder. The bladder stores urine and the urethra is the tube that carries the urine out of the body. The prostate may get bigger asa man gets older. An enlarged prostate can press on the urethra. This can make it harder to pass urine. The build-up of urine in the bladder can cause infection. Back pressure and infection may progress to bladder damage and kidney (renal) failure. What are the causes? This condition is part of a normal aging process. However, not all men develop problems from this condition. If the prostate enlarges away from the urethra, urine flow will not be blocked. If it enlarges toward the urethra andcompresses it, there will be problems passing urine. What increases the risk? This condition is more likely to develop in men over the age of 50 years. What are the signs or symptoms? Symptoms of this condition include: Getting up often during the night to urinate. Needing to urinate frequently during the day. Difficulty starting urine flow. Decrease in size and strength of your urine stream. Leaking (dribbling) after urinating. Inability to pass urine. This needs immediate treatment. Inability to completely empty your bladder. Pain when you pass urine. This is more common if there is also an infection. Urinary tract infection (UTI). How is this diagnosed? This condition is diagnosed based on your medical history, a physical exam, and your symptoms. Tests will also be done, such as: A post-void bladder scan. This measures any amount of urine that may remain in your bladder after you finish urinating. A digital rectal exam. In a rectal exam, your health care provider checks your prostate by  putting a lubricated, gloved finger into your rectum to feel the back of your prostate gland. This exam detects the size of your gland and any abnormal lumps or growths. An exam of your urine (urinalysis). A prostate specific antigen (PSA) screening. This is a blood test used to screen for prostate cancer. An ultrasound. This test uses sound waves to electronically produce a picture of your prostate gland. Your health care provider may refer you to a specialist in kidney and prostate diseases (urologist). How is this treated? Once symptoms begin, your health care provider will monitor your condition (active surveillance or watchful waiting). Treatment for this condition will depend on the severity of your condition. Treatment may include: Observation and yearly exams. This may be the only treatment needed if your condition and symptoms are mild. Medicines to relieve your symptoms, including: Medicines to shrink the prostate. Medicines to relax the muscle of the prostate. Surgery in severe cases. Surgery may include: Prostatectomy. In this procedure, the prostate tissue is removed completely through an open incision or with a laparoscope or robotics. Transurethral resection of the prostate (TURP). In this procedure, a tool is inserted through the opening at the tip of the penis (urethra). It is used to cut away tissue of the inner core of the prostate. The pieces are removed through the same opening of the penis. This removes the blockage. Transurethral incision (TUIP). In this procedure, small cuts are made in the prostate. This lessens the prostate's pressure on the urethra. Transurethral microwave thermotherapy (TUMT). This procedure uses microwaves to create heat. The heat destroys and removes a small   amount of prostate tissue. Transurethral needle ablation (TUNA). This procedure uses radio frequencies to destroy and remove a small amount of prostate tissue. Interstitial laser coagulation (ILC).  This procedure uses a laser to destroy and remove a small amount of prostate tissue. Transurethral electrovaporization (TUVP). This procedure uses electrodes to destroy and remove a small amount of prostate tissue. Prostatic urethral lift. This procedure inserts an implant to push the lobes of the prostate away from the urethra. Follow these instructions at home: Take over-the-counter and prescription medicines only as told by your health care provider. Monitor your symptoms for any changes. Contact your health care provider with any changes. Avoid drinking large amounts of liquid before going to bed or out in public. Avoid or reduce how much caffeine or alcohol you drink. Give yourself time when you urinate. Keep all follow-up visits as told by your health care provider. This is important. Contact a health care provider if: You have unexplained back pain. Your symptoms do not get better with treatment. You develop side effects from the medicine you are taking. Your urine becomes very dark or has a bad smell. Your lower abdomen becomes distended and you have trouble passing your urine. Get help right away if: You have a fever or chills. You suddenly cannot urinate. You feel lightheaded, or very dizzy, or you faint. There are large amounts of blood or clots in the urine. Your urinary problems become hard to manage. You develop moderate to severe low back or flank pain. The flank is the side of your body between the ribs and the hip. These symptoms may represent a serious problem that is an emergency. Do not wait to see if the symptoms will go away. Get medical help right away. Call your local emergency services (911 in the [place] Do not drive yourself to the hospital. Summary Benign prostatic hyperplasia (BPH) is an enlarged prostate that is caused by the normal aging process and not by cancer. An enlarged prostate can press on the urethra. This can make it hard to pass urine. This  condition is part of a normal aging process and is more likely to develop in men over the age of 50 years. Get help right away if you suddenly cannot urinate. This information is not intended to replace advice given to you by your health care provider. Make sure you discuss any questions you have with your healthcare provider. Document Revised: 08/11/2020 Document Reviewed: 08/11/2020 Elsevier Patient Education  2022 [employer]  

## 2021-07-05 NOTE — Progress Notes (Signed)
07/05/2021 4:04 PM   [name] June 12, 1956 [id number]  Referring provider: Jearld Fenton, NP [address] [place],  [place] [address]  Chief Complaint  Patient presents with   Follow-up    Discuss issues    HPI: [name] is a 37BO here for followup after prostate biopsy. Pathology benign. He continues to have moderate LUTS on rapaflo 8mg  daily. IPSS 15 QOL 4. Urinary stream fair. Nocturia 3-4x. Occasional urinary hesitancy. He is unhappy with his urination.    PMH: Past Medical History:  Diagnosis Date   Atrial flutter ([place])    Colon polyps    Depression    DVT (deep vein thrombosis) in pregnancy    last one in March 2019 taking eliquis   Genital herpes    Heart murmur    Hemorrhoids    Hyperlipidemia    Hypogonadism in male    Neuromuscular disorder (HCC)    slight numbnes in right foot    Surgical History: Past Surgical History:  Procedure Laterality Date   A-FLUTTER ABLATION N/A 03/22/2017   Procedure: A-Flutter Ablation;  Surgeon: Deboraha Sprang, MD;  Location: [place] [facility];  Service: Cardiovascular;  Laterality: N/A;   COLONOSCOPY WITH PROPOFOL N/A 05/16/2018   Procedure: COLONOSCOPY WITH PROPOFOL;  Surgeon: Lucilla Lame, MD;  Location: [place];  Service: Endoscopy;  Laterality: N/A;   KNEE ARTHROSCOPY     POLYPECTOMY  05/16/2018   Procedure: POLYPECTOMY;  Surgeon: Lucilla Lame, MD;  Location: [place];  Service: Endoscopy;;   TENOTOMY ACHILLES TENDON Right 08/22/2018   VASECTOMY  1990    Home Medications:  Allergies as of 07/05/2021   No Known Allergies      Medication List        Accurate as of July 05, 2021  4:04 PM. If you have any questions, ask your nurse or doctor.          B-D 3CC LUER-LOK SYR 23GX1" 23G X 1" 3 ML Misc Generic drug: SYRINGE-NEEDLE (DISP) 3 ML USE AS DIRECTED BY PROVIDER   BD Hypodermic Needle 18G X 1" Misc Generic drug: NEEDLE (DISP) 18 G USE AS DIRECTED BY PROVIDER   Celery Seed Oil Take  1 capsule by mouth in the morning and at bedtime.   cyclobenzaprine 10 MG tablet Commonly known as: FLEXERIL Take 1 tablet (10 mg total) by mouth 3 times/day as needed-between meals & bedtime for muscle spasms.   Eliquis 5 MG Tabs tablet Generic drug: apixaban TAKE 1 TABLET BY MOUTH TWICE A DAY   ezetimibe 10 MG tablet Commonly known as: ZETIA Take 1 tablet (10 mg total) by mouth daily.   finasteride 5 MG tablet Commonly known as: PROSCAR Take 1 tablet (5 mg total) by mouth daily. Started by: Nicolette Bang, MD   Fish Oil 1200 MG Caps Take 2,400-3,600 capsules by mouth daily. Take 2,400 mg by mouth in the morning and 3,600 mg in the evening   glucosamine-chondroitin 500-400 MG tablet Take 1 tablet by mouth 2 (two) times daily.   levofloxacin 750 MG tablet Commonly known as: Levaquin Take 1 tablet (750 mg total) by mouth daily. Take the morning of procedure on 06/28/21   Magnesium 300 MG Caps Take 300 mg by mouth at bedtime.   silodosin 8 MG Caps capsule Commonly known as: RAPAFLO Take 1 capsule (8 mg total) by mouth daily with breakfast.   tadalafil 20 MG tablet Commonly known as: CIALIS Take 1 tablet (20 mg total) by  mouth daily as needed for erectile dysfunction.   testosterone cypionate 200 MG/ML injection Commonly known as: DEPOTESTOSTERONE CYPIONATE Inject 0.5 mLs (100 mg total) into the muscle once a week.   valACYclovir 1000 MG tablet Commonly known as: VALTREX Take 1 tablet (1,000 mg total) by mouth daily.   vitamin D3 50 MCG (2000 UT) Caps Take 6,000 Units by mouth.   vitamin E 180 MG (400 UNITS) capsule Take 400 Units by mouth daily.        Allergies: No Known Allergies  Family History: Family History  Problem Relation Age of Onset   Breast cancer Mother    Heart disease Mother    Hyperlipidemia Mother    Hypertension Mother    Colon cancer Father    Heart disease Father    Alzheimer's disease Father    Hyperlipidemia Father     Hyperlipidemia Brother    Multiple sclerosis Sister     Social History:  reports that he has never smoked. He has never used smokeless tobacco. He reports current alcohol use of about 7.0 standard drinks of alcohol per week. He reports that he does not use drugs.  ROS: All other review of systems were reviewed and are negative except what is noted above in HPI  Physical Exam: BP 116/76   Pulse 78   Wt 193 lb 12.8 oz (87.9 kg)   BMI 27.03 kg/m   Constitutional:  Alert and oriented, No acute distress. HEENT: [place] AT, moist mucus membranes.  Trachea midline, no masses. Cardiovascular: No clubbing, cyanosis, or edema. Respiratory: Normal respiratory effort, no increased work of breathing. GI: Abdomen is soft, nontender, nondistended, no abdominal masses GU: No CVA tenderness.  Lymph: No cervical or inguinal lymphadenopathy. Skin: No rashes, bruises or suspicious lesions. Neurologic: Grossly intact, no focal deficits, moving all 4 extremities. Psychiatric: Normal mood and affect.  Laboratory Data: Lab Results  Component Value Date   WBC 4.1 02/24/2021   HGB 16.3 02/24/2021   HCT 47.5 02/24/2021   MCV 90 02/24/2021   PLT 142.0 (L) 12/19/2020    Lab Results  Component Value Date   CREATININE 1.03 02/24/2021    Lab Results  Component Value Date   PSA 3.61 10/21/2017   PSA 3.47 06/29/2016   PSA 2.34 04/18/2015    Lab Results  Component Value Date   TESTOSTERONE 631 02/24/2021    Lab Results  Component Value Date   HGBA1C 5.5 09/15/2019    Urinalysis    Component Value Date/Time   APPEARANCEUR Clear 02/24/2021 1039   GLUCOSEU Negative 02/24/2021 1039   BILIRUBINUR Negative 02/24/2021 1039   PROTEINUR Negative 02/24/2021 1039   NITRITE Negative 02/24/2021 1039   LEUKOCYTESUR Negative 02/24/2021 1039    Lab Results  Component Value Date   LABMICR Comment 02/24/2021    Pertinent Imaging:  No results found for this or any previous visit.  No results found  for this or any previous visit.  No results found for this or any previous visit.  No results found for this or any previous visit.  No results found for this or any previous visit.  No results found for this or any previous visit.  No results found for this or any previous visit.  No results found for this or any previous visit.   Assessment & Plan:    1. Elevated PSA -RTC 6 months with PSA - PSA; Future  2. Nocturia Continue rapaflo, we will add finasteride 5mg  daily  3. Benign prostatic hyperplasia  with urinary obstruction -Continue rapaflo 8mg  daily and start finasteride 5mg  daily   Return in about 6 months (around 01/05/2022) for PSA.  Nicolette Bang, MD  [facility] [place]

## 2021-07-05 NOTE — Patient Instructions (Signed)
Transrectal Ultrasound-Guided Prostate Biopsy, Care After This sheet gives you information about how to care for yourself after your procedure. Your doctor may also give you more specific instructions. If youhave problems or questions, contact your doctor. What can I expect after the procedure? After the procedure, it is common to have: Pain and discomfort in your butt, especially while sitting. Pink-colored pee (urine), due to small amounts of blood in the pee. Burning while peeing (urinating). Blood in your poop (stool). Bleeding from your butt. Blood in your semen. Follow these instructions at home: Medicines Take over-the-counter and prescription medicines only as told by your doctor. If you were prescribed antibiotic medicine, take it as told by your doctor. Do not stop taking the antibiotic even if you start to feel better. Activity  Do not drive for 24 hours if you were given a medicine to help you relax (sedative) during your procedure. Return to your normal activities as told by your doctor. Ask your doctor what activities are safe for you. Ask your doctor when it is okay for you to have sex. Do not lift anything that is heavier than 10 lb (4.5 kg), or the limit that you are told, until your doctor says that it is safe.  General instructions  Drink enough water to keep your pee pale yellow. Watch your pee, poop, and semen for new bleeding or bleeding that gets worse. Keep all follow-up visits as told by your doctor. This is important.  Contact a doctor if you: Have blood clots in your pee or poop. Notice that your pee smells bad or unusual. Have very bad belly pain. Have trouble peeing. Notice that your lower belly feels firm. Have blood in your pee for more than 2 weeks after the procedure. Have blood in your semen for more than 2 months after the procedure. Have problems getting an erection. Feel sick to your stomach (nauseous) or throw up (vomit). Have new or worse  bleeding in your pee, poop, or semen. Get help right away if you: Have a fever or chills. Have bright red pee. Have very bad pain that does not get better with medicine. Cannot pee. Summary After this procedure, it is common to have pain and discomfort around your butt, especially while sitting. You may have blood in your pee and poop. It is common to have blood in your semen for 1-2 months. If you were prescribed antibiotic medicine, take it as told by your doctor. Do not stop taking the antibiotic even if you start to feel better. Get help right away if you have a fever or chills. This information is not intended to replace advice given to you by your health care provider. Make sure you discuss any questions you have with your healthcare provider. Document Revised: 10/17/2020 Document Reviewed: 08/18/2020 Elsevier Patient Education  2022 Elsevier Inc.  

## 2021-07-05 NOTE — Progress Notes (Signed)
Prostate Biopsy Procedure   Informed consent was obtained after discussing risks/benefits of the procedure.  A time out was performed to ensure correct patient identity.  Pre-Procedure: - Last PSA Level:  Lab Results  Component Value Date   PSA 3.61 10/21/2017   PSA 3.47 06/29/2016   PSA 2.34 04/18/2015   - Gentamicin given prophylactically - Levaquin 500 mg administered PO -Transrectal Ultrasound performed revealing a 112 gm prostate -No significant hypoechoic or median lobe noted  Procedure: - Prostate block performed using 10 cc 1% lidocaine and biopsies taken from sextant areas, a total of 12 under ultrasound guidance.  Post-Procedure: - Patient tolerated the procedure well - He was counseled to seek immediate medical attention if experiences any severe pain, significant bleeding, or fevers - Return in one week to discuss biopsy results

## 2021-07-07 ENCOUNTER — Other Ambulatory Visit: Payer: Self-pay | Admitting: Urology

## 2021-07-07 DIAGNOSIS — N5201 Erectile dysfunction due to arterial insufficiency: Secondary | ICD-10-CM

## 2021-07-13 ENCOUNTER — Other Ambulatory Visit: Payer: Self-pay | Admitting: Internal Medicine

## 2021-07-15 ENCOUNTER — Encounter: Payer: Self-pay | Admitting: Urology

## 2021-08-25 ENCOUNTER — Other Ambulatory Visit: Payer: Medicare PPO

## 2021-08-28 ENCOUNTER — Ambulatory Visit: Payer: Medicare PPO | Admitting: Urology

## 2021-09-01 ENCOUNTER — Ambulatory Visit: Payer: Medicare PPO | Admitting: Urology

## 2021-09-25 ENCOUNTER — Other Ambulatory Visit: Payer: Self-pay | Admitting: Internal Medicine

## 2021-09-25 ENCOUNTER — Other Ambulatory Visit: Payer: Self-pay | Admitting: Urology

## 2021-09-25 DIAGNOSIS — N5201 Erectile dysfunction due to arterial insufficiency: Secondary | ICD-10-CM

## 2021-10-03 ENCOUNTER — Ambulatory Visit: Payer: Medicare PPO | Admitting: Urology

## 2021-10-03 ENCOUNTER — Encounter: Payer: Self-pay | Admitting: Urology

## 2021-10-03 ENCOUNTER — Other Ambulatory Visit: Payer: Self-pay

## 2021-10-03 VITALS — BP 151/86 | HR 98

## 2021-10-03 DIAGNOSIS — N5089 Other specified disorders of the male genital organs: Secondary | ICD-10-CM | POA: Diagnosis not present

## 2021-10-03 MED ORDER — SULFAMETHOXAZOLE-TRIMETHOPRIM 800-160 MG PO TABS: 1.0000 | ORAL_TABLET | Freq: Two times a day (BID) | ORAL | 0 refills | Status: DC

## 2021-10-03 NOTE — Progress Notes (Signed)
10/03/2021 3:17 PM   Harold Schwartz 1956/01/30 [id number]  Referring provider: Jearld Fenton, NP [place],  [place] [address]  Right testis mass   HPI: Mr Speagle is a 39GQ here for evaluation of a  rigtht testis mass. . He has very moderate right testis pain which is dull to sharp, moderate, intermittent and nonraditing. It has ben present for 10 days and is worsening. He denies any trauma. He denies any worsening LUTS. No dysuria. No pelvic pain. No other associated symptoms. NO exacerbaiting/alleviating events i PMH: Past Medical History:  Diagnosis Date   Atrial flutter ([place])    Colon polyps    Depression    DVT (deep vein thrombosis) in pregnancy    last one in March 2019 taking eliquis   Genital herpes    Heart murmur    Hemorrhoids    Hyperlipidemia    Hypogonadism in male    Neuromuscular disorder (HCC)    slight numbnes in right foot    Surgical History: Past Surgical History:  Procedure Laterality Date   A-FLUTTER ABLATION N/A 03/22/2017   Procedure: A-Flutter Ablation;  Surgeon: Deboraha Sprang, MD;  Location: [place] [facility];  Service: Cardiovascular;  Laterality: N/A;   COLONOSCOPY WITH PROPOFOL N/A 05/16/2018   Procedure: COLONOSCOPY WITH PROPOFOL;  Surgeon: Lucilla Lame, MD;  Location: [place];  Service: Endoscopy;  Laterality: N/A;   KNEE ARTHROSCOPY     POLYPECTOMY  05/16/2018   Procedure: POLYPECTOMY;  Surgeon: Lucilla Lame, MD;  Location: [place];  Service: Endoscopy;;   TENOTOMY ACHILLES TENDON Right 08/22/2018   VASECTOMY  1990    Home Medications:  Allergies as of 10/03/2021   No Known Allergies      Medication List        Accurate as of October 03, 2021  3:17 PM. If you have any questions, ask your nurse or doctor.          STOP taking these medications    levofloxacin 750 MG tablet Commonly known as: Levaquin Stopped by: Nicolette Bang, MD       TAKE these medications    B-D 3CC LUER-LOK  SYR 23GX1" 23G X 1" 3 ML Misc Generic drug: SYRINGE-NEEDLE (DISP) 3 ML USE AS DIRECTED BY PROVIDER   BD Hypodermic Needle 18G X 1" Misc Generic drug: NEEDLE (DISP) 18 G USE AS DIRECTED BY PROVIDER   Celery Seed Oil Take 1 capsule by mouth in the morning and at bedtime.   cyclobenzaprine 10 MG tablet Commonly known as: FLEXERIL Take 1 tablet (10 mg total) by mouth 3 times/day as needed-between meals & bedtime for muscle spasms.   Eliquis 5 MG Tabs tablet Generic drug: apixaban TAKE 1 TABLET BY MOUTH TWICE A DAY   ezetimibe 10 MG tablet Commonly known as: ZETIA Take 1 tablet (10 mg total) by mouth daily.   finasteride 5 MG tablet Commonly known as: PROSCAR Take 1 tablet (5 mg total) by mouth daily.   Fish Oil 1200 MG Caps Take 2,400-3,600 capsules by mouth daily. Take 2,400 mg by mouth in the morning and 3,600 mg in the evening   glucosamine-chondroitin 500-400 MG tablet Take 1 tablet by mouth 2 (two) times daily.   Magnesium 300 MG Caps Take 300 mg by mouth at bedtime.   silodosin 8 MG Caps capsule Commonly known as: RAPAFLO Take 1 capsule (8 mg total) by mouth daily with breakfast.   tadalafil 20 MG tablet Commonly known as:  CIALIS TAKE ONE TABLET BY MOUTH DAILY AS NEEDED FOR ERECTILE DYSFUNCTION   testosterone cypionate 200 MG/ML injection Commonly known as: DEPOTESTOSTERONE CYPIONATE INJECT 0.5ML INTRAMUSCULARLY ONCE A WEEK   valACYclovir 1000 MG tablet Commonly known as: VALTREX Take 1 tablet (1,000 mg total) by mouth daily.   vitamin D3 50 MCG (2000 UT) Caps Take 6,000 Units by mouth.   vitamin E 180 MG (400 UNITS) capsule Take 400 Units by mouth daily.        Allergies: No Known Allergies  Family History: Family History  Problem Relation Age of Onset   Breast cancer Mother    Heart disease Mother    Hyperlipidemia Mother    Hypertension Mother    Colon cancer Father    Heart disease Father    Alzheimer's disease Father    Hyperlipidemia  Father    Hyperlipidemia Brother    Multiple sclerosis Sister     Social History:  reports that he has never smoked. He has never used smokeless tobacco. He reports current alcohol use of about 7.0 standard drinks per week. He reports that he does not use drugs.  ROS: All other review of systems were reviewed and are negative except what is noted above in HPI  Physical Exam: BP (!) 151/86   Pulse 98   Constitutional:  Alert and oriented, No acute distress. HEENT: [place] AT, moist mucus membranes.  Trachea midline, no masses. Cardiovascular: No clubbing, cyanosis, or edema. Respiratory: Normal respiratory effort, no increased work of breathing. GI: Abdomen is soft, nontender, nondistended, no abdominal masses GU: No CVA tenderness. Circumcised phallus. No masses/lesions on penis, testis, scrotum. Right epididymal tender to palpation. Lymph: No cervical or inguinal lymphadenopathy. Skin: No rashes, bruises or suspicious lesions. Neurologic: Grossly intact, no focal deficits, moving all 4 extremities. Psychiatric: Normal mood and affect.  Laboratory Data: Lab Results  Component Value Date   WBC 4.1 02/24/2021   HGB 16.3 02/24/2021   HCT 47.5 02/24/2021   MCV 90 02/24/2021   PLT 142.0 (L) 12/19/2020    Lab Results  Component Value Date   CREATININE 1.03 02/24/2021    Lab Results  Component Value Date   PSA 3.61 10/21/2017   PSA 3.47 06/29/2016   PSA 2.34 04/18/2015    Lab Results  Component Value Date   TESTOSTERONE 631 02/24/2021    Lab Results  Component Value Date   HGBA1C 5.5 09/15/2019    Urinalysis    Component Value Date/Time   APPEARANCEUR Clear 02/24/2021 1039   GLUCOSEU Negative 02/24/2021 1039   BILIRUBINUR Negative 02/24/2021 1039   PROTEINUR Negative 02/24/2021 1039   NITRITE Negative 02/24/2021 1039   LEUKOCYTESUR Negative 02/24/2021 1039    Lab Results  Component Value Date   LABMICR Comment 02/24/2021    Pertinent Imaging:  No results  found for this or any previous visit.  No results found for this or any previous visit.  No results found for this or any previous visit.  No results found for this or any previous visit.  No results found for this or any previous visit.  No results found for this or any previous visit.  No results found for this or any previous visit.  No results found for this or any previous visit.   Assessment & Plan:    1. Testicular mass -RTC 1 week with scrotal US    No follow-ups on file.  Nicolette Bang, MD  [facility] [place]

## 2021-10-03 NOTE — Progress Notes (Signed)
Urological Symptom Review  Patient is experiencing the following symptoms: Get up at night to urinate   Review of Systems  Gastrointestinal (upper)  : Negative for upper GI symptoms  Gastrointestinal (lower) : Negative for lower GI symptoms  Constitutional : Negative for symptoms  Skin: Negative for skin symptoms  Eyes: Negative for eye symptoms  Ear/Nose/Throat : Negative for Ear/Nose/Throat symptoms  Hematologic/Lymphatic: Negative for Hematologic/Lymphatic symptoms  Cardiovascular : Negative for cardiovascular symptoms  Respiratory : Negative for respiratory symptoms  Endocrine: Negative for endocrine symptoms  Musculoskeletal: Back pain  Neurological: Negative for neurological symptoms  Psychologic: Negative for psychiatric symptoms  

## 2021-10-04 ENCOUNTER — Ambulatory Visit (HOSPITAL_COMMUNITY)
Admission: RE | Admit: 2021-10-04 | Discharge: 2021-10-04 | Disposition: A | Discharge status: Home / Self Care | Payer: Medicare PPO | Source: Ambulatory Visit | Attending: Urology | Admitting: Urology

## 2021-10-04 DIAGNOSIS — N5089 Other specified disorders of the male genital organs: Secondary | ICD-10-CM | POA: Insufficient documentation

## 2021-10-06 NOTE — Telephone Encounter (Signed)
Last office visit 12/19/2020 for [place] with R. Baity.  Last refilled 12/21/2020 for #90 with 2 refills.  Patient has TOC appointment with Jannette Spanner on 10/13/2021.

## 2021-10-11 ENCOUNTER — Ambulatory Visit (INDEPENDENT_AMBULATORY_CARE_PROVIDER_SITE_OTHER): Payer: Medicare PPO | Admitting: Urology

## 2021-10-11 ENCOUNTER — Encounter: Payer: Self-pay | Admitting: Urology

## 2021-10-11 ENCOUNTER — Other Ambulatory Visit: Payer: Self-pay

## 2021-10-11 DIAGNOSIS — N5089 Other specified disorders of the male genital organs: Secondary | ICD-10-CM

## 2021-10-11 NOTE — Progress Notes (Signed)
10/11/2021 9:05 AM   Harold Schwartz 07/06/1956 [id number]  Referring provider: Jearld Fenton, NP [address] [place],  [place] [address]  Patient location: home Physician location: office I connected with  Talon Regala on 10/11/21 by a video enabled telemedicine application and verified that I am speaking with the correct person using two identifiers.   I discussed the limitations of evaluation and management by telemedicine. The patient expressed understanding and agreed to proceed.    Followup right testis mass  HPI: Harold Schwartz is a 55EP here for followup for right testis mass. He is currently on bactrim for epididymitis. Scrotal US showed a 1cm right epididymal head cyst and a right hydrocele. His scrotal pain has resolved. He denies any worsening LUTS. No other complaints today.    PMH: Past Medical History:  Diagnosis Date   Atrial flutter ([place])    Colon polyps    Depression    DVT (deep vein thrombosis) in pregnancy    last one in March 2019 taking eliquis   Genital herpes    Heart murmur    Hemorrhoids    Hyperlipidemia    Hypogonadism in male    Neuromuscular disorder (HCC)    slight numbnes in right foot    Surgical History: Past Surgical History:  Procedure Laterality Date   A-FLUTTER ABLATION N/A 03/22/2017   Procedure: A-Flutter Ablation;  Surgeon: Deboraha Sprang, MD;  Location: [place] [facility];  Service: Cardiovascular;  Laterality: N/A;   COLONOSCOPY WITH PROPOFOL N/A 05/16/2018   Procedure: COLONOSCOPY WITH PROPOFOL;  Surgeon: Lucilla Lame, MD;  Location: [place];  Service: Endoscopy;  Laterality: N/A;   KNEE ARTHROSCOPY     POLYPECTOMY  05/16/2018   Procedure: POLYPECTOMY;  Surgeon: Lucilla Lame, MD;  Location: [place];  Service: Endoscopy;;   TENOTOMY ACHILLES TENDON Right 08/22/2018   VASECTOMY  1990    Home Medications:  Allergies as of 10/11/2021   No Known Allergies      Medication List        Accurate as of  October 11, 2021  9:05 AM. If you have any questions, ask your nurse or doctor.          B-D 3CC LUER-LOK SYR 23GX1" 23G X 1" 3 ML Misc Generic drug: SYRINGE-NEEDLE (DISP) 3 ML USE AS DIRECTED BY PROVIDER   BD Hypodermic Needle 18G X 1" Misc Generic drug: NEEDLE (DISP) 18 G USE AS DIRECTED BY PROVIDER   Celery Seed Oil Take 1 capsule by mouth in the morning and at bedtime.   cyclobenzaprine 10 MG tablet Commonly known as: FLEXERIL Take 1 tablet (10 mg total) by mouth 3 times/day as needed-between meals & bedtime for muscle spasms.   Eliquis 5 MG Tabs tablet Generic drug: apixaban TAKE 1 TABLET BY MOUTH TWICE A DAY   ezetimibe 10 MG tablet Commonly known as: ZETIA TAKE ONE TABLET BY MOUTH DAILY   finasteride 5 MG tablet Commonly known as: PROSCAR Take 1 tablet (5 mg total) by mouth daily.   Fish Oil 1200 MG Caps Take 2,400-3,600 capsules by mouth daily. Take 2,400 mg by mouth in the morning and 3,600 mg in the evening   glucosamine-chondroitin 500-400 MG tablet Take 1 tablet by mouth 2 (two) times daily.   Magnesium 300 MG Caps Take 300 mg by mouth at bedtime.   silodosin 8 MG Caps capsule Commonly known as: RAPAFLO Take 1 capsule (8 mg total) by mouth daily with breakfast.  sulfamethoxazole-trimethoprim 800-160 MG tablet Commonly known as: BACTRIM DS Take 1 tablet by mouth every 12 (twelve) hours.   tadalafil 20 MG tablet Commonly known as: CIALIS TAKE ONE TABLET BY MOUTH DAILY AS NEEDED FOR ERECTILE DYSFUNCTION   testosterone cypionate 200 MG/ML injection Commonly known as: DEPOTESTOSTERONE CYPIONATE INJECT 0.5ML INTRAMUSCULARLY ONCE A WEEK   valACYclovir 1000 MG tablet Commonly known as: VALTREX Take 1 tablet (1,000 mg total) by mouth daily.   vitamin D3 50 MCG (2000 UT) Caps Take 6,000 Units by mouth.   vitamin E 180 MG (400 UNITS) capsule Take 400 Units by mouth daily.        Allergies: No Known Allergies  Family History: Family History   Problem Relation Age of Onset   Breast cancer Mother    Heart disease Mother    Hyperlipidemia Mother    Hypertension Mother    Colon cancer Father    Heart disease Father    Alzheimer's disease Father    Hyperlipidemia Father    Hyperlipidemia Brother    Multiple sclerosis Sister     Social History:  reports that he has never smoked. He has never used smokeless tobacco. He reports current alcohol use of about 7.0 standard drinks per week. He reports that he does not use drugs.  ROS: All other review of systems were reviewed and are negative except what is noted above in HPI   Laboratory Data: Lab Results  Component Value Date   WBC 4.1 02/24/2021   HGB 16.3 02/24/2021   HCT 47.5 02/24/2021   MCV 90 02/24/2021   PLT 142.0 (L) 12/19/2020    Lab Results  Component Value Date   CREATININE 1.03 02/24/2021    Lab Results  Component Value Date   PSA 3.61 10/21/2017   PSA 3.47 06/29/2016   PSA 2.34 04/18/2015    Lab Results  Component Value Date   TESTOSTERONE 631 02/24/2021    Lab Results  Component Value Date   HGBA1C 5.5 09/15/2019    Urinalysis    Component Value Date/Time   APPEARANCEUR Clear 02/24/2021 1039   GLUCOSEU Negative 02/24/2021 1039   BILIRUBINUR Negative 02/24/2021 1039   PROTEINUR Negative 02/24/2021 1039   NITRITE Negative 02/24/2021 1039   LEUKOCYTESUR Negative 02/24/2021 1039    Lab Results  Component Value Date   LABMICR Comment 02/24/2021    Pertinent Imaging: Scrotal US 10/19: Images reviewed and discussed with the patient  No results found for this or any previous visit.  No results found for this or any previous visit.  No results found for this or any previous visit.  No results found for this or any previous visit.  No results found for this or any previous visit.  No results found for this or any previous visit.  No results found for this or any previous visit.  No results found for this or any previous  visit.   Assessment & Plan:    1. Testicular mass -Benign and related to hydrocele/epididymal cyst.    No follow-ups on file.  Nicolette Bang, MD  [facility] [place]

## 2021-10-11 NOTE — Patient Instructions (Signed)
Epididymitis Epididymitis is swelling (inflammation) or infection of the epididymis. The epididymis is a cord-like structure that is located along the top and back part of the testicle. It collects and stores sperm from the testicle. This condition can also cause pain and swelling of the testicle and scrotum. Symptoms usually start suddenly (acute epididymitis). Sometimes epididymitis starts gradually and lasts for a while (chronic epididymitis). This type may be harder to treat. What are the causes? In men ages 44-40, this condition is usually caused by a bacterial infection or a sexually transmitted disease (STD), such as: Gonorrhea. Chlamydia. In men 50 and older who do not have anal sex, this condition is usually caused by bacteria from a blockage or from abnormalities in the urinary system. These can result from: Having a tube placed into the bladder (urinary catheter). Having an enlarged or inflamed prostate gland. Having recently had urinary tract surgery. Having a problem with a backward flow of urine (retrograde). In men who have a condition that weakens the body's defense system (immune system), such as HIV, this condition can be caused by: Other bacteria, including tuberculosis and syphilis. Viruses. Fungi. Sometimes this condition occurs without infection. This may happen because of trauma or repetitive activities such as sports. What increases the risk? You are more likely to develop this condition if you have: Unprotected sex with more than one partner. Anal sex. Recently had surgery. A urinary catheter. Urinary problems. A suppressed immune system. What are the signs or symptoms? This condition usually begins suddenly with chills, fever, and pain behind the scrotum and in the testicle. Other symptoms include: Swelling of the scrotum, testicle, or both. Pain when ejaculating or urinating. Pain in the back or abdomen. Nausea. Itching and discharge from the penis. A  frequent need to pass urine. Redness, increased warmth, and tenderness of the scrotum. How is this diagnosed? Your health care provider can diagnose this condition based on your symptoms and medical history. Your health care provider will also do a physical exam to ask about your symptoms and check your scrotum and testicle for swelling, pain, and redness. You may also have other tests, including: Examination of discharge from the penis. Urine tests for infections, such as STDs. Ultrasound test for blood flow and inflammation. Your health care provider may test you for other STDs, including HIV. How is this treated? Treatment for this condition depends on the cause. If your condition is caused by a bacterial infection, oral antibiotic medicine may be prescribed. If the bacterial infection has spread to your blood, you may need to receive IV antibiotics. For both bacterial and nonbacterial epididymitis, you may be treated with: Rest. Elevation of the scrotum. Pain medicines. Anti-inflammatory medicines. Surgery may be needed to treat: Bacterial epididymitis that causes pus to build up in the scrotum (abscess). Chronic epididymitis that has not responded to other treatments. Follow these instructions at home: Medicines Take over-the-counter and prescription medicines only as told by your health care provider. If you were prescribed an antibiotic medicine, take it as told by your health care provider. Do not stop taking the antibiotic even if your condition improves. Sexual activity If your epididymitis was caused by an STD, avoid sexual activity until your treatment is complete. Inform your sexual partner or partners if you test positive for an STD. They may need to be treated. Do not engage in sexual activity with your partner or partners until their treatment is completed. Managing pain and swelling  If directed, elevate your scrotum and apply  ice. Put ice in a plastic bag. Place a small  towel or pillow between your legs. Rest your scrotum on the pillow or towel. Place another towel between your skin and the plastic bag. Leave the ice on for 20 minutes, 2-3 times a day. Try taking a sitz bath to help with discomfort. This is a warm water bath that is taken while you are sitting down. The water should only come up to your hips and should cover your buttocks. Do this 3-4 times per day or as told by your health care provider. Keep your scrotum elevated and supported while resting. Ask your health care provider if you should wear a scrotal support, such as a jockstrap. Wear it as told by your health care provider. General instructions Return to your normal activities as told by your health care provider. Ask your health care provider what activities are safe for you. Drink enough fluid to keep your urine pale yellow. Keep all follow-up visits as told by your health care provider. This is important. Contact a health care provider if: You have a fever. Your pain medicine is not helping. Your pain is getting worse. Your symptoms do not improve within 3 days. Summary Epididymitis is swelling (inflammation) or infection of the epididymis. This condition can also cause pain and swelling of the testicle and scrotum. Treatment for this condition depends on the cause. If your condition is caused by a bacterial infection, oral antibiotic medicine may be prescribed. Inform your sexual partner or partners if you test positive for an STD. They may need to be treated. Do not engage in sexual activity with your partner or partners until their treatment is completed. Contact a health care provider if your symptoms do not improve within 3 days. This information is not intended to replace advice given to you by your health care provider. Make sure you discuss any questions you have with your health care provider. Document Revised: 02/15/2021 Document Reviewed: 10/07/2018 Elsevier Patient Education   2022 [employer].

## 2021-10-12 ENCOUNTER — Encounter: Payer: Self-pay | Admitting: Nurse Practitioner

## 2021-10-13 ENCOUNTER — Encounter: Payer: Medicare PPO | Admitting: Nurse Practitioner

## 2021-10-17 ENCOUNTER — Encounter: Payer: Self-pay | Admitting: Urology

## 2021-10-17 NOTE — Patient Instructions (Signed)
Spermatocele A spermatocele is a fluid-filled sac, or a cyst, inside the sac that holds the testicles (scrotum). This type of cyst often forms in the epididymis. The epididymis is a coiled tube at the top of each testicle, and this tube is where sperm are stored. The cyst sometimes forms along a tube called the vas deferens, which is a tube that carries sperm away from the epididymis. Spermatoceles are usually painless. Most cysts are small, but they can grow larger. Spermatoceles are not cancerous (are benign). What are the causes? The cause of this condition is not known. However, this condition usually results from a blockage in one of the many small tubes (tubules) that carry sperm from your testicle to your vas deferens. What are the signs or symptoms? In most cases, small cysts do not cause symptoms. However, symptoms sometimes occur. Symptoms of this condition include: Dull pain. A feeling of heaviness. An enlarged scrotum, if your cyst is large. How is this diagnosed? This condition is diagnosed based on a physical exam. You or your health care provider may notice your cyst when feeling your scrotum. Your health care provider may shine a light through (transilluminate) your scrotum to see if light will pass through your cyst. An ultrasound of the scrotum may also be done to rule out a tumor. How is this treated? Small spermatoceles do not need to be treated. If your spermatocele has grown large or is uncomfortable, your health care provider may recommend surgery to remove it. Follow these instructions at home: Watch your spermatocele for any changes. Do regular self-exams of your scrotum. Keep all follow-up visits as told by your health care provider. This is important. Contact a health care provider if: Your spermatocele gets larger. You have pain in your scrotum. Your spermatocele comes back after treatment. Get help right away if: You experience severe pain and redness of your  scrotum. Summary A spermatocele is a fluid-filled sac, or a cyst, inside the sac that holds the testicles (scrotum). This condition is usually painless, and it is not cancerous (is benign). Your health care provider may recommend surgery to remove your spermatocele if it grows large or is uncomfortable. If you have a spermatocele, watch for any changes and do self-exams of your scrotum. Keep all follow-up visits as told by your health care provider. This is important. This information is not intended to replace advice given to you by your health care provider. Make sure you discuss any questions you have with your health care provider. Document Revised: 03/15/2021 Document Reviewed: 10/16/2019 Elsevier Patient Education  2022 [employer].

## 2021-10-27 ENCOUNTER — Other Ambulatory Visit: Payer: Self-pay

## 2021-10-27 ENCOUNTER — Encounter: Payer: Self-pay | Admitting: Nurse Practitioner

## 2021-10-27 ENCOUNTER — Ambulatory Visit: Payer: Medicare PPO | Admitting: Nurse Practitioner

## 2021-10-27 VITALS — BP 114/80 | HR 64 | Temp 97.6°F | Resp 10 | Ht 71.0 in | Wt 201.0 lb

## 2021-10-27 DIAGNOSIS — N5201 Erectile dysfunction due to arterial insufficiency: Secondary | ICD-10-CM

## 2021-10-27 DIAGNOSIS — R944 Abnormal results of kidney function studies: Secondary | ICD-10-CM | POA: Diagnosis not present

## 2021-10-27 DIAGNOSIS — Z0000 Encounter for general adult medical examination without abnormal findings: Secondary | ICD-10-CM | POA: Insufficient documentation

## 2021-10-27 DIAGNOSIS — Z7689 Persons encountering health services in other specified circumstances: Secondary | ICD-10-CM | POA: Diagnosis not present

## 2021-10-27 DIAGNOSIS — R351 Nocturia: Secondary | ICD-10-CM

## 2021-10-27 DIAGNOSIS — D696 Thrombocytopenia, unspecified: Secondary | ICD-10-CM | POA: Insufficient documentation

## 2021-10-27 DIAGNOSIS — E291 Testicular hypofunction: Secondary | ICD-10-CM

## 2021-10-27 DIAGNOSIS — E781 Pure hyperglyceridemia: Secondary | ICD-10-CM | POA: Diagnosis not present

## 2021-10-27 DIAGNOSIS — I825Y2 Chronic embolism and thrombosis of unspecified deep veins of left proximal lower extremity: Secondary | ICD-10-CM

## 2021-10-27 DIAGNOSIS — I4892 Unspecified atrial flutter: Secondary | ICD-10-CM

## 2021-10-27 LAB — CBC
HCT: 45.1 % (ref 39.0–52.0)
Hemoglobin: 15 g/dL (ref 13.0–17.0)
MCHC: 33.4 g/dL (ref 30.0–36.0)
MCV: 94.4 fl (ref 78.0–100.0)
Platelets: 131 10*3/uL — ABNORMAL LOW (ref 150.0–400.0)
RBC: 4.77 Mil/uL (ref 4.22–5.81)
RDW: 14.1 % (ref 11.5–15.5)
WBC: 3.7 10*3/uL — ABNORMAL LOW (ref 4.0–10.5)

## 2021-10-27 LAB — COMPREHENSIVE METABOLIC PANEL
ALT: 20 U/L (ref 0–53)
AST: 22 U/L (ref 0–37)
Albumin: 4 g/dL (ref 3.5–5.2)
Alkaline Phosphatase: 37 U/L — ABNORMAL LOW (ref 39–117)
BUN: 19 mg/dL (ref 6–23)
CO2: 30 mEq/L (ref 19–32)
Calcium: 8.9 mg/dL (ref 8.4–10.5)
Chloride: 103 mEq/L (ref 96–112)
Creatinine, Ser: 1.07 mg/dL (ref 0.40–1.50)
GFR: 72.17 mL/min (ref >60.00)
Glucose, Bld: 88 mg/dL (ref 70–99)
Potassium: 4.5 mEq/L (ref 3.5–5.1)
Sodium: 138 mEq/L (ref 135–145)
Total Bilirubin: 0.9 mg/dL (ref 0.2–1.2)
Total Protein: 6.9 g/dL (ref 6.0–8.3)

## 2021-10-27 LAB — LIPID PANEL
Cholesterol: 175 mg/dL (ref 0–200)
HDL: 73.7 mg/dL (ref >39.00)
LDL Cholesterol: 90 mg/dL (ref 0–99)
NonHDL: 101.62
Total CHOL/HDL Ratio: 2
Triglycerides: 58 mg/dL (ref 0.0–149.0)
VLDL: 11.6 mg/dL (ref 0.0–40.0)

## 2021-10-27 MED ORDER — EZETIMIBE 10 MG PO TABS: 10.0000 mg | ORAL_TABLET | Freq: Every day | ORAL | 1 refills | Status: DC

## 2021-10-27 MED ORDER — APIXABAN 5 MG PO TABS: 5.0000 mg | ORAL_TABLET | Freq: Two times a day (BID) | ORAL | 1 refills | Status: DC

## 2021-10-27 NOTE — Assessment & Plan Note (Signed)
Noticed on recent blood work recheck today.  Pending lab work

## 2021-10-27 NOTE — Assessment & Plan Note (Signed)
Baseline per patient slightly decreased platelet count on several years worth of CBCs.  Recheck today and lab results.

## 2021-10-27 NOTE — Progress Notes (Signed)
Established Patient Office Visit  Subjective:  Patient ID: Harold Schwartz, male    DOB: 04-17-1956  Age: 65 y.o. MRN: [id number]  CC:  Chief Complaint  Patient presents with   Transfer of Care    HPI Harold Schwartz presents for [facility]  PAf: Ablation that resolved the flutter. No cardiology follow ups, they cleared him  DVT: Hx of chronic DVT. Considered provoked. Had a second one. Was evaluated by Cardiology and hematology. Lifelong anticoagulation.   Hypertriglyceridemia:Walks everyday 4 miles takes over an hour. Eats variety of fruits and fresh vegatables. Has limited red meat. On fish oil and zeita   Nocturia: prostate biopsy x2. Begin. Dr. Ronne Binning. Every 6 months. On finasteride  ED: Managed by urology.    Hypogonadism:post vasectomy. Had complications. Managed by urology  Numbness right foot: for years. Has not changed or progressed. Sister does have MS. Has been evaluated by neurosurgeon and feel it is related to nerve in back. Past Medical History:  Diagnosis Date   Atrial flutter (HCC)    Colon polyps    Depression    DVT (deep vein thrombosis) in pregnancy    last one in March 2019 taking eliquis   Genital herpes    Heart murmur    Hemorrhoids    Hyperlipidemia    Hypogonadism in male    Neuromuscular disorder (HCC)    slight numbnes in right foot    Past Surgical History:  Procedure Laterality Date   A-FLUTTER ABLATION N/A 03/22/2017   Procedure: A-Flutter Ablation;  Surgeon: Duke Salvia, MD;  Location: [facility];  Service: Cardiovascular;  Laterality: N/A;   COLONOSCOPY WITH PROPOFOL N/A 05/16/2018   Procedure: COLONOSCOPY WITH PROPOFOL;  Surgeon: Midge Minium, MD;  Location: [facility];  Service: Endoscopy;  Laterality: N/A;   KNEE ARTHROSCOPY     POLYPECTOMY  05/16/2018   Procedure: POLYPECTOMY;  Surgeon: Midge Minium, MD;  Location: [facility];  Service: Endoscopy;;   TENOTOMY ACHILLES TENDON Right 08/22/2018   VASECTOMY  1990     Family History  Problem Relation Age of Onset   Breast cancer Mother    Heart disease Mother    Hyperlipidemia Mother    Hypertension Mother    Colon cancer Father    Heart disease Father    Alzheimer's disease Father    Hyperlipidemia Father    Hyperlipidemia Brother    Multiple sclerosis Sister     Social History   Socioeconomic History   Marital status: Married    Spouse name: Not on file   Number of children: 2   Years of education: Not on file   Highest education level: Not on file  Occupational History   Occupation: TEFL teacher  Tobacco Use   Smoking status: Never   Smokeless tobacco: Never  Vaping Use   Vaping Use: Never used  Substance and Sexual Activity   Alcohol use: Yes    Alcohol/week: 7.0 standard drinks    Types: 7 Shots of liquor per week    Comment: moderate   Drug use: No   Sexual activity: Not on file  Other Topics Concern   Not on file  Social History Narrative   Not on file   Social Determinants of Health   Financial Resource Strain: Not on file  Food Insecurity: Not on file  Transportation Needs: Not on file  Physical Activity: Not on file  Stress: Not on file  Social Connections: Not on file  Intimate Partner Violence:  Not on file    Outpatient Medications Prior to Visit  Medication Sig Dispense Refill   BD HYPODERMIC NEEDLE 18G X 1" MISC USE AS DIRECTED BY PROVIDER 12 each 10   Celery Seed OIL Take 1 capsule by mouth in the morning and at bedtime.      Cholecalciferol (VITAMIN D3) 50 MCG (2000 UT) CAPS Take 6,000 Units by mouth.     cyclobenzaprine (FLEXERIL) 10 MG tablet Take 1 tablet (10 mg total) by mouth 3 times/day as needed-between meals & bedtime for muscle spasms. 30 tablet 0   ELIQUIS 5 MG TABS tablet TAKE 1 TABLET BY MOUTH TWICE A DAY 180 tablet 1   ezetimibe (ZETIA) 10 MG tablet TAKE ONE TABLET BY MOUTH DAILY 90 tablet 2   finasteride (PROSCAR) 5 MG tablet Take 1 tablet (5 mg total) by mouth daily. 90 tablet 3    glucosamine-chondroitin 500-400 MG tablet Take 1 tablet by mouth 2 (two) times daily.     Magnesium 300 MG CAPS Take 300 mg by mouth at bedtime.      Omega-3 Fatty Acids (FISH OIL) 1200 MG CAPS Take 2,400-3,600 capsules by mouth daily. Take 2,400 mg by mouth in the morning and 3,600 mg in the evening     silodosin (RAPAFLO) 8 MG CAPS capsule Take 1 capsule (8 mg total) by mouth daily with breakfast. 30 capsule 11   SYRINGE-NEEDLE, DISP, 3 ML (B-D 3CC LUER-LOK SYR 23GX1") 23G X 1" 3 ML MISC USE AS DIRECTED BY PROVIDER 12 each 5   tadalafil (CIALIS) 20 MG tablet TAKE ONE TABLET BY MOUTH DAILY AS NEEDED FOR ERECTILE DYSFUNCTION 30 tablet 3   testosterone cypionate (DEPOTESTOSTERONE CYPIONATE) 200 MG/ML injection INJECT 0.5ML INTRAMUSCULARLY ONCE A WEEK 6 mL 3   valACYclovir (VALTREX) 1000 MG tablet Take 500 mg by mouth daily.     vitamin E 180 MG (400 UNITS) capsule Take 400 Units by mouth daily.     sulfamethoxazole-trimethoprim (BACTRIM DS) 800-160 MG tablet Take 1 tablet by mouth every 12 (twelve) hours. 28 tablet 0   valACYclovir (VALTREX) 1000 MG tablet Take 1 tablet (1,000 mg total) by mouth daily. (Patient taking differently: Take 500 mg by mouth daily.) 90 tablet 2   No facility-administered medications prior to visit.    No Known Allergies  ROS Review of Systems  Constitutional:  Negative for chills, fatigue and fever.  Respiratory:  Negative for cough and shortness of breath.   Cardiovascular:  Negative for chest pain, palpitations and leg swelling.  Gastrointestinal:  Negative for abdominal pain, blood in stool, constipation, diarrhea and vomiting.  Genitourinary:  Negative for dysuria, hematuria, penile discharge, penile swelling, scrotal swelling and testicular pain.       Nocturia x 1   Neurological:  Positive for numbness (Right lateral foot and last 2 toes). Negative for dizziness, light-headedness and headaches.  Psychiatric/Behavioral:  Negative for hallucinations and  suicidal ideas.      Objective:    Physical Exam Vitals and nursing note reviewed.  Constitutional:      Appearance: Normal appearance.  HENT:     Right Ear: Tympanic membrane, ear canal and external ear normal. There is no impacted cerumen.     Left Ear: Tympanic membrane, ear canal and external ear normal. There is no impacted cerumen.     Mouth/Throat:     Mouth: Mucous membranes are moist.     Pharynx: Oropharynx is clear.  Eyes:     Extraocular Movements: Extraocular movements intact.  Pupils: Pupils are equal, round, and reactive to light.     Comments: Wears corrective lenses  Neck:     Thyroid: No thyroid mass, thyromegaly or thyroid tenderness.  Cardiovascular:     Rate and Rhythm: Normal rate and regular rhythm.     Pulses: Normal pulses.  Pulmonary:     Effort: Pulmonary effort is normal.     Breath sounds: Normal breath sounds.  Abdominal:     General: Bowel sounds are normal.  Musculoskeletal:     Right lower leg: No edema.     Left lower leg: No edema.  Lymphadenopathy:     Cervical: No cervical adenopathy.  Skin:    General: Skin is warm.  Neurological:     Mental Status: He is alert.     Motor: No weakness.     Deep Tendon Reflexes:     Reflex Scores:      Bicep reflexes are 2+ on the right side and 2+ on the left side.      Patellar reflexes are 2+ on the right side and 2+ on the left side.    Comments: Bilateral upper and lower extremity strength 5/5  Psychiatric:        Mood and Affect: Mood normal.        Behavior: Behavior normal.        Thought Content: Thought content normal.        Judgment: Judgment normal.    BP 114/80   Pulse 64   Temp 97.6 F (36.4 C)   Resp 10   Ht $R'5\' 11"'lO$  (1.803 m)   Wt 201 lb (91.2 kg)   SpO2 96%   BMI 28.03 kg/m  Wt Readings from Last 3 Encounters:  10/27/21 201 lb (91.2 kg)  07/05/21 193 lb 12.8 oz (87.9 kg)  03/24/21 192 lb (87.1 kg)     Health Maintenance Due  Topic Date Due   Zoster Vaccines-  Shingrix (1 of 2) Never done   Pneumonia Vaccine 31+ Years old (2 - PPSV23 if available, else PCV20) 12/19/2021    There are no preventive care reminders to display for this patient.  Lab Results  Component Value Date   TSH 2.03 06/30/2018   Lab Results  Component Value Date   WBC 4.1 02/24/2021   HGB 16.3 02/24/2021   HCT 47.5 02/24/2021   MCV 90 02/24/2021   PLT 142.0 (L) 12/19/2020   Lab Results  Component Value Date   NA 137 02/24/2021   K 4.4 02/24/2021   CO2 22 02/24/2021   GLUCOSE 83 02/24/2021   BUN 14 02/24/2021   CREATININE 1.03 02/24/2021   BILITOT 0.8 02/24/2021   ALKPHOS 41 (L) 02/24/2021   AST 19 02/24/2021   ALT 17 02/24/2021   PROT 6.9 02/24/2021   ALBUMIN 4.2 02/24/2021   CALCIUM 9.3 02/24/2021   ANIONGAP 10 07/23/2020   EGFR 80 02/24/2021   GFR 56.95 (L) 12/19/2020   Lab Results  Component Value Date   CHOL 211 (H) 12/19/2020   Lab Results  Component Value Date   HDL 61.20 12/19/2020   Lab Results  Component Value Date   LDLCALC 134 (H) 12/19/2020   Lab Results  Component Value Date   TRIG 78.0 12/19/2020   Lab Results  Component Value Date   CHOLHDL 3 12/19/2020   Lab Results  Component Value Date   HGBA1C 5.5 09/15/2019      Assessment & Plan:   Problem List Items Addressed This  Visit       Cardiovascular and Mediastinum   Paroxysmal atrial flutter (HCC)    Historical diagnosis.  Patient underwent ablation and was cleared by cardiac cardiology no atrial flutter since then.      Relevant Medications   apixaban (ELIQUIS) 5 MG TABS tablet   ezetimibe (ZETIA) 10 MG tablet   Chronic deep vein thrombosis (DVT) of proximal vein of left lower extremity ([place])    Has had work-up by hematologist 2 DVTs in the past on chronic anticoagulation.  Continue Eliquis 5 mg twice daily      Relevant Medications   apixaban (ELIQUIS) 5 MG TABS tablet   ezetimibe (ZETIA) 10 MG tablet   Erectile dysfunction due to arterial insufficiency     Managed with tadalafil and followed by Dr. Nicolette Bang urology.  Continue taking medications as prescribed and follow-up as scheduled.      Relevant Medications   apixaban (ELIQUIS) 5 MG TABS tablet   ezetimibe (ZETIA) 10 MG tablet     Endocrine   Male hypogonadism    Getting testosterone replacement followed by urology, Dr. Nicolette Bang.  Continue taking medication as prescribed and follow-up with urology as recommended and scheduled.        Hematopoietic and Hemostatic   Thrombocytopenia ([place])    Baseline per patient slightly decreased platelet count on several years worth of CBCs.  Recheck today and lab results.      Relevant Orders   CBC (Completed)     Other   Hypertriglyceridemia    Patient is working on lifestyle modifications along with Zetia and fish oil.  LDL was also elevated last time we will check cholesterol at today's office visit patient is nonfasting.  Pending results      Relevant Medications   apixaban (ELIQUIS) 5 MG TABS tablet   ezetimibe (ZETIA) 10 MG tablet   Other Relevant Orders   Lipid panel (Completed)   Nocturia    Followed by urology diagnosis of BPH underwent 2 prostate biopsies latter came back negative.  Currently on finasteride.  Continue taking medication as prescribed and follow-up with urology as scheduled and recommended.      Decreased GFR    Noticed on recent blood work recheck today.  Pending lab work      Relevant Orders   Comprehensive metabolic panel (Completed)   Establishing care with new doctor, encounter for - Primary    Reviewed family, surgical, personal history with patient along with medications and allergies.  Brief review of EMR done       No orders of the defined types were placed in this encounter.   Follow-up: Return in about 6 months (around 04/26/2022) for CPE.   This visit occurred during the SARS-CoV-2 public health emergency.  Safety protocols were in place, including screening questions prior to  the visit, additional usage of staff PPE, and extensive cleaning of exam room while observing appropriate contact time as indicated for disinfecting solutions.     Romilda Garret, NP

## 2021-10-27 NOTE — Assessment & Plan Note (Signed)
Getting testosterone replacement followed by urology, Dr. Nicolette Bang.  Continue taking medication as prescribed and follow-up with urology as recommended and scheduled.

## 2021-10-27 NOTE — Assessment & Plan Note (Signed)
Patient is working on lifestyle modifications along with Zetia and fish oil.  LDL was also elevated last time we will check cholesterol at today's office visit patient is nonfasting.  Pending results

## 2021-10-27 NOTE — Assessment & Plan Note (Signed)
Followed by urology diagnosis of BPH underwent 2 prostate biopsies latter came back negative.  Currently on finasteride.  Continue taking medication as prescribed and follow-up with urology as scheduled and recommended.

## 2021-10-27 NOTE — Assessment & Plan Note (Signed)
Reviewed family, surgical, personal history with patient along with medications and allergies.  Brief review of EMR done

## 2021-10-27 NOTE — Assessment & Plan Note (Signed)
Has had work-up by hematologist 2 DVTs in the past on chronic anticoagulation.  Continue Eliquis 5 mg twice daily

## 2021-10-27 NOTE — Assessment & Plan Note (Signed)
Managed with tadalafil and followed by Dr. Nicolette Bang urology.  Continue taking medications as prescribed and follow-up as scheduled.

## 2021-10-27 NOTE — Patient Instructions (Signed)
Nice to see you today Will see you in approx 6 months for your physical Sooner if you need me

## 2021-10-27 NOTE — Assessment & Plan Note (Signed)
Historical diagnosis.  Patient underwent ablation and was cleared by cardiac cardiology no atrial flutter since then.

## 2021-10-31 ENCOUNTER — Encounter: Payer: Self-pay | Admitting: Physician Assistant

## 2021-10-31 ENCOUNTER — Telehealth: Payer: Medicare PPO | Admitting: Physician Assistant

## 2021-10-31 DIAGNOSIS — Z20822 Contact with and (suspected) exposure to covid-19: Secondary | ICD-10-CM | POA: Diagnosis not present

## 2021-10-31 MED ORDER — BENZONATATE 100 MG PO CAPS: 100.0000 mg | ORAL_CAPSULE | Freq: Three times a day (TID) | ORAL | 0 refills | Status: AC

## 2021-10-31 MED ORDER — MOLNUPIRAVIR EUA 200MG CAPSULE: 4.0000 | ORAL_CAPSULE | Freq: Two times a day (BID) | ORAL | 0 refills | Status: AC

## 2021-10-31 MED ORDER — GUAIFENESIN ER 600 MG PO TB12: 600.0000 mg | ORAL_TABLET | Freq: Two times a day (BID) | ORAL | 0 refills | Status: DC

## 2021-10-31 NOTE — Patient Instructions (Signed)
Harold Schwartz, thank you for joining [name], [name] for today's virtual visit.  While this provider is not your primary care provider (PCP), if your PCP is located in our provider database this encounter information will be shared with them immediately following your visit.  Consent: (Patient) Harold Schwartz provided verbal consent for this virtual visit at the beginning of the encounter.  Current Medications:  Current Outpatient Medications:    benzonatate (TESSALON) 100 MG capsule, Take 1 capsule (100 mg total) by mouth every 8 (eight) hours for 5 days., Disp: 15 capsule, Rfl: 0   guaiFENesin (MUCINEX) 600 MG 12 hr tablet, Take 1 tablet (600 mg total) by mouth 2 (two) times daily., Disp: 6 tablet, Rfl: 0   molnupiravir EUA (LAGEVRIO) 200 mg CAPS capsule, Take 4 capsules (800 mg total) by mouth 2 (two) times daily for 5 days., Disp: 40 capsule, Rfl: 0   apixaban (ELIQUIS) 5 MG TABS tablet, Take 1 tablet (5 mg total) by mouth 2 (two) times daily., Disp: 180 tablet, Rfl: 1   BD HYPODERMIC NEEDLE 18G X 1" MISC, USE AS DIRECTED BY PROVIDER, Disp: 12 each, Rfl: 10   Celery Seed OIL, Take 1 capsule by mouth in the morning and at bedtime. , Disp: , Rfl:    Cholecalciferol (VITAMIN D3) 50 MCG (2000 UT) CAPS, Take 6,000 Units by mouth., Disp: , Rfl:    cyclobenzaprine (FLEXERIL) 10 MG tablet, Take 1 tablet (10 mg total) by mouth 3 times/day as needed-between meals & bedtime for muscle spasms., Disp: 30 tablet, Rfl: 0   ezetimibe (ZETIA) 10 MG tablet, Take 1 tablet (10 mg total) by mouth daily., Disp: 90 tablet, Rfl: 1   finasteride (PROSCAR) 5 MG tablet, Take 1 tablet (5 mg total) by mouth daily., Disp: 90 tablet, Rfl: 3   glucosamine-chondroitin 500-400 MG tablet, Take 1 tablet by mouth 2 (two) times daily., Disp: , Rfl:    Magnesium 300 MG CAPS, Take 300 mg by mouth at bedtime. , Disp: , Rfl:    Omega-3 Fatty Acids (FISH OIL) 1200 MG CAPS, Take 2,400-3,600 capsules by mouth daily. Take 2,400  mg by mouth in the morning and 3,600 mg in the evening, Disp: , Rfl:    silodosin (RAPAFLO) 8 MG CAPS capsule, Take 1 capsule (8 mg total) by mouth daily with breakfast., Disp: 30 capsule, Rfl: 11   SYRINGE-NEEDLE, DISP, 3 ML (B-D 3CC LUER-LOK SYR 23GX1") 23G X 1" 3 ML MISC, USE AS DIRECTED BY PROVIDER, Disp: 12 each, Rfl: 5   tadalafil (CIALIS) 20 MG tablet, TAKE ONE TABLET BY MOUTH DAILY AS NEEDED FOR ERECTILE DYSFUNCTION, Disp: 30 tablet, Rfl: 3   testosterone cypionate (DEPOTESTOSTERONE CYPIONATE) 200 MG/ML injection, INJECT 0.5ML INTRAMUSCULARLY ONCE A WEEK, Disp: 6 mL, Rfl: 3   valACYclovir (VALTREX) 1000 MG tablet, Take 500 mg by mouth daily., Disp: , Rfl:    vitamin E 180 MG (400 UNITS) capsule, Take 400 Units by mouth daily., Disp: , Rfl:    Medications ordered in this encounter:  Meds ordered this encounter  Medications   benzonatate (TESSALON) 100 MG capsule    Sig: Take 1 capsule (100 mg total) by mouth every 8 (eight) hours for 5 days.    Dispense:  15 capsule    Refill:  0    Order Specific Question:   Supervising Provider    Answer:   MILLER, BRIAN [[id number]]   molnupiravir EUA (LAGEVRIO) 200 mg CAPS capsule    Sig: Take 4 capsules (  800 mg total) by mouth 2 (two) times daily for 5 days.    Dispense:  40 capsule    Refill:  0    Order Specific Question:   Supervising Provider    Answer:   MILLER, BRIAN [[id number]]   guaiFENesin (MUCINEX) 600 MG 12 hr tablet    Sig: Take 1 tablet (600 mg total) by mouth 2 (two) times daily.    Dispense:  6 tablet    Refill:  0    Order Specific Question:   Supervising Provider    Answer:   Sabra Heck, BRIAN [[id number]]     *If you need refills on other medications prior to your next appointment, please contact your pharmacy*  Follow-Up: Call back or seek an in-person evaluation if the symptoms worsen or if the condition fails to improve as anticipated.  Other Instructions Take molnupirovir, tessalon and mucinex as directed.  Make sure to stay well  hydrated. You can take tylenol for fevers.  Follow up with your regular doctor in 1 week for reassessment and seek care sooner if your symptoms worsen or fail to improve.   If you have been instructed to have an in-person evaluation today at a local Urgent Care facility, please use the link below. It will take you to a list of all of our available [facility], including address, phone number and hours of operation. Please do not delay care.  [facility]  If you or a family member do not have a primary care provider, use the link below to schedule a visit and establish care. When you choose a [facility] primary care physician or advanced practice provider, you gain a long-term partner in health. Find a Primary Care Provider  Learn more about [facility] in-office and virtual care options: [facility]

## 2021-10-31 NOTE — Progress Notes (Signed)
[name], Harold Schwartz are scheduled for a virtual visit with your provider today.    Just as we do with appointments in the office, we must obtain your consent to participate.  Your consent will be active for this visit and any virtual visit you may have with one of our providers in the next 365 days.    If you have a MyChart account, I can also send a copy of this consent to you electronically.  All virtual visits are billed to your insurance company just like a traditional visit in the office.  As this is a virtual visit, video technology does not allow for your provider to perform a traditional examination.  This may limit your provider's ability to fully assess your condition.  If your provider identifies any concerns that need to be evaluated in person or the need to arrange testing such as labs, EKG, etc, we will make arrangements to do so.    Although advances in technology are sophisticated, we cannot ensure that it will always work on either your end or our end.  If the connection with a video visit is poor, we may have to switch to a telephone visit.  With either a video or telephone visit, we are not always able to ensure that we have a secure connection.   I need to obtain your verbal consent now.   Are you willing to proceed with your visit today?   Harold Schwartz has provided verbal consent on 10/31/2021 for a virtual visit (video or telephone).   Harold Booze, [name] 10/31/2021  8:40 AM   Date:  10/31/2021   ID:  Harold Schwartz, DOB Jul 19, 1956, MRN [id number]  Patient Location: Home Provider Location: Home Office   Participants: Patient and Provider for Visit and Wrap up  Method of visit: Video  Location of Patient: Home Location of Provider: Home Office Consent was obtain for visit over the video. Services rendered by provider: Visit was performed via video  A video enabled telemedicine application was used and I verified that I am speaking with the correct person using two  identifiers.  PCP:  Michela Pitcher, NP   Chief Complaint:  COVID  History of Present Illness:    Harold Schwartz is a 65 y.o. male with history as stated below. Presents video telehealth for an acute care visit  Reports fatigue, myalgias, sore throat, headache that started yesterday. He took a covid test yesterday that was positive. He has a mild cough and congestion but no fever. Denies chest pain or sob.  Past Medical, Surgical, Social History, Allergies, and Medications have been Reviewed.  Past Medical History:  Diagnosis Date   Atrial flutter ([place])    Colon polyps    Depression    DVT (deep vein thrombosis) in pregnancy    last one in March 2019 taking eliquis   Genital herpes    Heart murmur    Hemorrhoids    Hyperlipidemia    Hypogonadism in male    Neuromuscular disorder (HCC)    slight numbnes in right foot    Current Meds  Medication Sig   benzonatate (TESSALON) 100 MG capsule Take 1 capsule (100 mg total) by mouth every 8 (eight) hours for 5 days.   guaiFENesin (MUCINEX) 600 MG 12 hr tablet Take 1 tablet (600 mg total) by mouth 2 (two) times daily.   molnupiravir EUA (LAGEVRIO) 200 mg CAPS capsule Take 4 capsules (800 mg total) by mouth 2 (two) times daily for 5 days.  Allergies:   Patient has no known allergies.   ROS See HPI for history of present illness.  Physical Exam HENT:     Head: Normocephalic.     Nose: Nose normal.  Pulmonary:     Effort: Pulmonary effort is normal.  Neurological:     Mental Status: He is alert.           MDM: Pt with covid sxs that started 24 hours ago. Sxs mild. Rx for molnupirovir,  tessalon and mucinex.    There are no diagnoses linked to this encounter.   Time:   Today, I have spent 10 minutes with the patient with telehealth technology discussing the above problems, reviewing the chart, previous notes, medications and orders.    Tests Ordered: No orders of the defined types were placed in this  encounter.   Medication Changes: Meds ordered this encounter  Medications   benzonatate (TESSALON) 100 MG capsule    Sig: Take 1 capsule (100 mg total) by mouth every 8 (eight) hours for 5 days.    Dispense:  15 capsule    Refill:  0    Order Specific Question:   Supervising Provider    Answer:   MILLER, BRIAN [[id number]]   molnupiravir EUA (LAGEVRIO) 200 mg CAPS capsule    Sig: Take 4 capsules (800 mg total) by mouth 2 (two) times daily for 5 days.    Dispense:  40 capsule    Refill:  0    Order Specific Question:   Supervising Provider    Answer:   MILLER, BRIAN [[id number]]   guaiFENesin (MUCINEX) 600 MG 12 hr tablet    Sig: Take 1 tablet (600 mg total) by mouth 2 (two) times daily.    Dispense:  6 tablet    Refill:  0    Order Specific Question:   Supervising Provider    Answer:   Noemi Chapel [[id number]]     Disposition:  Follow up  Signed, Boley, [name]  10/31/2021 8:40 AM

## 2021-11-02 ENCOUNTER — Encounter: Payer: Self-pay | Admitting: Nurse Practitioner

## 2021-12-22 ENCOUNTER — Other Ambulatory Visit: Payer: Self-pay | Admitting: Urology

## 2021-12-28 ENCOUNTER — Other Ambulatory Visit: Payer: Medicare PPO

## 2021-12-29 ENCOUNTER — Other Ambulatory Visit: Payer: Medicare PPO

## 2022-01-02 ENCOUNTER — Other Ambulatory Visit: Payer: Self-pay

## 2022-01-02 ENCOUNTER — Other Ambulatory Visit: Payer: Medicare PPO

## 2022-01-02 DIAGNOSIS — R972 Elevated prostate specific antigen [PSA]: Secondary | ICD-10-CM | POA: Diagnosis not present

## 2022-01-03 ENCOUNTER — Other Ambulatory Visit: Payer: Medicare PPO

## 2022-01-03 LAB — PSA: Prostate Specific Ag, Serum: 5.8 ng/mL — ABNORMAL HIGH (ref 0.0–4.0)

## 2022-01-05 ENCOUNTER — Encounter: Payer: Self-pay | Admitting: Urology

## 2022-01-05 ENCOUNTER — Ambulatory Visit: Payer: Medicare PPO | Admitting: Urology

## 2022-01-05 ENCOUNTER — Other Ambulatory Visit: Payer: Self-pay

## 2022-01-05 VITALS — BP 126/77 | HR 71

## 2022-01-05 DIAGNOSIS — R351 Nocturia: Secondary | ICD-10-CM | POA: Diagnosis not present

## 2022-01-05 DIAGNOSIS — R972 Elevated prostate specific antigen [PSA]: Secondary | ICD-10-CM

## 2022-01-05 LAB — URINALYSIS, ROUTINE W REFLEX MICROSCOPIC
Bilirubin, UA: NEGATIVE
Glucose, UA: NEGATIVE
Ketones, UA: NEGATIVE
Leukocytes,UA: NEGATIVE
Nitrite, UA: NEGATIVE
Protein,UA: NEGATIVE
RBC, UA: NEGATIVE
Specific Gravity, UA: 1.025 (ref 1.005–1.030)
Urobilinogen, Ur: 0.2 mg/dL (ref 0.2–1.0)
pH, UA: 6 (ref 5.0–7.5)

## 2022-01-05 NOTE — Patient Instructions (Signed)

## 2022-01-05 NOTE — Progress Notes (Signed)
01/05/2022 1:04 PM   Harold Schwartz 15-Nov-1956 [id number]  Referring provider: Jearld Fenton, NP [place],  [place] [address]  Elevated PSA   HPI: Harold Schwartz is a 34WY here for followup for elevated PSA. PSA decreased from 6.9 to 5.8 on finasteride. He has 2 prior negative prostate biopsies. He denies any worsening LUTS. IPSS 8 QOL 3. No other complaints today   PMH: Past Medical History:  Diagnosis Date   Atrial flutter ([place])    Colon polyps    Depression    DVT (deep vein thrombosis) in pregnancy    last one in March 2019 taking eliquis   Genital herpes    Heart murmur    Hemorrhoids    Hyperlipidemia    Hypogonadism in male    Neuromuscular disorder (HCC)    slight numbnes in right foot    Surgical History: Past Surgical History:  Procedure Laterality Date   A-FLUTTER ABLATION N/A 03/22/2017   Procedure: A-Flutter Ablation;  Surgeon: Deboraha Sprang, MD;  Location: [facility];  Service: Cardiovascular;  Laterality: N/A;   COLONOSCOPY WITH PROPOFOL N/A 05/16/2018   Procedure: COLONOSCOPY WITH PROPOFOL;  Surgeon: Lucilla Lame, MD;  Location: [facility];  Service: Endoscopy;  Laterality: N/A;   KNEE ARTHROSCOPY     POLYPECTOMY  05/16/2018   Procedure: POLYPECTOMY;  Surgeon: Lucilla Lame, MD;  Location: [facility];  Service: Endoscopy;;   TENOTOMY ACHILLES TENDON Right 08/22/2018   VASECTOMY  1990    Home Medications:  Allergies as of 01/05/2022   No Known Allergies      Medication List        Accurate as of January 05, 2022  1:04 PM. If you have any questions, ask your nurse or doctor.          apixaban 5 MG Tabs tablet Commonly known as: Eliquis Take 1 tablet (5 mg total) by mouth 2 (two) times daily.   B-D 3CC LUER-LOK SYR 23GX1" 23G X 1" 3 ML Misc Generic drug: SYRINGE-NEEDLE (DISP) 3 ML USE AS DIRECTED BY PROVIDER   B-D 3CC LUER-LOK SYR 22GX1" 22G X 1" 3 ML Misc Generic drug: SYRINGE-NEEDLE (DISP) 3 ML USE AS  DIRECTED BY PROVIDER   BD Hypodermic Needle 18G X 1" Misc Generic drug: NEEDLE (DISP) 18 G USE AS DIRECTED BY PROVIDER   Celery Seed Oil Take 1 capsule by mouth in the morning and at bedtime.   cyclobenzaprine 10 MG tablet Commonly known as: FLEXERIL Take 1 tablet (10 mg total) by mouth 3 times/day as needed-between meals & bedtime for muscle spasms.   ezetimibe 10 MG tablet Commonly known as: ZETIA Take 1 tablet (10 mg total) by mouth daily.   finasteride 5 MG tablet Commonly known as: PROSCAR Take 1 tablet (5 mg total) by mouth daily.   Fish Oil 1200 MG Caps Take 2,400-3,600 capsules by mouth daily. Take 2,400 mg by mouth in the morning and 3,600 mg in the evening   glucosamine-chondroitin 500-400 MG tablet Take 1 tablet by mouth 2 (two) times daily.   guaiFENesin 600 MG 12 hr tablet Commonly known as: Mucinex Take 1 tablet (600 mg total) by mouth 2 (two) times daily.   Magnesium 300 MG Caps Take 300 mg by mouth at bedtime.   silodosin 8 MG Caps capsule Commonly known as: RAPAFLO Take 1 capsule (8 mg total) by mouth daily with breakfast.   tadalafil 20 MG tablet Commonly known as: CIALIS TAKE  ONE TABLET BY MOUTH DAILY AS NEEDED FOR ERECTILE DYSFUNCTION   testosterone cypionate 200 MG/ML injection Commonly known as: DEPOTESTOSTERONE CYPIONATE INJECT 0.5ML INTRAMUSCULARLY ONCE A WEEK   valACYclovir 1000 MG tablet Commonly known as: VALTREX Take 500 mg by mouth daily.   vitamin D3 50 MCG (2000 UT) Caps Take 6,000 Units by mouth.   vitamin E 180 MG (400 UNITS) capsule Take 400 Units by mouth daily.        Allergies: No Known Allergies  Family History: Family History  Problem Relation Age of Onset   Breast cancer Mother    Heart disease Mother    Hyperlipidemia Mother    Hypertension Mother    Colon cancer Father    Heart disease Father    Alzheimer's disease Father    Hyperlipidemia Father    Multiple sclerosis Sister        primary progessive  MS   Hyperlipidemia Brother    Cancer Brother        Pancreatic 2020    Social History:  reports that he has never smoked. He has never used smokeless tobacco. He reports current alcohol use of about 7.0 standard drinks per week. He reports that he does not use drugs.  ROS: All other review of systems were reviewed and are negative except what is noted above in HPI  Physical Exam: BP 126/77    Pulse 71   Constitutional:  Alert and oriented, No acute distress. HEENT: [place] AT, moist mucus membranes.  Trachea midline, no masses. Cardiovascular: No clubbing, cyanosis, or edema. Respiratory: Normal respiratory effort, no increased work of breathing. GI: Abdomen is soft, nontender, nondistended, no abdominal masses GU: No CVA tenderness.  Lymph: No cervical or inguinal lymphadenopathy. Skin: No rashes, bruises or suspicious lesions. Neurologic: Grossly intact, no focal deficits, moving all 4 extremities. Psychiatric: Normal mood and affect.  Laboratory Data: Lab Results  Component Value Date   WBC 3.7 (L) 10/27/2021   HGB 15.0 10/27/2021   HCT 45.1 10/27/2021   MCV 94.4 10/27/2021   PLT 131.0 (L) 10/27/2021    Lab Results  Component Value Date   CREATININE 1.07 10/27/2021    Lab Results  Component Value Date   PSA 3.61 10/21/2017   PSA 3.47 06/29/2016   PSA 2.34 04/18/2015    Lab Results  Component Value Date   TESTOSTERONE 631 02/24/2021    Lab Results  Component Value Date   HGBA1C 5.5 09/15/2019    Urinalysis    Component Value Date/Time   APPEARANCEUR Clear 02/24/2021 1039   GLUCOSEU Negative 02/24/2021 1039   BILIRUBINUR Negative 02/24/2021 1039   PROTEINUR Negative 02/24/2021 1039   NITRITE Negative 02/24/2021 1039   LEUKOCYTESUR Negative 02/24/2021 1039    Lab Results  Component Value Date   LABMICR Comment 02/24/2021    Pertinent Imaging:  No results found for this or any previous visit.  No results found for this or any previous  visit.  No results found for this or any previous visit.  No results found for this or any previous visit.  No results found for this or any previous visit.  No results found for this or any previous visit.  No results found for this or any previous visit.  No results found for this or any previous visit.   Assessment & Plan:    1. Elevated PSA -We will order Prostate MRI since patient has a rising PSA and 2 negative prostate biopsies.  -ExoDX ordered - Urinalysis, Routine  w reflex microscopic  2. Nocturia Continue rapaflo 8mg  daily   No follow-ups on file.  Nicolette Bang, MD  [facility] [place]

## 2022-01-05 NOTE — Progress Notes (Signed)
Tracking number [id number] Pick up up number [id number]  Exosomed urine test sent

## 2022-01-05 NOTE — Progress Notes (Signed)
Urological Symptom Review  Patient is experiencing the following symptoms: Leakage of urine   Review of Systems  Gastrointestinal (upper)  : Negative for upper GI symptoms  Gastrointestinal (lower) : Negative for lower GI symptoms  Constitutional : Negative for symptoms  Skin: Negative for skin symptoms  Eyes: Negative for eye symptoms  Ear/Nose/Throat : Negative for Ear/Nose/Throat symptoms  Hematologic/Lymphatic: Negative for Hematologic/Lymphatic symptoms  Cardiovascular : Negative for cardiovascular symptoms  Respiratory : Negative for respiratory symptoms  Endocrine: Negative for endocrine symptoms  Musculoskeletal: Negative for musculoskeletal symptoms  Neurological: Negative for neurological symptoms  Psychologic: Negative for psychiatric symptoms  

## 2022-01-11 ENCOUNTER — Other Ambulatory Visit: Payer: Self-pay | Admitting: Urology

## 2022-01-17 ENCOUNTER — Other Ambulatory Visit: Payer: Self-pay

## 2022-01-17 ENCOUNTER — Ambulatory Visit (HOSPITAL_COMMUNITY)
Admission: RE | Admit: 2022-01-17 | Discharge: 2022-01-17 | Disposition: A | Discharge status: Home / Self Care | Payer: Medicare PPO | Source: Ambulatory Visit | Attending: Urology | Admitting: Urology

## 2022-01-17 DIAGNOSIS — N402 Nodular prostate without lower urinary tract symptoms: Secondary | ICD-10-CM | POA: Diagnosis not present

## 2022-01-17 DIAGNOSIS — R972 Elevated prostate specific antigen [PSA]: Secondary | ICD-10-CM | POA: Diagnosis not present

## 2022-01-17 DIAGNOSIS — R590 Localized enlarged lymph nodes: Secondary | ICD-10-CM | POA: Diagnosis not present

## 2022-01-17 DIAGNOSIS — N400 Enlarged prostate without lower urinary tract symptoms: Secondary | ICD-10-CM | POA: Diagnosis not present

## 2022-01-17 MED ORDER — GADOBUTROL 1 MMOL/ML IV SOLN
9.0000 mL | Freq: Once | INTRAVENOUS | Status: AC | PRN
  Administered 2022-01-17: 9 mL via INTRAVENOUS

## 2022-01-19 ENCOUNTER — Ambulatory Visit (HOSPITAL_COMMUNITY): Payer: Medicare PPO

## 2022-01-22 ENCOUNTER — Encounter: Payer: Self-pay | Admitting: Urology

## 2022-02-02 ENCOUNTER — Encounter: Payer: Self-pay | Admitting: Urology

## 2022-02-02 ENCOUNTER — Other Ambulatory Visit: Payer: Self-pay

## 2022-02-02 ENCOUNTER — Ambulatory Visit: Payer: Medicare PPO | Admitting: Urology

## 2022-02-02 VITALS — BP 122/76 | HR 73 | Wt 188.0 lb

## 2022-02-02 DIAGNOSIS — R351 Nocturia: Secondary | ICD-10-CM | POA: Diagnosis not present

## 2022-02-02 DIAGNOSIS — N401 Enlarged prostate with lower urinary tract symptoms: Secondary | ICD-10-CM | POA: Diagnosis not present

## 2022-02-02 DIAGNOSIS — N138 Other obstructive and reflux uropathy: Secondary | ICD-10-CM | POA: Diagnosis not present

## 2022-02-02 DIAGNOSIS — R972 Elevated prostate specific antigen [PSA]: Secondary | ICD-10-CM

## 2022-02-02 LAB — URINALYSIS, ROUTINE W REFLEX MICROSCOPIC
Bilirubin, UA: NEGATIVE
Glucose, UA: NEGATIVE
Ketones, UA: NEGATIVE
Leukocytes,UA: NEGATIVE
Nitrite, UA: NEGATIVE
Protein,UA: NEGATIVE
RBC, UA: NEGATIVE
Specific Gravity, UA: 1.02 (ref 1.005–1.030)
Urobilinogen, Ur: 0.2 mg/dL (ref 0.2–1.0)
pH, UA: 6.5 (ref 5.0–7.5)

## 2022-02-02 MED ORDER — SILODOSIN 8 MG PO CAPS: 8.0000 mg | ORAL_CAPSULE | Freq: Every day | ORAL | 3 refills | Status: DC

## 2022-02-02 MED ORDER — FINASTERIDE 5 MG PO TABS: 5.0000 mg | ORAL_TABLET | Freq: Every day | ORAL | 3 refills | Status: DC

## 2022-02-02 NOTE — Progress Notes (Signed)
02/02/2022 11:09 AM   Harold Schwartz August 26, 1956 [id number]  Referring provider: Michela Pitcher, NP [place],  [place] [address]  Followup elevated PSA   HPI: [name] is a 66yo here for followup for elevated PSA. He underwent prostate MRI which showed 2 small PIRADS3 lesions which has 6-86mm. ExoDX urine test showed low risk of aggressive prostate cancer. PSA 5.8 on finasteride. He has stable moderate LUTS on finasteride 5mg   and rapaflo 8mg  daily. IPSS 13 QOl 2. Nocturia 1-2x. No other complaints today   PMH: Past Medical History:  Diagnosis Date   Atrial flutter ([place])    Colon polyps    Depression    DVT (deep vein thrombosis) in pregnancy    last one in March 2019 taking eliquis   Genital herpes    Heart murmur    Hemorrhoids    Hyperlipidemia    Hypogonadism in male    Neuromuscular disorder (HCC)    slight numbnes in right foot    Surgical History: Past Surgical History:  Procedure Laterality Date   A-FLUTTER ABLATION N/A 03/22/2017   Procedure: A-Flutter Ablation;  Surgeon: Deboraha Sprang, MD;  Location: [facility];  Service: Cardiovascular;  Laterality: N/A;   COLONOSCOPY WITH PROPOFOL N/A 05/16/2018   Procedure: COLONOSCOPY WITH PROPOFOL;  Surgeon: Lucilla Lame, MD;  Location: [facility];  Service: Endoscopy;  Laterality: N/A;   KNEE ARTHROSCOPY     POLYPECTOMY  05/16/2018   Procedure: POLYPECTOMY;  Surgeon: Lucilla Lame, MD;  Location: [facility];  Service: Endoscopy;;   TENOTOMY ACHILLES TENDON Right 08/22/2018   VASECTOMY  1990    Home Medications:  Allergies as of 02/02/2022   No Known Allergies      Medication List        Accurate as of February 02, 2022 11:09 AM. If you have any questions, ask your nurse or doctor.          apixaban 5 MG Tabs tablet Commonly known as: Eliquis Take 1 tablet (5 mg total) by mouth 2 (two) times daily.   B-D 3CC LUER-LOK SYR 23GX1" 23G X 1" 3 ML Misc Generic drug:  SYRINGE-NEEDLE (DISP) 3 ML USE AS DIRECTED BY PROVIDER   B-D 3CC LUER-LOK SYR 22GX1" 22G X 1" 3 ML Misc Generic drug: SYRINGE-NEEDLE (DISP) 3 ML USE AS DIRECTED BY PROVIDER   BD Hypodermic Needle 18G X 1" Misc Generic drug: NEEDLE (DISP) 18 G USE AS DIRECTED BY PROVIDER   Celery Seed Oil Take 1 capsule by mouth in the morning and at bedtime.   cyclobenzaprine 10 MG tablet Commonly known as: FLEXERIL Take 1 tablet (10 mg total) by mouth 3 times/day as needed-between meals & bedtime for muscle spasms.   ezetimibe 10 MG tablet Commonly known as: ZETIA Take 1 tablet (10 mg total) by mouth daily.   finasteride 5 MG tablet Commonly known as: PROSCAR Take 1 tablet (5 mg total) by mouth daily.   Fish Oil 1200 MG Caps Take 2,400-3,600 capsules by mouth daily. Take 2,400 mg by mouth in the morning and 3,600 mg in the evening   glucosamine-chondroitin 500-400 MG tablet Take 1 tablet by mouth 2 (two) times daily.   guaiFENesin 600 MG 12 hr tablet Commonly known as: Mucinex Take 1 tablet (600 mg total) by mouth 2 (two) times daily.   Magnesium 300 MG Caps Take 300 mg by mouth at bedtime.   silodosin 8 MG Caps capsule Commonly known as:  RAPAFLO Take 1 capsule (8 mg total) by mouth daily with breakfast.   tadalafil 20 MG tablet Commonly known as: CIALIS TAKE ONE TABLET BY MOUTH DAILY AS NEEDED FOR ERECTILE DYSFUNCTION   testosterone cypionate 200 MG/ML injection Commonly known as: DEPOTESTOSTERONE CYPIONATE INJECT 0.5ML INTRAMUSCULARLY ONCE A WEEK   valACYclovir 1000 MG tablet Commonly known as: VALTREX Take 500 mg by mouth daily.   vitamin D3 50 MCG (2000 UT) Caps Take 6,000 Units by mouth.   vitamin E 180 MG (400 UNITS) capsule Take 400 Units by mouth daily.        Allergies: No Known Allergies  Family History: Family History  Problem Relation Age of Onset   Breast cancer Mother    Heart disease Mother    Hyperlipidemia Mother    Hypertension Mother     Colon cancer Father    Heart disease Father    Alzheimer's disease Father    Hyperlipidemia Father    Multiple sclerosis Sister        primary progessive MS   Hyperlipidemia Brother    Cancer Brother        Pancreatic 2020    Social History:  reports that he has never smoked. He has never used smokeless tobacco. He reports current alcohol use of about 7.0 standard drinks per week. He reports that he does not use drugs.  ROS: All other review of systems were reviewed and are negative except what is noted above in HPI  Physical Exam: BP 122/76    Pulse 73    Wt 188 lb (85.3 kg)    BMI 26.22 kg/m   Constitutional:  Alert and oriented, No acute distress. HEENT: [place] AT, moist mucus membranes.  Trachea midline, no masses. Cardiovascular: No clubbing, cyanosis, or edema. Respiratory: Normal respiratory effort, no increased work of breathing. GI: Abdomen is soft, nontender, nondistended, no abdominal masses GU: No CVA tenderness.  Lymph: No cervical or inguinal lymphadenopathy. Skin: No rashes, bruises or suspicious lesions. Neurologic: Grossly intact, no focal deficits, moving all 4 extremities. Psychiatric: Normal mood and affect.  Laboratory Data: Lab Results  Component Value Date   WBC 3.7 (L) 10/27/2021   HGB 15.0 10/27/2021   HCT 45.1 10/27/2021   MCV 94.4 10/27/2021   PLT 131.0 (L) 10/27/2021    Lab Results  Component Value Date   CREATININE 1.07 10/27/2021    Lab Results  Component Value Date   PSA 3.61 10/21/2017   PSA 3.47 06/29/2016   PSA 2.34 04/18/2015    Lab Results  Component Value Date   TESTOSTERONE 631 02/24/2021    Lab Results  Component Value Date   HGBA1C 5.5 09/15/2019    Urinalysis    Component Value Date/Time   APPEARANCEUR Clear 01/05/2022 1256   GLUCOSEU Negative 01/05/2022 1256   BILIRUBINUR Negative 01/05/2022 1256   PROTEINUR Negative 01/05/2022 1256   NITRITE Negative 01/05/2022 1256   LEUKOCYTESUR Negative 01/05/2022 1256     Lab Results  Component Value Date   LABMICR Comment 01/05/2022    Pertinent Imaging: MRI 01/17/2022: Images reviewed and discussed with the patient  No results found for this or any previous visit.  No results found for this or any previous visit.  No results found for this or any previous visit.  No results found for this or any previous visit.  No results found for this or any previous visit.  No results found for this or any previous visit.  No results found for this or  any previous visit.  No results found for this or any previous visit.   Assessment & Plan:    1. Elevated PSA -RTC 6 months with PSA - Urinalysis, Routine w reflex microscopic  2. Nocturia -Continue rapaflo 8mg  and finasteride 5mg  daily  3. Benign prostatic hyperplasia with urinary obstruction -Continue rapaflo 8mg  and finasteride 5mg  daily   No follow-ups on file.  Nicolette Bang, MD  [facility] [place]

## 2022-02-02 NOTE — Patient Instructions (Signed)

## 2022-02-28 DIAGNOSIS — M9902 Segmental and somatic dysfunction of thoracic region: Secondary | ICD-10-CM | POA: Diagnosis not present

## 2022-02-28 DIAGNOSIS — M9903 Segmental and somatic dysfunction of lumbar region: Secondary | ICD-10-CM | POA: Diagnosis not present

## 2022-02-28 DIAGNOSIS — M9905 Segmental and somatic dysfunction of pelvic region: Secondary | ICD-10-CM | POA: Diagnosis not present

## 2022-03-06 DIAGNOSIS — M9902 Segmental and somatic dysfunction of thoracic region: Secondary | ICD-10-CM | POA: Diagnosis not present

## 2022-03-06 DIAGNOSIS — M9905 Segmental and somatic dysfunction of pelvic region: Secondary | ICD-10-CM | POA: Diagnosis not present

## 2022-03-06 DIAGNOSIS — M9903 Segmental and somatic dysfunction of lumbar region: Secondary | ICD-10-CM | POA: Diagnosis not present

## 2022-03-12 ENCOUNTER — Encounter: Payer: Self-pay | Admitting: Urology

## 2022-03-13 NOTE — Telephone Encounter (Signed)
Patient wants to do testopel implant ? ?Okay to schedule? ?

## 2022-03-21 ENCOUNTER — Telehealth: Payer: Self-pay

## 2022-03-21 DIAGNOSIS — M9902 Segmental and somatic dysfunction of thoracic region: Secondary | ICD-10-CM | POA: Diagnosis not present

## 2022-03-21 DIAGNOSIS — M9903 Segmental and somatic dysfunction of lumbar region: Secondary | ICD-10-CM | POA: Diagnosis not present

## 2022-03-21 DIAGNOSIS — M9905 Segmental and somatic dysfunction of pelvic region: Secondary | ICD-10-CM | POA: Diagnosis not present

## 2022-03-21 DIAGNOSIS — M9901 Segmental and somatic dysfunction of cervical region: Secondary | ICD-10-CM | POA: Diagnosis not present

## 2022-03-21 NOTE — Telephone Encounter (Signed)
Unable to speak with pt but I was able to speak with pts wife (DPR signed). Harold Schwartz said that pt had an ablation about 4 years ago that took care of atrial flutter. Pt has not had CP,SOB,H/A or dizziness. Pt has been feeling really tired and worn out recently and these symptoms came on suddenly. 2 - 3 days ago pt's fit bit detected afib also. Pt has not felt irregular heart beat. Pt does not take BP at home per wife. Pt had Hx of DVT 2 1/2 - 3 yrs ago and pt has been on Eliquis ever since. Pt just wanted to come in to office to get checked out. UC & ED precautions given and pt's wife voiced understanding. Sending note to Romilda Garret NP and Anastasiya CMA. Pt is keeping the scheduled appt with [name] on 03/22/22 at 8 AM.  ?

## 2022-03-21 NOTE — Telephone Encounter (Signed)
Patient is on the schedule to see Harold Schwartz tomorrow 03/22/22 and the reason says Afib. No other explanation. ?Please triage to make sure patient is stable to wait. Thank you ?

## 2022-03-21 NOTE — Telephone Encounter (Signed)
Noted. Will evaluate in office at appointment ?

## 2022-03-22 ENCOUNTER — Ambulatory Visit (INDEPENDENT_AMBULATORY_CARE_PROVIDER_SITE_OTHER): Payer: Medicare PPO | Admitting: Nurse Practitioner

## 2022-03-22 ENCOUNTER — Encounter: Payer: Self-pay | Admitting: Nurse Practitioner

## 2022-03-22 VITALS — BP 112/70 | HR 72 | Temp 97.4°F | Resp 12 | Ht 71.0 in | Wt 198.5 lb

## 2022-03-22 DIAGNOSIS — R002 Palpitations: Secondary | ICD-10-CM

## 2022-03-22 DIAGNOSIS — Z8679 Personal history of other diseases of the circulatory system: Secondary | ICD-10-CM

## 2022-03-22 DIAGNOSIS — I4892 Unspecified atrial flutter: Secondary | ICD-10-CM

## 2022-03-22 LAB — CBC
HCT: 43.9 % (ref 39.0–52.0)
Hemoglobin: 15.1 g/dL (ref 13.0–17.0)
MCHC: 34.3 g/dL (ref 30.0–36.0)
MCV: 94.1 fl (ref 78.0–100.0)
Platelets: 133 10*3/uL — ABNORMAL LOW (ref 150.0–400.0)
RBC: 4.67 Mil/uL (ref 4.22–5.81)
RDW: 14.6 % (ref 11.5–15.5)
WBC: 4.1 10*3/uL (ref 4.0–10.5)

## 2022-03-22 LAB — COMPREHENSIVE METABOLIC PANEL
ALT: 18 U/L (ref 0–53)
AST: 19 U/L (ref 0–37)
Albumin: 4.2 g/dL (ref 3.5–5.2)
Alkaline Phosphatase: 38 U/L — ABNORMAL LOW (ref 39–117)
BUN: 17 mg/dL (ref 6–23)
CO2: 28 mEq/L (ref 19–32)
Calcium: 9.1 mg/dL (ref 8.4–10.5)
Chloride: 105 mEq/L (ref 96–112)
Creatinine, Ser: 0.96 mg/dL (ref 0.40–1.50)
GFR: 81.97 mL/min (ref >60.00)
Glucose, Bld: 90 mg/dL (ref 70–99)
Potassium: 4.5 mEq/L (ref 3.5–5.1)
Sodium: 139 mEq/L (ref 135–145)
Total Bilirubin: 0.9 mg/dL (ref 0.2–1.2)
Total Protein: 6.7 g/dL (ref 6.0–8.3)

## 2022-03-22 LAB — TSH: TSH: 2.87 u[IU]/mL (ref 0.35–5.50)

## 2022-03-22 NOTE — Progress Notes (Signed)
? ?Established Patient Office Visit ? ?Subjective:  ?Patient ID: Harold Schwartz, male    DOB: 1956/06/18  Age: 66 y.o. MRN: [id number] ? ?CC:  ?Chief Complaint  ?Patient presents with  ? Atrial Fibrillation  ?  His fitbit alerted him he was in a fib about 2 weeks ago for the firs time. This past weekend after doing a lot of work and doing walking, on 03/18/22 he ended up feeling very exhausted and not feeling well and then his fitbit showed he was in afib again. He did have ablation in the past for afib.  ? ? ?HPI ?Harold Schwartz presents for Afib/fatigue ? ? ?Stats that his fit bit gave an afib warning, the first time was a couple weeks ago. States that it was on days that he worked harder and felt he was dehydrated.   ?States Sunday  he was working on rock channels in the back yard and went on a 4 mile walk after wards. He does the walk regularly. States that he went to climb the hill at the end of the walk and had a hard time. He experienced SHOB and over all fatigue.  States that the fit bit went off stating A-fib. States that he has felt fine since then. Does have a history of ablation  ? ?States that he did contact Dr. Rosann Auerbach office and they referred patient to PCP first.  ?Past Medical History:  ?Diagnosis Date  ? Atrial flutter ([place])   ? Colon polyps   ? Depression   ? DVT (deep vein thrombosis) in pregnancy   ? last one in March 2019 taking eliquis  ? Genital herpes   ? Heart murmur   ? Hemorrhoids   ? Hyperlipidemia   ? Hypogonadism in male   ? Neuromuscular disorder ([place])   ? slight numbnes in right foot  ? ? ?Past Surgical History:  ?Procedure Laterality Date  ? A-FLUTTER ABLATION N/A 03/22/2017  ? Procedure: A-Flutter Ablation;  Surgeon: Deboraha Sprang, MD;  Location: [facility];  Service: Cardiovascular;  Laterality: N/A;  ? COLONOSCOPY WITH PROPOFOL N/A 05/16/2018  ? Procedure: COLONOSCOPY WITH PROPOFOL;  Surgeon: Lucilla Lame, MD;  Location: [facility];  Service: Endoscopy;  Laterality: N/A;   ? KNEE ARTHROSCOPY    ? POLYPECTOMY  05/16/2018  ? Procedure: POLYPECTOMY;  Surgeon: Lucilla Lame, MD;  Location: [facility] [place];  Service: Endoscopy;;  ? TENOTOMY ACHILLES TENDON Right 08/22/2018  ? VASECTOMY  1990  ? ? ?Family History  ?Problem Relation Age of Onset  ? Breast cancer Mother   ? Heart disease Mother   ? Hyperlipidemia Mother   ? Hypertension Mother   ? Colon cancer Father   ? Heart disease Father   ? Alzheimer's disease Father   ? Hyperlipidemia Father   ? Multiple sclerosis Sister   ?     primary progessive MS  ? Hyperlipidemia Brother   ? Cancer Brother   ?     Pancreatic 2020  ? ? ?Social History  ? ?Socioeconomic History  ? Marital status: Married  ?  Spouse name: Not on file  ? Number of children: 2  ? Years of education: Not on file  ? Highest education level: Not on file  ?Occupational History  ? Occupation: Engineer, site  ?Tobacco Use  ? Smoking status: Never  ? Smokeless tobacco: Never  ?Vaping Use  ? Vaping Use: Never used  ?Substance and Sexual Activity  ? Alcohol use: Yes  ?  Alcohol/week: 7.0 standard drinks  ?  Types: 7 Shots of liquor per week  ?  Comment: moderate  ? Drug use: No  ? Sexual activity: Not on file  ?Other Topics Concern  ? Not on file  ?Social History Narrative  ? Not on file  ? ?Social Determinants of Health  ? ?Financial Resource Strain: Not on file  ?Food Insecurity: Not on file  ?Transportation Needs: Not on file  ?Physical Activity: Not on file  ?Stress: Not on file  ?Social Connections: Not on file  ?Intimate Partner Violence: Not on file  ? ? ?Outpatient Medications Prior to Visit  ?Medication Sig Dispense Refill  ? apixaban (ELIQUIS) 5 MG TABS tablet Take 1 tablet (5 mg total) by mouth 2 (two) times daily. 180 tablet 1  ? B-D 3CC LUER-LOK SYR 22GX1" 22G X 1" 3 ML MISC USE AS DIRECTED BY PROVIDER 12 each 11  ? BD HYPODERMIC NEEDLE 18G X 1" MISC USE AS DIRECTED BY PROVIDER 12 each 10  ? Celery Seed OIL Take 1 capsule by mouth in the morning and at  bedtime.     ? Cholecalciferol (VITAMIN D3) 50 MCG (2000 UT) CAPS Take 6,000 Units by mouth.    ? ezetimibe (ZETIA) 10 MG tablet Take 1 tablet (10 mg total) by mouth daily. 90 tablet 1  ? finasteride (PROSCAR) 5 MG tablet Take 1 tablet (5 mg total) by mouth daily. 90 tablet 3  ? glucosamine-chondroitin 500-400 MG tablet Take 1 tablet by mouth 2 (two) times daily.    ? Magnesium 300 MG CAPS Take 300 mg by mouth at bedtime.     ? Omega-3 Fatty Acids (FISH OIL) 1200 MG CAPS Take 2,400-3,600 capsules by mouth daily. Take 2,400 mg by mouth in the morning and 3,600 mg in the evening    ? silodosin (RAPAFLO) 8 MG CAPS capsule Take 1 capsule (8 mg total) by mouth daily with breakfast. 90 capsule 3  ? SYRINGE-NEEDLE, DISP, 3 ML (B-D 3CC LUER-LOK SYR 23GX1") 23G X 1" 3 ML MISC USE AS DIRECTED BY PROVIDER 12 each 5  ? tadalafil (CIALIS) 20 MG tablet TAKE ONE TABLET BY MOUTH DAILY AS NEEDED FOR ERECTILE DYSFUNCTION 30 tablet 3  ? testosterone cypionate (DEPOTESTOSTERONE CYPIONATE) 200 MG/ML injection INJECT 0.5ML INTRAMUSCULARLY ONCE A WEEK 6 mL 3  ? valACYclovir (VALTREX) 1000 MG tablet Take 500 mg by mouth daily.    ? vitamin E 180 MG (400 UNITS) capsule Take 400 Units by mouth daily.    ? cyclobenzaprine (FLEXERIL) 10 MG tablet Take 1 tablet (10 mg total) by mouth 3 times/day as needed-between meals & bedtime for muscle spasms. 30 tablet 0  ? guaiFENesin (MUCINEX) 600 MG 12 hr tablet Take 1 tablet (600 mg total) by mouth 2 (two) times daily. 6 tablet 0  ? ?No facility-administered medications prior to visit.  ? ? ?No Known Allergies ? ?ROS ?Review of Systems  ?Constitutional:  Negative for chills, fatigue and fever.  ?Respiratory:  Negative for shortness of breath.   ?Cardiovascular:  Positive for palpitations. Negative for chest pain.  ?Neurological:  Positive for light-headedness and headaches. Negative for dizziness.  ? ?  ?Objective:  ?  ?Physical Exam ?Vitals and nursing note reviewed.  ?Constitutional:   ?    Appearance: Normal appearance.  ?Neck:  ?   Thyroid: No thyroid mass, thyromegaly or thyroid tenderness.  ?Cardiovascular:  ?   Rate and Rhythm: Normal rate and regular rhythm.  ?   Heart  sounds: Normal heart sounds.  ?Pulmonary:  ?   Effort: Pulmonary effort is normal.  ?   Breath sounds: Normal breath sounds.  ?Abdominal:  ?   General: Bowel sounds are normal.  ?Musculoskeletal:  ?   Right lower leg: No edema.  ?   Left lower leg: No edema.  ?Lymphadenopathy:  ?   Cervical: No cervical adenopathy.  ?Neurological:  ?   Mental Status: He is alert.  ? ? ?BP 112/70   Pulse 72   Temp (!) 97.4 ?F (36.3 ?C)   Resp 12   Ht $R'5\' 11"'ZD$  (1.803 m)   Wt 198 lb 8 oz (90 kg)   SpO2 97%   BMI 27.69 kg/m?  ?Wt Readings from Last 3 Encounters:  ?03/22/22 198 lb 8 oz (90 kg)  ?02/02/22 188 lb (85.3 kg)  ?10/27/21 201 lb (91.2 kg)  ? ? ? ?Health Maintenance Due  ?Topic Date Due  ? Zoster Vaccines- Shingrix (1 of 2) Never done  ? Pneumonia Vaccine 30+ Years old (2 - PPSV23 if available, else PCV20) 12/19/2021  ? ? ?There are no preventive care reminders to display for this patient. ? ?Lab Results  ?Component Value Date  ? TSH 2.03 06/30/2018  ? ?Lab Results  ?Component Value Date  ? WBC 3.7 (L) 10/27/2021  ? HGB 15.0 10/27/2021  ? HCT 45.1 10/27/2021  ? MCV 94.4 10/27/2021  ? PLT 131.0 (L) 10/27/2021  ? ?Lab Results  ?Component Value Date  ? NA 138 10/27/2021  ? K 4.5 10/27/2021  ? CO2 30 10/27/2021  ? GLUCOSE 88 10/27/2021  ? BUN 19 10/27/2021  ? CREATININE 1.07 10/27/2021  ? BILITOT 0.9 10/27/2021  ? ALKPHOS 37 (L) 10/27/2021  ? AST 22 10/27/2021  ? ALT 20 10/27/2021  ? PROT 6.9 10/27/2021  ? ALBUMIN 4.0 10/27/2021  ? CALCIUM 8.9 10/27/2021  ? ANIONGAP 10 07/23/2020  ? EGFR 80 02/24/2021  ? GFR 72.17 10/27/2021  ? ?Lab Results  ?Component Value Date  ? CHOL 175 10/27/2021  ? ?Lab Results  ?Component Value Date  ? HDL 73.70 10/27/2021  ? ?Lab Results  ?Component Value Date  ? [place] 90 10/27/2021  ? ?Lab Results  ?Component Value  Date  ? TRIG 58.0 10/27/2021  ? ?Lab Results  ?Component Value Date  ? CHOLHDL 2 10/27/2021  ? ?Lab Results  ?Component Value Date  ? HGBA1C 5.5 09/15/2019  ? ? ?  ?Assessment & Plan:  ? ?Problem List Items Addressed This

## 2022-03-22 NOTE — Patient Instructions (Signed)
Nice to see you today ?I will be in touch with the lab results once I have them ?Follow up as scheduled ?If the dysrhythmia happens again notify me via my chart. If it does not resolve be seen ?

## 2022-03-22 NOTE — Assessment & Plan Note (Addendum)
Several episodes of palpitations per patient report. States that his smart watch alerted him that he was in AFIB. He stated the pulse range was in between 55-150. He was symptomatic. Check labs today. EKG WNL. Follow up if reoccurs  ? ?In the event of going into and out of an atrial fib/flutter patient is already anticoagulated due to Chronic DVT ?

## 2022-03-22 NOTE — Assessment & Plan Note (Signed)
History of PAF and underwent an ablation with Dr cline. He followed up and was cleared by cardiology ?

## 2022-03-23 ENCOUNTER — Ambulatory Visit (INDEPENDENT_AMBULATORY_CARE_PROVIDER_SITE_OTHER): Payer: Medicare PPO | Admitting: Urology

## 2022-03-23 VITALS — BP 134/77 | HR 76

## 2022-03-23 DIAGNOSIS — E291 Testicular hypofunction: Secondary | ICD-10-CM

## 2022-03-23 MED ORDER — JATENZO 158 MG PO CAPS: 1.0000 | ORAL_CAPSULE | Freq: Two times a day (BID) | ORAL | 5 refills | Status: DC

## 2022-03-23 NOTE — Progress Notes (Signed)
? ?03/23/2022 ?9:58 AM  ? ?Harold Schwartz ?09-07-1956 ?[id number] ? ?Referring provider: Michela Pitcher, NP ?[place] ?[place],   [id number] ? ?Followup hypogonadism ? ?HPI: ?[name] is a 66yo here for followup for hypogonadism. He has tried testosterone gels which failed to increase his testosterone. He is currently on IM testosterone. He does not want to continue injections. No other complaints today. ? ? ?PMH: ?Past Medical History:  ?Diagnosis Date  ? Atrial flutter ([place])   ? Colon polyps   ? Depression   ? DVT (deep vein thrombosis) in pregnancy   ? last one in March 2019 taking eliquis  ? Genital herpes   ? Heart murmur   ? Hemorrhoids   ? Hyperlipidemia   ? Hypogonadism in male   ? Neuromuscular disorder ([place])   ? slight numbnes in right foot  ? ? ?Surgical History: ?Past Surgical History:  ?Procedure Laterality Date  ? A-FLUTTER ABLATION N/A 03/22/2017  ? Procedure: A-Flutter Ablation;  Surgeon: Deboraha Sprang, MD;  Location: [facility];  Service: Cardiovascular;  Laterality: N/A;  ? COLONOSCOPY WITH PROPOFOL N/A 05/16/2018  ? Procedure: COLONOSCOPY WITH PROPOFOL;  Surgeon: Lucilla Lame, MD;  Location: [facility];  Service: Endoscopy;  Laterality: N/A;  ? KNEE ARTHROSCOPY    ? POLYPECTOMY  05/16/2018  ? Procedure: POLYPECTOMY;  Surgeon: Lucilla Lame, MD;  Location: [facility];  Service: Endoscopy;;  ? TENOTOMY ACHILLES TENDON Right 08/22/2018  ? VASECTOMY  1990  ? ? ?Home Medications:  ?Allergies as of 03/23/2022   ?No Known Allergies ?  ? ?  ?Medication List  ?  ? ?  ? Accurate as of March 23, 2022  9:58 AM. If you have any questions, ask your nurse or doctor.  ?  ?  ? ?  ? ?apixaban 5 MG Tabs tablet ?Commonly known as: Eliquis ?Take 1 tablet (5 mg total) by mouth 2 (two) times daily. ?  ?B-D 3CC LUER-LOK SYR 23GX1" 23G X 1" 3 ML Misc ?Generic drug: SYRINGE-NEEDLE (DISP) 3 ML ?USE AS DIRECTED BY PROVIDER ?  ?B-D 3CC LUER-LOK SYR 22GX1" 22G X 1" 3 ML Misc ?Generic drug:  SYRINGE-NEEDLE (DISP) 3 ML ?USE AS DIRECTED BY PROVIDER ?  ?BD Hypodermic Needle 18G X 1" Misc ?Generic drug: NEEDLE (DISP) 18 G ?USE AS DIRECTED BY PROVIDER ?  ?Celery Seed Oil ?Take 1 capsule by mouth in the morning and at bedtime. ?  ?ezetimibe 10 MG tablet ?Commonly known as: ZETIA ?Take 1 tablet (10 mg total) by mouth daily. ?  ?finasteride 5 MG tablet ?Commonly known as: PROSCAR ?Take 1 tablet (5 mg total) by mouth daily. ?  ?Fish Oil 1200 MG Caps ?Take 2,400-3,600 capsules by mouth daily. Take 2,400 mg by mouth in the morning and 3,600 mg in the evening ?  ?glucosamine-chondroitin 500-400 MG tablet ?Take 1 tablet by mouth 2 (two) times daily. ?  ?Magnesium 300 MG Caps ?Take 300 mg by mouth at bedtime. ?  ?silodosin 8 MG Caps capsule ?Commonly known as: RAPAFLO ?Take 1 capsule (8 mg total) by mouth daily with breakfast. ?  ?tadalafil 20 MG tablet ?Commonly known as: CIALIS ?TAKE ONE TABLET BY MOUTH DAILY AS NEEDED FOR ERECTILE DYSFUNCTION ?  ?testosterone cypionate 200 MG/ML injection ?Commonly known as: DEPOTESTOSTERONE CYPIONATE ?INJECT 0.5ML INTRAMUSCULARLY ONCE A WEEK ?  ?valACYclovir 1000 MG tablet ?Commonly known as: VALTREX ?Take 500 mg by mouth daily. ?  ?vitamin D3 50 MCG (2000 UT) Caps ?  Take 6,000 Units by mouth. ?  ?vitamin E 180 MG (400 UNITS) capsule ?Take 400 Units by mouth daily. ?  ? ?  ? ? ?Allergies: No Known Allergies ? ?Family History: ?Family History  ?Problem Relation Age of Onset  ? Breast cancer Mother   ? Heart disease Mother   ? Hyperlipidemia Mother   ? Hypertension Mother   ? Colon cancer Father   ? Heart disease Father   ? Alzheimer's disease Father   ? Hyperlipidemia Father   ? Multiple sclerosis Sister   ?     primary progessive MS  ? Hyperlipidemia Brother   ? Cancer Brother   ?     Pancreatic 2020  ? ? ?Social History:  reports that he has never smoked. He has never used smokeless tobacco. He reports current alcohol use of about 7.0 standard drinks per week. He reports that he  does not use drugs. ? ?ROS: ?All other review of systems were reviewed and are negative except what is noted above in HPI ? ?Physical Exam: ?BP 134/77   Pulse 76   ?Constitutional:  Alert and oriented, No acute distress. ?HEENT: [place] AT, moist mucus membranes.  Trachea midline, no masses. ?Cardiovascular: No clubbing, cyanosis, or edema. ?Respiratory: Normal respiratory effort, no increased work of breathing. ?GI: Abdomen is soft, nontender, nondistended, no abdominal masses ?GU: No CVA tenderness.  ?Lymph: No cervical or inguinal lymphadenopathy. ?Skin: No rashes, bruises or suspicious lesions. ?Neurologic: Grossly intact, no focal deficits, moving all 4 extremities. ?Psychiatric: Normal mood and affect. ? ?Laboratory Data: ?Lab Results  ?Component Value Date  ? WBC 4.1 03/22/2022  ? HGB 15.1 03/22/2022  ? HCT 43.9 03/22/2022  ? MCV 94.1 03/22/2022  ? PLT 133.0 (L) 03/22/2022  ? ? ?Lab Results  ?Component Value Date  ? CREATININE 0.96 03/22/2022  ? ? ?Lab Results  ?Component Value Date  ? PSA 3.61 10/21/2017  ? PSA 3.47 06/29/2016  ? PSA 2.34 04/18/2015  ? ? ?Lab Results  ?Component Value Date  ? TESTOSTERONE 631 02/24/2021  ? ? ?Lab Results  ?Component Value Date  ? HGBA1C 5.5 09/15/2019  ? ? ?Urinalysis ?   ?Component Value Date/Time  ? APPEARANCEUR Clear 02/02/2022 1137  ? GLUCOSEU Negative 02/02/2022 1137  ? BILIRUBINUR Negative 02/02/2022 1137  ? PROTEINUR Negative 02/02/2022 1137  ? NITRITE Negative 02/02/2022 1137  ? LEUKOCYTESUR Negative 02/02/2022 1137  ? ? ?Lab Results  ?Component Value Date  ? LABMICR Comment 02/02/2022  ? ? ?Pertinent Imaging: ? ?No results found for this or any previous visit. ? ?No results found for this or any previous visit. ? ?No results found for this or any previous visit. ? ?No results found for this or any previous visit. ? ?No results found for this or any previous visit. ? ?No results found for this or any previous visit. ? ?No results found for this or any previous  visit. ? ?No results found for this or any previous visit. ? ? ?Assessment & Plan:   ? ?1. Male hypogonadism ?-Jatenzo '158mg'$  BID ?-RTC 3 months with testosterone labs ? ? ?No follow-ups on file. ? ?Nicolette Bang, MD ? ?[facility] ?  ?

## 2022-03-30 ENCOUNTER — Encounter: Payer: Self-pay | Admitting: Urology

## 2022-04-03 ENCOUNTER — Encounter: Payer: Self-pay | Admitting: Urology

## 2022-04-03 NOTE — Patient Instructions (Signed)
Testosterone Capsules ?What is this medication? ?TESTOSTERONE (tes TOS ter one) is used to increase testosterone levels in your body. It belongs to a group of medications called androgen hormones. ?This medicine may be used for other purposes; ask your health care provider or pharmacist if you have questions. ?COMMON BRAND NAME(S): Gaynelle Cage, KYZATREX ?What should I tell my care team before I take this medication? ?They need to know if you have any of these conditions: ?Cancer ?Depression ?Diabetes ?Heart disease ?High blood pressure ?High red blood cell count ?Kidney disease ?Liver disease ?Lung or breathing disease ?Prostate disease ?Suicidal thoughts, plans, or attempt; a previous suicide attempt by you or a family member ?An unusual or allergic reaction to testosterone, other medications, foods, dyes, or preservatives ?If a male partner is pregnant or trying to get pregnant ?Breast-feeding ?How should I use this medication? ?Take this medication by mouth with a glass of water. Follow the directions on the prescription label. Take this medication with food. Take your doses at regular intervals. Do not take your medication more often than directed. Do not stop taking except on your care team's advice. ?A special MedGuide will be given to you by the pharmacist with each prescription and refill. Be sure to read this information carefully each time. ?Talk to your care team regarding the use of this medication in children. Special care may be needed. ?Overdosage: If you think you have taken too much of this medicine contact a poison control center or emergency room at once. ?NOTE: This medicine is only for you. Do not share this medicine with others. ?What if I miss a dose? ?If you miss a dose, take it as soon as you can. If it is almost time for your next dose, take only that dose. Do not take double or extra doses. Call your care team if you are not sure how to handle a missed dose. ?What may interact with this  medication? ?Certain medications for colds or congestion, like ephedrine, phenylephrine, or pseudoephedrine ?Certain medications that treat or prevent blood clots like warfarin ?Medications for diabetes ?Steroid medications like prednisone or cortisone ?This list may not describe all possible interactions. Give your health care provider a list of all the medicines, herbs, non-prescription drugs, or dietary supplements you use. Also tell them if you smoke, drink alcohol, or use illegal drugs. Some items may interact with your medicine. ?What should I watch for while using this medication? ?Visit your care team for regular checks on your progress. They will need to check the level of testosterone in your blood. ?This medication is only approved for use in men who have low levels of testosterone related to certain medical conditions. Heart attacks and strokes have been reported with the use of this medication. Notify your care team and seek emergency treatment if you develop breathing problems; changes in vision; confusion; chest pain or chest tightness; sudden arm pain; severe, sudden headache; trouble speaking or understanding; sudden numbness or weakness of the face, arm or leg; loss of balance or coordination. Talk to your care team about the risks and benefits of this medication. ?This medication may affect blood sugar levels. If you have diabetes, check with your care team before you change your diet or the dose of your diabetic medication. ?This medication is banned from use in athletes by most athletic organizations. ?What side effects may I notice from receiving this medication? ?Side effects that you should report to your care team as soon as possible: ?Allergic reactions--skin rash, itching,  hives, swelling of the face, lips, tongue, or throat ?Blood clot--pain, swelling, or warmth in the leg, shortness of breath, chest pain ?Heart attack--pain or tightness in the chest, shoulders, arms, or jaw, nausea,  shortness of breath, cold or clammy skin, feeling faint or lightheaded ?Increase in blood pressure ?Liver injury--right upper belly pain, loss of appetite, nausea, light-colored stool, dark yellow or brown urine, yellowing skin or eyes, unusual weakness or fatigue ?Mood swings, irritability, or hostility ?Prolonged or painful erection ?Sleep apnea--loud snoring, gasping during sleep, daytime sleepiness ?Stroke--sudden numbness or weakness of the face, arm, or leg, trouble speaking, confusion, trouble walking, loss of balance or coordination, dizziness, severe headache, change in vision ?Swelling of the ankles, hands or feet ?Thoughts of suicide or self-harm, worsening mood, feelings of depression ?Side effects that usually do not require medical attention (report these to your care team if they continue or are bothersome): ?Acne ?Burping ?Change in sex drive or performance ?Diarrhea ?Unexpected breast tissue growth ?Upset stomach ?This list may not describe all possible side effects. Call your doctor for medical advice about side effects. You may report side effects to FDA at 1-800-FDA-1088. ?Where should I keep my medication? ?Keep out of the reach of children and pets. ?Store between 15 and 30 degrees C (59 and 86 degrees F). Avoid exposing the capsules to moisture (store in a dry place). Throw away any unused medication after the expiration date on the label. ?NOTE: This sheet is a summary. It may not cover all possible information. If you have questions about this medicine, talk to your doctor, pharmacist, or health care provider. ?? 2023 Elsevier/Gold Standard (2020-11-30 00:00:00) ? ?

## 2022-04-27 ENCOUNTER — Encounter: Payer: Self-pay | Admitting: Nurse Practitioner

## 2022-04-27 ENCOUNTER — Ambulatory Visit (INDEPENDENT_AMBULATORY_CARE_PROVIDER_SITE_OTHER): Payer: Medicare PPO | Admitting: Nurse Practitioner

## 2022-04-27 VITALS — BP 104/68 | HR 70 | Temp 97.6°F | Resp 14 | Ht 71.0 in | Wt 197.6 lb

## 2022-04-27 DIAGNOSIS — A6001 Herpesviral infection of penis: Secondary | ICD-10-CM

## 2022-04-27 DIAGNOSIS — N401 Enlarged prostate with lower urinary tract symptoms: Secondary | ICD-10-CM

## 2022-04-27 DIAGNOSIS — I825Y2 Chronic embolism and thrombosis of unspecified deep veins of left proximal lower extremity: Secondary | ICD-10-CM

## 2022-04-27 DIAGNOSIS — E291 Testicular hypofunction: Secondary | ICD-10-CM | POA: Diagnosis not present

## 2022-04-27 DIAGNOSIS — Z0000 Encounter for general adult medical examination without abnormal findings: Secondary | ICD-10-CM | POA: Diagnosis not present

## 2022-04-27 DIAGNOSIS — E781 Pure hyperglyceridemia: Secondary | ICD-10-CM | POA: Diagnosis not present

## 2022-04-27 DIAGNOSIS — N138 Other obstructive and reflux uropathy: Secondary | ICD-10-CM

## 2022-04-27 NOTE — Assessment & Plan Note (Signed)
Continue taking medication as prescribed of Zetia fish oils.  Last cholesterol panel within normal limits.  Continue medications as prescribed ?

## 2022-04-27 NOTE — Assessment & Plan Note (Signed)
Followed by Dr. Nicolette Bang and undergoing testosterone replacement.  Continue taking medication as prescribed and follow-up with urologist as recommended ?

## 2022-04-27 NOTE — Assessment & Plan Note (Signed)
Patient currently maintained on valacyclovir 500 mg daily.  Continue medication as prescribed ?

## 2022-04-27 NOTE — Assessment & Plan Note (Addendum)
Patient followed by Dr. Nicolette Bang.  Patient currently on a silodosin and finasteride.  Continue taking medication as prescribed follow-up with Dr. Alyson Ingles, urologist as recommended. ?

## 2022-04-27 NOTE — Patient Instructions (Signed)
Nice to see you today ?You can try some hydrocortisone on the places on your back. Get your spouse to keep a check on them and make sure there is no change ?Follow up with me again in 6 months ? ?

## 2022-04-27 NOTE — Progress Notes (Signed)
? ?Established Patient Office Visit ? ?Subjective   ?Patient ID: Harold Schwartz, male    DOB: 04-Jan-1956  Age: 66 y.o. MRN: [id number] ? ?Chief Complaint  ?Patient presents with  ? Medicare Wellness  ? ? ?HPI ?for complete physical and follow up of chronic conditions. ? ?Immunizations: ?-Tetanus:2016 ?-Influenza: out of season 09/08/2021 ?-Covid-19:pfizer x2 and 3 boosters ?-Shingles: Up to date ?-Pneumonia: up to date will require updated  prevnar in 2027 ? ?-HPV: aged out ? ?Diet: Fair diet. 3 meals a day. Snack throughout the day. Will eat nouts fruits and popcorn. Coffee in the am and water. Will have kombucha ?Exercise: walks and stretched daily for approx 60 mins a day ? ?Eye exam: Dr. Joya San with [facility] ?Dental exam: Completes semi-annually  ? ? ?Colonoscopy: Completed in 2019 with a 5 year recall ?Dexa: NA ?PSA: 03/2022 with Dr. Maryruth Eve urology ? ?Lung Cancer Screening: NA ? ?Sleep: Bed by 10 pm and gets up around 730. Feels rested. Not a snorer ? ?Hearing Screening  ? '500Hz'$  '1000Hz'$  '2000Hz'$  '4000Hz'$   ?Right ear '25 25 25 25  '$ ?Left ear '25 25 25 25  '$ ? ?Vision Screening  ? Right eye Left eye Both eyes  ?Without correction     ?With correction '20/15 20/13 20/13 '$  ?Comments: Saw all colors correct   ? ? ?  04/27/2022  ? 10:03 AM 03/22/2022  ?  8:07 AM 12/19/2020  ? 12:20 PM  ?PHQ9 SCORE ONLY  ?PHQ-9 Total Score 0 0 0  ?  ? ? ?Review of Systems  ?Constitutional:  Negative for chills, fever and malaise/fatigue.  ?Respiratory:  Negative for cough and shortness of breath.   ?Cardiovascular:  Negative for chest pain, palpitations and leg swelling.  ?Gastrointestinal:   ?     BM dialy ?  ?Genitourinary:  Negative for dysuria and hematuria.  ?     Nocturia x1  ?Neurological:  Positive for weakness (right leg to weaker the left baseline). Negative for dizziness, tingling and headaches.  ?Psychiatric/Behavioral:  Negative for hallucinations and suicidal ideas.   ? ?  ?Objective:  ?  ? ?BP 104/68   Pulse 70    Temp 97.6 ?F (36.4 ?C)   Resp 14   Ht '5\' 11"'$  (1.803 m)   Wt 197 lb 9 oz (89.6 kg)   SpO2 96%   BMI 27.55 kg/m?  ?BP Readings from Last 3 Encounters:  ?04/27/22 104/68  ?03/23/22 134/77  ?03/22/22 112/70  ? ?Wt Readings from Last 3 Encounters:  ?04/27/22 197 lb 9 oz (89.6 kg)  ?03/22/22 198 lb 8 oz (90 kg)  ?02/02/22 188 lb (85.3 kg)  ? ?  ? ?Physical Exam ?Vitals and nursing note reviewed.  ?Constitutional:   ?   Appearance: Normal appearance.  ?HENT:  ?   Right Ear: Tympanic membrane, ear canal and external ear normal.  ?   Left Ear: Tympanic membrane, ear canal and external ear normal.  ?   Mouth/Throat:  ?   Mouth: Mucous membranes are moist.  ?   Pharynx: Oropharynx is clear.  ?Eyes:  ?   Extraocular Movements: Extraocular movements intact.  ?   Pupils: Pupils are equal, round, and reactive to light.  ?   Comments: Wears corrective lenses ?  ?Neck:  ?   Thyroid: No thyroid mass, thyromegaly or thyroid tenderness.  ?Cardiovascular:  ?   Rate and Rhythm: Normal rate and regular rhythm.  ?   Pulses: Normal pulses.  ?  Heart sounds: Normal heart sounds.  ?Pulmonary:  ?   Effort: Pulmonary effort is normal.  ?   Breath sounds: Normal breath sounds.  ?Abdominal:  ?   General: Bowel sounds are normal. There is no distension.  ?   Palpations: There is no mass.  ?   Tenderness: There is no abdominal tenderness.  ?   Hernia: No hernia is present.  ?Musculoskeletal:  ?   Right lower leg: No edema.  ?   Left lower leg: No edema.  ?Lymphadenopathy:  ?   Cervical: No cervical adenopathy.  ?Skin: ?   General: Skin is warm.  ?Neurological:  ?   General: No focal deficit present.  ?   Mental Status: He is alert.  ?   Deep Tendon Reflexes:  ?   Reflex Scores: ?     Bicep reflexes are 2+ on the right side and 2+ on the left side. ?     Patellar reflexes are 2+ on the right side and 2+ on the left side. ?   Comments: Bilateral upper and lower extremity strength 5/5  ? ? ? ?No results found for any visits on  04/27/22. ? ? ? ?The 10-year ASCVD risk score (Arnett DK, et al., 2019) is: 7.8% ? ?  ?Assessment & Plan:  ? ?Problem List Items Addressed This Visit   ? ?  ? Cardiovascular and Mediastinum  ? Chronic deep vein thrombosis (DVT) of proximal vein of left lower extremity ([place])  ?  Currently maintained on Eliquis 5 mg twice daily.  Continue taking medication as prescribed ? ?  ?  ?  ? Endocrine  ? Male hypogonadism  ?  Followed by Dr. Nicolette Bang and undergoing testosterone replacement.  Continue taking medication as prescribed and follow-up with urologist as recommended ? ?  ?  ?  ? Genitourinary  ? Genital herpes  ?  Patient currently maintained on valacyclovir 500 mg daily.  Continue medication as prescribed ? ?  ?  ? Benign prostatic hyperplasia with urinary obstruction  ?  Patient followed by Dr. Nicolette Bang.  Patient currently on a silodosin and finasteride.  Continue taking medication as prescribed follow-up with Dr. Alyson Ingles, urologist as recommended. ? ?  ?  ?  ? Other  ? Hypertriglyceridemia  ?  Continue taking medication as prescribed of Zetia fish oils.  Last cholesterol panel within normal limits.  Continue medications as prescribed ? ?  ?  ? Medicare annual wellness visit, subsequent - Primary  ?  Did review Medicare annual wellness paperwork with patient in office did perform modified Mini-Mental exam paper signed and sent to be scanned into patient's medical record ? ?  ?  ? ? ?Return in about 6 months (around 10/28/2022) for CPE and labs.  ? ? ?Romilda Garret, NP ? ?

## 2022-04-27 NOTE — Assessment & Plan Note (Signed)
Currently maintained on Eliquis 5 mg twice daily.  Continue taking medication as prescribed ?

## 2022-04-27 NOTE — Assessment & Plan Note (Signed)
Did review Medicare annual wellness paperwork with patient in office did perform modified Mini-Mental exam paper signed and sent to be scanned into patient's medical record ?

## 2022-05-29 ENCOUNTER — Ambulatory Visit
Admission: EM | Admit: 2022-05-29 | Discharge: 2022-05-29 | Disposition: A | Discharge status: Home / Self Care | Payer: Medicare PPO | Attending: Emergency Medicine | Admitting: Emergency Medicine

## 2022-05-29 DIAGNOSIS — R300 Dysuria: Secondary | ICD-10-CM

## 2022-05-29 LAB — POCT URINALYSIS DIP (MANUAL ENTRY)
Bilirubin, UA: NEGATIVE
Blood, UA: NEGATIVE
Glucose, UA: NEGATIVE mg/dL
Ketones, POC UA: NEGATIVE mg/dL
Leukocytes, UA: NEGATIVE
Nitrite, UA: NEGATIVE
Protein Ur, POC: NEGATIVE mg/dL
Spec Grav, UA: 1.02 (ref 1.010–1.025)
Urobilinogen, UA: 0.2 E.U./dL
pH, UA: 6.5 (ref 5.0–8.0)

## 2022-05-29 NOTE — ED Provider Notes (Signed)
[facility]    CSN: [id number] Arrival date & time: 05/29/22  1324      History   Chief Complaint Chief Complaint  Patient presents with   Dysuria    HPI Harold Schwartz is a 66 y.o. male.  Patient presents with dysuria x2 days.  He describes this as discomfort before urinating and while urinating.  No difficulty initiating or maintaining a stream.  He also reports suprapubic discomfort x1 day.  No fever, chills, hematuria, penile discharge, testicular pain, nausea, vomiting, diarrhea, constipation, or other symptoms.  No treatment at home.  His medical history includes BPH and hypogonadism.  Last seen by urology on 03/23/2022.  The history is provided by the patient and medical records.    Past Medical History:  Diagnosis Date   Atrial flutter ([place])    Colon polyps    Depression    DVT (deep vein thrombosis) in pregnancy    last one in March 2019 taking eliquis   Genital herpes    Heart murmur    Hemorrhoids    Hyperlipidemia    Hypogonadism in male    Neuromuscular disorder ([place])    slight numbnes in right foot    Patient Active Problem List   Diagnosis Date Noted   Palpitations 03/22/2022   Decreased GFR 10/27/2021   Thrombocytopenia ([place]) 10/27/2021   Medicare annual wellness visit, subsequent 10/27/2021   Testicular mass 10/03/2021   Nocturia 08/17/2020   Erectile dysfunction due to arterial insufficiency 08/17/2020   Benign prostatic hyperplasia with urinary obstruction 03/23/2019   Chronic deep vein thrombosis (DVT) of proximal vein of left lower extremity ([place]) 11/21/2016   Genital herpes 04/25/2015   Male hypogonadism 04/22/2014   Hypertriglyceridemia 04/22/2014   Paroxysmal atrial flutter ([place]) 04/22/2014    Past Surgical History:  Procedure Laterality Date   A-FLUTTER ABLATION N/A 03/22/2017   Procedure: A-Flutter Ablation;  Surgeon: Deboraha Sprang, MD;  Location: [facility];  Service: Cardiovascular;  Laterality: N/A;   COLONOSCOPY  WITH PROPOFOL N/A 05/16/2018   Procedure: COLONOSCOPY WITH PROPOFOL;  Surgeon: Lucilla Lame, MD;  Location: [facility];  Service: Endoscopy;  Laterality: N/A;   KNEE ARTHROSCOPY     POLYPECTOMY  05/16/2018   Procedure: POLYPECTOMY;  Surgeon: Lucilla Lame, MD;  Location: [facility];  Service: Endoscopy;;   TENOTOMY ACHILLES TENDON Right 08/22/2018   VASECTOMY  1990       Home Medications    Prior to Admission medications   Medication Sig Start Date End Date Taking? Authorizing Provider  apixaban (ELIQUIS) 5 MG TABS tablet Take 1 tablet (5 mg total) by mouth 2 (two) times daily. 10/27/21   Michela Pitcher, NP  Celery Seed OIL Take 1 capsule by mouth in the morning and at bedtime.     [provider]  Cholecalciferol (VITAMIN D3) 50 MCG (2000 UT) CAPS Take 6,000 Units by mouth.    [provider]  ezetimibe (ZETIA) 10 MG tablet Take 1 tablet (10 mg total) by mouth daily. 10/27/21   Michela Pitcher, NP  finasteride (PROSCAR) 5 MG tablet Take 1 tablet (5 mg total) by mouth daily. 02/02/22   McKenzie, Candee Furbish, MD  glucosamine-chondroitin 500-400 MG tablet Take 1 tablet by mouth 2 (two) times daily.    [provider]  Magnesium 300 MG CAPS Take 300 mg by mouth at bedtime.     [provider]  Omega-3 Fatty Acids (FISH OIL) 1200 MG CAPS Take 2,400-3,600  capsules by mouth daily. Take 2,400 mg by mouth in the morning and 3,600 mg in the evening    [provider]  silodosin (RAPAFLO) 8 MG CAPS capsule Take 1 capsule (8 mg total) by mouth daily with breakfast. 02/02/22   McKenzie, Candee Furbish, MD  tadalafil (CIALIS) 20 MG tablet TAKE ONE TABLET BY MOUTH DAILY AS NEEDED FOR ERECTILE DYSFUNCTION 10/02/21   McKenzie, Candee Furbish, MD  Testosterone Undecanoate (JATENZO) 158 MG CAPS Take 1 capsule by mouth in the morning and at bedtime. 03/23/22   McKenzie, Candee Furbish, MD  valACYclovir (VALTREX) 1000 MG tablet Take 500 mg by mouth daily.    [provider]  vitamin E 180 MG (400 UNITS) capsule Take 400 Units by mouth daily.    [provider]    Family History Family History  Problem Relation Age of Onset   Breast cancer Mother    Heart disease Mother    Hyperlipidemia Mother    Hypertension Mother    Colon cancer Father    Heart disease Father    Alzheimer's disease Father    Hyperlipidemia Father    Multiple sclerosis Sister        primary progessive MS   Hyperlipidemia Brother    Cancer Brother 81       Pancreatic 2020    Social History Social History   Tobacco Use   Smoking status: Never   Smokeless tobacco: Never  Vaping Use   Vaping Use: Never used  Substance Use Topics   Alcohol use: Not Currently    Alcohol/week: 7.0 standard drinks of alcohol    Types: 7 Shots of liquor per week    Comment: moderate   Drug use: No     Allergies   Patient has no known allergies.   Review of Systems Review of Systems  Constitutional:  Negative for chills and fever.  Gastrointestinal:  Positive for abdominal pain. Negative for constipation, diarrhea and vomiting.  Genitourinary:  Positive for dysuria. Negative for frequency, hematuria, penile discharge and testicular pain.  Skin:  Negative for color change and rash.  All other systems reviewed and are negative.    Physical Exam Triage Vital Signs ED Triage Vitals  Enc Vitals Group     BP      Pulse      Resp      Temp      Temp src      SpO2      Weight      Height      Head Circumference      Peak Flow      Pain Score      Pain Loc      Pain Edu?      Excl. in [place]?    No data found.  Updated Vital Signs BP 138/88 (BP Location: Left Arm)   Pulse 69   Temp 97.7 F (36.5 C) (Temporal)   Resp 16   SpO2 96%   Visual Acuity Right Eye Distance:   Left Eye Distance:   Bilateral Distance:    Right Eye Near:   Left Eye Near:    Bilateral Near:     Physical Exam Vitals and nursing note reviewed.  Constitutional:       General: He is not in acute distress.    Appearance: Normal appearance. He is well-developed. He is not ill-appearing.  HENT:     Mouth/Throat:     Mouth: Mucous membranes are moist.  Cardiovascular:     Rate and Rhythm: Normal rate and regular rhythm.     Heart sounds: Normal heart sounds.  Pulmonary:     Effort: Pulmonary effort is normal. No respiratory distress.     Breath sounds: Normal breath sounds.  Abdominal:     General: Bowel sounds are normal.     Palpations: Abdomen is soft.     Tenderness: There is no abdominal tenderness. There is no right CVA tenderness, left CVA tenderness, guarding or rebound.  Musculoskeletal:     Cervical back: Neck supple.  Skin:    General: Skin is warm and dry.     Capillary Refill: Capillary refill takes less than 2 seconds.  Neurological:     Mental Status: He is alert.  Psychiatric:        Mood and Affect: Mood normal.        Behavior: Behavior normal.      UC Treatments / Results  Labs (all labs ordered are listed, but only abnormal results are displayed) Labs Reviewed  POCT URINALYSIS DIP (MANUAL ENTRY)    EKG   Radiology No results found.  Procedures Procedures (including critical care time)  Medications Ordered in UC Medications - No data to display  Initial Impression / Assessment and Plan / UC Course  I have reviewed the triage vital signs and the nursing notes.  Pertinent labs & imaging results that were available during my care of the patient were reviewed by me and considered in my medical decision making (see chart for details).    Dysuria.  Urine normal.  Patient denies concern for STD and declines testing. Education provided on dysuria.  Instructed patient to maintain good oral intake of water.  Instructed patient to follow up with his urologist or PCP.  ED precautions discussed.  He agrees to plan of care.    Final Clinical Impressions(s) / UC Diagnoses   Final diagnoses:  Dysuria     Discharge  Instructions      Follow up with your primary care provider or urologist.        ED Prescriptions   None    PDMP not reviewed this encounter.   Sharion Balloon, NP 05/29/22 1407

## 2022-05-29 NOTE — ED Triage Notes (Signed)
Patient presents to Urgent Care with complaints of dysuria x 2 days. Abdominal pain x 1 day. Concerned with UTI.   Denies fever or hematuria.

## 2022-05-29 NOTE — Discharge Instructions (Addendum)
Follow up with your primary care provider or urologist.

## 2022-05-30 ENCOUNTER — Encounter: Payer: Self-pay | Admitting: Urology

## 2022-05-30 NOTE — Telephone Encounter (Signed)
Wouldn't his symptoms return if he stops?

## 2022-06-15 IMAGING — MR MR PROSTATE WO/W CM
12 series · 48 of 48 positions shown · IV contrast (gadavist)
Comparison: None.

CLINICAL DATA: Elevated PSA level.  Benign biopsy 06/28/2021

EXAM:
MR PROSTATE WITHOUT AND WITH CONTRAST
TECHNIQUE: Multiplanar multisequence MRI images were obtained of the pelvis
centered about the prostate. Pre and post contrast images were
obtained.
CONTRAST:  9mL GADAVIST GADOBUTROL 1 MMOL/ML IV SOLN

[Series 3: T1 · axial · 5.0mm · 1.19mm/px · 1 of 72 slices shown (1 of 2)]
[im 1/72]
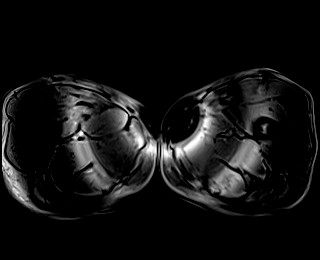

[Series 4: T1 · axial · 5.0mm · 1.19mm/px · 1 of 72 slices shown (2 of 2)]
[im 1/72]
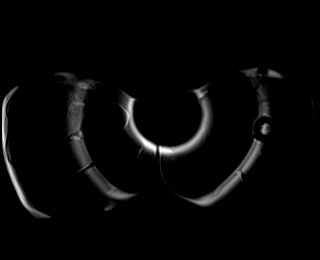

[Series 6: T2 · coronal · 3.0mm · 0.47mm/px · 1 of 24 slices shown (1 of 3)]
[im 1/24]
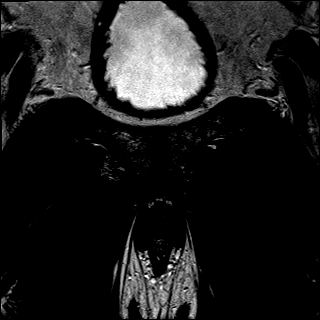

[Series 7: T2 · axial · 3.0mm · 0.47mm/px · 1 of 30 slices shown (2 of 3)]
[im 1/30]
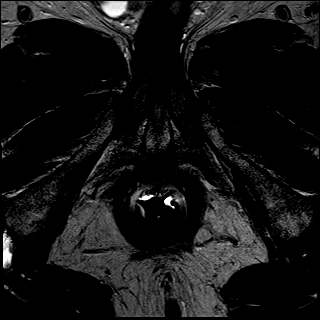

[Series 8: T2 · axial · 1.0mm · 1.00mm/px · z∈[-95,-2]mm · 3 of 96 slices shown (3 of 3)]
[im 1/96]
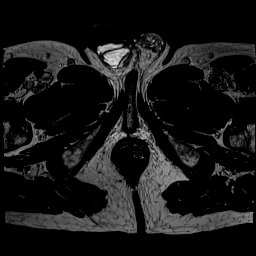
[im 48/96]
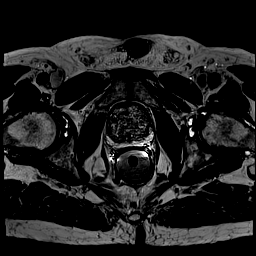
[im 96/96]
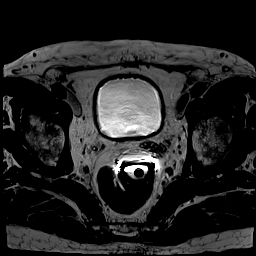

[Series 9: ep2d_diff_b100_500_800_tra_endo**_tracew_dfc · axial · 3.0mm · 1.60mm/px · z∈[-65,+12]mm · 2 of 75 slices shown]
[im 1/75]
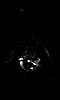
[im 75/75]
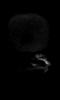

[Series 10: ep2d_diff_b100_500_800_tra_endo**_adc_dfc · axial · 3.0mm · 1.60mm/px · 1 of 27 slices shown]
[im 1/27]
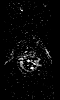

[Series 11: ep2d_diff_b100_500_800_tra_endo**_calc_bval_dfc · axial · 3.0mm · 1.60mm/px · 1 of 27 slices shown]
[im 1/27]
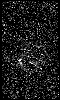

[Series 12: ep2d_diff_bvalue (id) · axial · 3.0mm · 1.60mm/px · 1 of 29 slices shown]
[im 1/29]
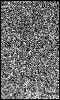

[Series 13: axial multiphase · axial · 3.0mm · 0.98mm/px · z∈[-77,+16]mm · 17 of 640 slices shown]
[im 1/640]
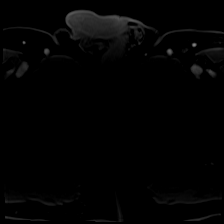
[im 40/640]
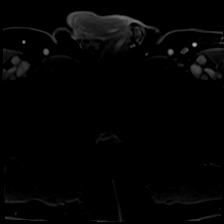
[im 80/640]
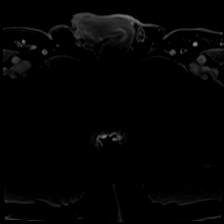
[im 120/640]
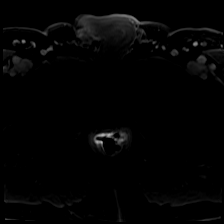
[im 160/640]
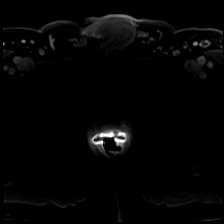
[im 200/640]
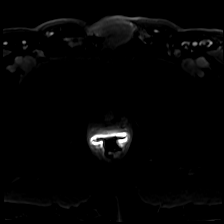
[im 240/640]
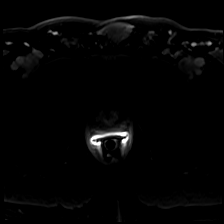
[im 280/640]
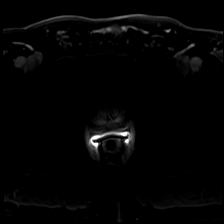
[im 320/640]
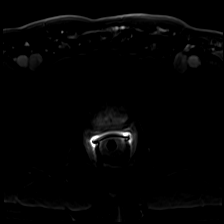
[im 360/640]
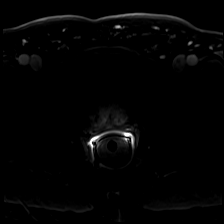
[im 400/640]
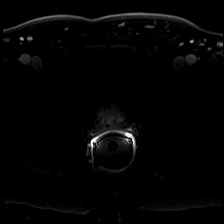
[im 440/640]
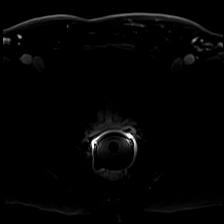
[im 480/640]
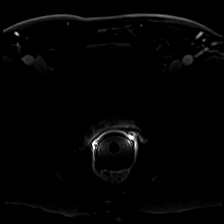
[im 520/640]
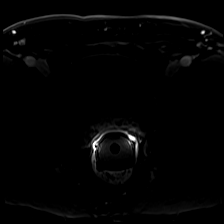
[im 560/640]
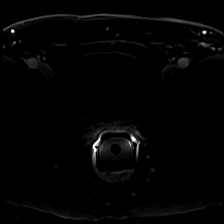
[im 600/640]
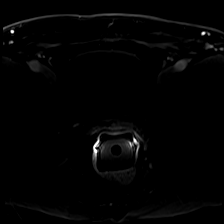
[im 640/640]
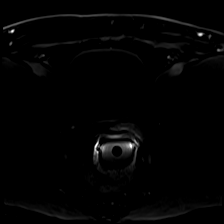

[Series 14: axial multiphase_sub · axial · 3.0mm · 0.98mm/px · z∈[-77,+16]mm · 16 of 597 slices shown]
[im 1/597]
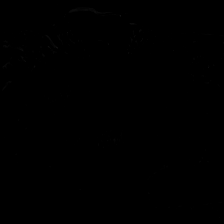
[im 40/597]
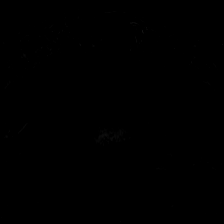
[im 80/597]
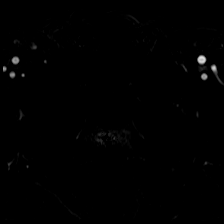
[im 120/597]
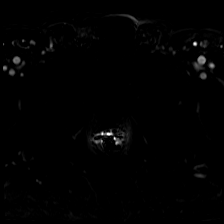
[im 159/597]
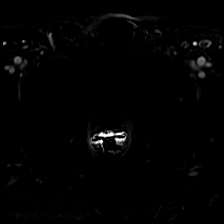
[im 199/597]
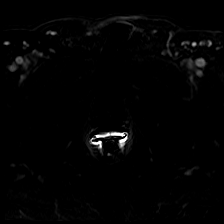
[im 239/597]
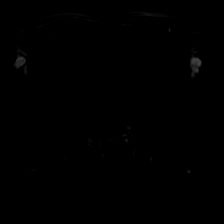
[im 279/597]
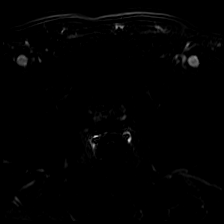
[im 318/597]
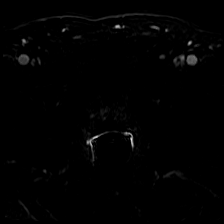
[im 358/597]
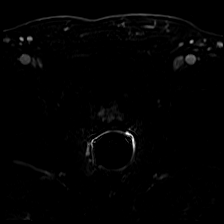
[im 398/597]
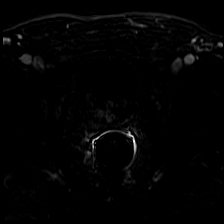
[im 438/597]
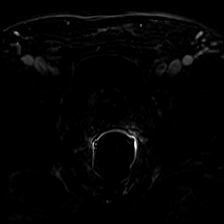
[im 477/597]
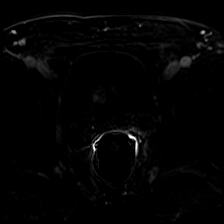
[im 517/597]
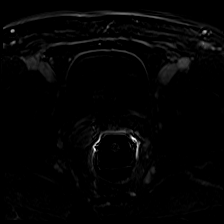
[im 557/597]
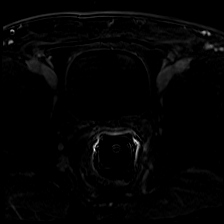
[im 597/597]
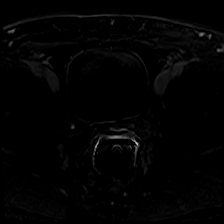

[Series 16: iliac crest thru · axial · 2.5mm · 1.19mm/px · z∈[-126,+152]mm · 3 of 112 slices shown]
[im 1/112]
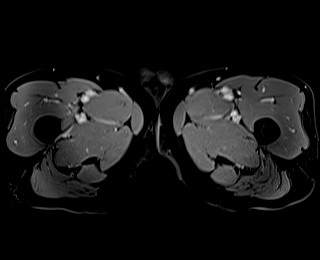
[im 56/112]
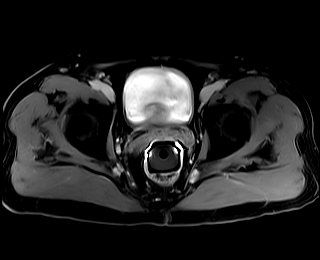
[im 112/112]
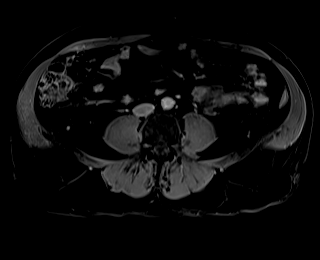

[48 of 48 positions shown; findings below may reference images not displayed]

FINDINGS: Prostate:

Region of interest # 1: PI-RADS category 3 lesion of the left
posterior transition zone in the mid gland, with a fairly sharply
defined low T2 signal nodule demonstrating low ADC map activity and
substantial restriction of diffusion (image 18 series 10 and image
18 series 11, respectively). This measures 0.14 cc (0.7 by 0.4 by
0.5 cm).

Region of interest # 2: PI-RADS category 3 lesion of the left
posterolateral peripheral zone at the apex, with focally reduced T2
signal and focal low ADC map activity (image 67, series 8 and image
22, series 10) but with no substantial restricted diffusion or early
enhancement. This measures 0.26 cc (0.8 by 0.4 by 1.0 cm).

Encapsulated nodularity in the transition zone compatible with
benign prostatic hypertrophy. The median lobe of the prostate gland
indents the bladder base.

Volume: 3D volumetric analysis: Prostate volume 82.82 cc (5.2 by
by 7.0 cm).

Transcapsular spread:  Absent

Seminal vesicle involvement: Absent

Neurovascular bundle involvement: Absent

Pelvic adenopathy: Absent

Bone metastasis: Absent

Other findings: No supplemental non-categorized findings.
IMPRESSION: 1. Small PI-RADS category 3 lesion of the left transition zone and
small PI-RADS category 3 lesion of the left posterolateral
peripheral zone. Targeting data sent to [facility].
2. Prostatomegaly and benign prostatic hypertrophy.

## 2022-06-20 ENCOUNTER — Other Ambulatory Visit: Payer: Self-pay | Admitting: Urology

## 2022-06-20 DIAGNOSIS — N5201 Erectile dysfunction due to arterial insufficiency: Secondary | ICD-10-CM

## 2022-06-28 ENCOUNTER — Other Ambulatory Visit: Payer: Self-pay | Admitting: Nurse Practitioner

## 2022-06-28 DIAGNOSIS — E781 Pure hyperglyceridemia: Secondary | ICD-10-CM

## 2022-07-05 ENCOUNTER — Other Ambulatory Visit: Payer: Self-pay | Admitting: Nurse Practitioner

## 2022-07-05 DIAGNOSIS — I825Y2 Chronic embolism and thrombosis of unspecified deep veins of left proximal lower extremity: Secondary | ICD-10-CM

## 2022-07-08 DIAGNOSIS — M545 Low back pain, unspecified: Secondary | ICD-10-CM | POA: Diagnosis not present

## 2022-08-03 ENCOUNTER — Other Ambulatory Visit: Payer: Medicare PPO

## 2022-08-03 DIAGNOSIS — R972 Elevated prostate specific antigen [PSA]: Secondary | ICD-10-CM | POA: Diagnosis not present

## 2022-08-03 DIAGNOSIS — E291 Testicular hypofunction: Secondary | ICD-10-CM | POA: Diagnosis not present

## 2022-08-07 ENCOUNTER — Other Ambulatory Visit: Payer: Self-pay

## 2022-08-07 NOTE — Patient Outreach (Signed)
[facility]) Care Management  08/07/2022  Harold Schwartz 1956-12-14 [id number]   Telephone Screen    Outreach call to patient to introduce [facility] services and assess care needs as part of benefit of PCP office and insurance plan. No answer. RN CM left HIPAA compliant voicemail message along with contact info.     Plan: RN CM will make outreach attempt to patient within 4 business days.  Enzo Montgomery, RN,BSN,CCM [place] Management Telephonic Care Management Coordinator Direct Phone: [phone] Toll Free: [phone] Fax: [phone]

## 2022-08-09 ENCOUNTER — Encounter: Payer: Self-pay | Admitting: Urology

## 2022-08-09 ENCOUNTER — Other Ambulatory Visit: Payer: Self-pay | Admitting: Urology

## 2022-08-09 NOTE — Telephone Encounter (Signed)
Please see below and advised. April is following up on lab results.

## 2022-08-10 ENCOUNTER — Other Ambulatory Visit: Payer: Self-pay

## 2022-08-10 ENCOUNTER — Ambulatory Visit: Payer: Medicare PPO | Admitting: Urology

## 2022-08-10 NOTE — Patient Outreach (Signed)
[facility]) Care Management  08/10/2022  Harold Schwartz [birth year] [id number]   Telephone Screen       Outreach call to patient to introduce [facility] services and assess care needs as part of benefit of PCP office and insurance plan.Spoke with patient. Denies any RN CM needs or concerns at this time.     Main healthcare issue/concern today: None per patient. States things going well. No issues with meds. Patient able to drive himself to medical appts. He remains independent with ADLS/IADLs.   Health Maintenance/Care Gaps: -Last AWV:04/27/22-per EMR & KPN -Vaccine-Patient due for PNA and influenza and already plans to get both this month.      Enzo Montgomery, RN,BSN,CCM [place] Management Telephonic Care Management Coordinator Direct Phone: [phone] Toll Free: [phone] Fax: [phone]

## 2022-08-11 LAB — CBC WITH DIFFERENTIAL
Basophils Absolute: 0 10*3/uL (ref 0.0–0.2)
Basos: 1 %
EOS (ABSOLUTE): 0.2 10*3/uL (ref 0.0–0.4)
Eos: 6 %
Hematocrit: 45.7 % (ref 37.5–51.0)
Hemoglobin: 15.6 g/dL (ref 13.0–17.7)
Immature Grans (Abs): 0 10*3/uL (ref 0.0–0.1)
Immature Granulocytes: 0 %
Lymphocytes Absolute: 1.8 10*3/uL (ref 0.7–3.1)
Lymphs: 43 %
MCH: 30.8 pg (ref 26.6–33.0)
MCHC: 34.1 g/dL (ref 31.5–35.7)
MCV: 90 fL (ref 79–97)
Monocytes Absolute: 0.3 10*3/uL (ref 0.1–0.9)
Monocytes: 7 %
Neutrophils Absolute: 1.8 10*3/uL (ref 1.4–7.0)
Neutrophils: 43 %
RBC: 5.07 x10E6/uL (ref 4.14–5.80)
RDW: 13.8 % (ref 11.6–15.4)
WBC: 4.2 10*3/uL (ref 3.4–10.8)

## 2022-08-11 LAB — COMPREHENSIVE METABOLIC PANEL
ALT: 15 IU/L (ref 0–44)
AST: 19 IU/L (ref 0–40)
Albumin/Globulin Ratio: 1.8 (ref 1.2–2.2)
Albumin: 4.4 g/dL (ref 3.9–4.9)
Alkaline Phosphatase: 39 IU/L — ABNORMAL LOW (ref 44–121)
BUN/Creatinine Ratio: 19 (ref 10–24)
BUN: 18 mg/dL (ref 8–27)
Bilirubin Total: 0.7 mg/dL (ref 0.0–1.2)
CO2: 25 mmol/L (ref 20–29)
Calcium: 9.3 mg/dL (ref 8.6–10.2)
Chloride: 102 mmol/L (ref 96–106)
Creatinine, Ser: 0.97 mg/dL (ref 0.76–1.27)
Globulin, Total: 2.5 g/dL (ref 1.5–4.5)
Glucose: 111 mg/dL — ABNORMAL HIGH (ref 70–99)
Potassium: 4.6 mmol/L (ref 3.5–5.2)
Sodium: 140 mmol/L (ref 134–144)
Total Protein: 6.9 g/dL (ref 6.0–8.5)
eGFR: 86 mL/min/{1.73_m2} (ref >59)

## 2022-08-11 LAB — PSA: Prostate Specific Ag, Serum: 3.8 ng/mL (ref 0.0–4.0)

## 2022-08-11 LAB — TESTOSTERONE,FREE AND TOTAL
Testosterone, Free: 35.4 pg/mL — ABNORMAL HIGH (ref 6.6–18.1)
Testosterone: 381 ng/dL (ref 264–916)

## 2022-08-17 ENCOUNTER — Ambulatory Visit: Payer: Medicare PPO | Admitting: Urology

## 2022-08-17 VITALS — BP 133/84 | HR 71

## 2022-08-17 DIAGNOSIS — N138 Other obstructive and reflux uropathy: Secondary | ICD-10-CM

## 2022-08-17 DIAGNOSIS — N401 Enlarged prostate with lower urinary tract symptoms: Secondary | ICD-10-CM

## 2022-08-17 DIAGNOSIS — N5201 Erectile dysfunction due to arterial insufficiency: Secondary | ICD-10-CM | POA: Diagnosis not present

## 2022-08-17 DIAGNOSIS — E291 Testicular hypofunction: Secondary | ICD-10-CM | POA: Diagnosis not present

## 2022-08-17 DIAGNOSIS — R351 Nocturia: Secondary | ICD-10-CM | POA: Diagnosis not present

## 2022-08-17 DIAGNOSIS — R972 Elevated prostate specific antigen [PSA]: Secondary | ICD-10-CM

## 2022-08-17 LAB — URINALYSIS, ROUTINE W REFLEX MICROSCOPIC
Bilirubin, UA: NEGATIVE
Glucose, UA: NEGATIVE
Leukocytes,UA: NEGATIVE
Nitrite, UA: NEGATIVE
Protein,UA: NEGATIVE
RBC, UA: NEGATIVE
Specific Gravity, UA: 1.02 (ref 1.005–1.030)
Urobilinogen, Ur: 0.2 mg/dL (ref 0.2–1.0)
pH, UA: 7 (ref 5.0–7.5)

## 2022-08-17 MED ORDER — TADALAFIL 20 MG PO TABS: ORAL_TABLET | ORAL | 3 refills | Status: DC

## 2022-08-17 MED ORDER — FINASTERIDE 5 MG PO TABS: 5.0000 mg | ORAL_TABLET | Freq: Every day | ORAL | 3 refills | Status: DC

## 2022-08-17 MED ORDER — JATENZO 158 MG PO CAPS
1.0000 | ORAL_CAPSULE | Freq: Two times a day (BID) | ORAL | 5 refills | Status: DC
Start: 2022-08-17 — End: 2023-02-01

## 2022-08-17 MED ORDER — SILODOSIN 8 MG PO CAPS
8.0000 mg | ORAL_CAPSULE | Freq: Every day | ORAL | 3 refills | Status: DC
Start: 2022-08-17 — End: 2023-02-01

## 2022-08-17 NOTE — Progress Notes (Unsigned)
08/17/2022 9:10 AM   Harold Schwartz January 22, 1956 [id number]  Referring provider: Michela Pitcher, NP [place],  [place] [address]  No chief complaint on file.   HPI: IPSS 3 QOl 0. He is on jatenzo '158mg'$  BID. Testosterone 381. HGb 15.8, CMp normal. Good energy   PMH: Past Medical History:  Diagnosis Date   Atrial flutter ([place])    Colon polyps    Depression    DVT (deep vein thrombosis) in pregnancy    last one in March 2019 taking eliquis   Genital herpes    Heart murmur    Hemorrhoids    Hyperlipidemia    Hypogonadism in male    Neuromuscular disorder (HCC)    slight numbnes in right foot    Surgical History: Past Surgical History:  Procedure Laterality Date   A-FLUTTER ABLATION N/A 03/22/2017   Procedure: A-Flutter Ablation;  Surgeon: Deboraha Sprang, MD;  Location: [facility];  Service: Cardiovascular;  Laterality: N/A;   COLONOSCOPY WITH PROPOFOL N/A 05/16/2018   Procedure: COLONOSCOPY WITH PROPOFOL;  Surgeon: Lucilla Lame, MD;  Location: [facility];  Service: Endoscopy;  Laterality: N/A;   KNEE ARTHROSCOPY     POLYPECTOMY  05/16/2018   Procedure: POLYPECTOMY;  Surgeon: Lucilla Lame, MD;  Location: [facility];  Service: Endoscopy;;   TENOTOMY ACHILLES TENDON Right 08/22/2018   VASECTOMY  1990    Home Medications:  Allergies as of 08/17/2022   No Known Allergies      Medication List        Accurate as of August 17, 2022  9:10 AM. If you have any questions, ask your nurse or doctor.          Celery Seed Oil Take 1 capsule by mouth in the morning and at bedtime.   Eliquis 5 MG Tabs tablet Generic drug: apixaban TAKE 1 TABLET BY MOUTH TWICE A DAY   ezetimibe 10 MG tablet Commonly known as: ZETIA TAKE 1 TABLET BY MOUTH EVERY DAY   finasteride 5 MG tablet Commonly known as: PROSCAR Take 1 tablet (5 mg total) by mouth daily.   Fish Oil 1200 MG Caps Take 2,400-3,600 capsules by mouth daily. Take 2,400 mg by  mouth in the morning and 3,600 mg in the evening   glucosamine-chondroitin 500-400 MG tablet Take 1 tablet by mouth 2 (two) times daily.   Jatenzo 158 MG Caps Generic drug: Testosterone Undecanoate Take 1 capsule by mouth in the morning and at bedtime.   Magnesium 300 MG Caps Take 300 mg by mouth at bedtime.   silodosin 8 MG Caps capsule Commonly known as: RAPAFLO Take 1 capsule (8 mg total) by mouth daily with breakfast.   tadalafil 20 MG tablet Commonly known as: CIALIS TAKE ONE TABLET BY MOUTH DAILY AS NEEDED FOR ERECTILE DYSFUNCTION   valACYclovir 1000 MG tablet Commonly known as: VALTREX Take 500 mg by mouth daily.   vitamin D3 50 MCG (2000 UT) Caps Take 6,000 Units by mouth.   vitamin E 180 MG (400 UNITS) capsule Take 400 Units by mouth daily.        Allergies: No Known Allergies  Family History: Family History  Problem Relation Age of Onset   Breast cancer Mother    Heart disease Mother    Hyperlipidemia Mother    Hypertension Mother    Colon cancer Father    Heart disease Father    Alzheimer's disease Father    Hyperlipidemia Father  Multiple sclerosis Sister        primary progessive MS   Hyperlipidemia Brother    Cancer Brother 41       Pancreatic 2020    Social History:  reports that he has never smoked. He has never used smokeless tobacco. He reports that he does not currently use alcohol after a past usage of about 7.0 standard drinks of alcohol per week. He reports that he does not use drugs.  ROS: All other review of systems were reviewed and are negative except what is noted above in HPI  Physical Exam: BP 133/84   Pulse 71   Constitutional:  Alert and oriented, No acute distress. HEENT: [place] AT, moist mucus membranes.  Trachea midline, no masses. Cardiovascular: No clubbing, cyanosis, or edema. Respiratory: Normal respiratory effort, no increased work of breathing. GI: Abdomen is soft, nontender, nondistended, no abdominal masses GU:  No CVA tenderness.  Lymph: No cervical or inguinal lymphadenopathy. Skin: No rashes, bruises or suspicious lesions. Neurologic: Grossly intact, no focal deficits, moving all 4 extremities. Psychiatric: Normal mood and affect.  Laboratory Data: Lab Results  Component Value Date   WBC 4.2 08/03/2022   HGB 15.6 08/03/2022   HCT 45.7 08/03/2022   MCV 90 08/03/2022   PLT 133.0 (L) 03/22/2022    Lab Results  Component Value Date   CREATININE 0.97 08/03/2022    Lab Results  Component Value Date   PSA 3.61 10/21/2017   PSA 3.47 06/29/2016   PSA 2.34 04/18/2015    Lab Results  Component Value Date   TESTOSTERONE 381 08/03/2022    Lab Results  Component Value Date   HGBA1C 5.5 09/15/2019    Urinalysis    Component Value Date/Time   APPEARANCEUR Clear 02/02/2022 1137   GLUCOSEU Negative 02/02/2022 1137   BILIRUBINUR negative 05/29/2022 1346   BILIRUBINUR Negative 02/02/2022 1137   KETONESUR negative 05/29/2022 1346   PROTEINUR negative 05/29/2022 1346   PROTEINUR Negative 02/02/2022 1137   UROBILINOGEN 0.2 05/29/2022 1346   NITRITE Negative 05/29/2022 1346   NITRITE Negative 02/02/2022 1137   LEUKOCYTESUR Negative 05/29/2022 1346   LEUKOCYTESUR Negative 02/02/2022 1137    Lab Results  Component Value Date   LABMICR Comment 02/02/2022    Pertinent Imaging: *** No results found for this or any previous visit.  No results found for this or any previous visit.  No results found for this or any previous visit.  No results found for this or any previous visit.  No results found for this or any previous visit.  No results found for this or any previous visit.  No results found for this or any previous visit.  No results found for this or any previous visit.   Assessment & Plan:    1. Elevated PSA *** - Urinalysis, Routine w reflex microscopic  2. Male hypogonadism ***  3. Nocturia ***  4. Erectile dysfunction due to arterial  insufficiency ***  5. Benign prostatic hyperplasia with urinary obstruction ***   No follow-ups on file.  Nicolette Bang, MD  [facility]

## 2022-08-21 ENCOUNTER — Encounter: Payer: Self-pay | Admitting: Urology

## 2022-08-21 NOTE — Patient Instructions (Signed)

## 2022-08-22 ENCOUNTER — Encounter: Payer: Self-pay | Admitting: Nurse Practitioner

## 2022-08-23 ENCOUNTER — Telehealth: Payer: Self-pay

## 2022-08-23 NOTE — Telephone Encounter (Signed)
Patient will be going to [place] on 09/12 his next jatenzo refill isnt until 09/16.  He cannot afford the oop cost to fill early.  Do you have any other options for the patient?  If not he will just skip this month but he had switched to this rx becuas of his travel.  Please advise.

## 2022-08-30 NOTE — Telephone Encounter (Signed)
Verbal from Dr. Alyson Ingles that he did not have any other recommendations, when I last spoke to patient he states that he would just skip his rx until he came back.

## 2022-10-01 DIAGNOSIS — D3122 Benign neoplasm of left retina: Secondary | ICD-10-CM | POA: Diagnosis not present

## 2022-10-01 DIAGNOSIS — D3121 Benign neoplasm of right retina: Secondary | ICD-10-CM | POA: Diagnosis not present

## 2022-11-14 ENCOUNTER — Other Ambulatory Visit: Payer: Self-pay | Admitting: Internal Medicine

## 2022-11-14 NOTE — Telephone Encounter (Signed)
Requested Prescriptions  Refused Prescriptions Disp Refills   valACYclovir (VALTREX) 1000 MG tablet [Pharmacy Med Name: valACYclovir HCL 1 GRAM TABLET] 90 tablet     Sig: TAKE ONE TABLET BY MOUTH DAILY     There is no refill protocol information for this order

## 2022-12-11 ENCOUNTER — Encounter: Payer: Self-pay | Admitting: Nurse Practitioner

## 2022-12-11 ENCOUNTER — Other Ambulatory Visit: Payer: Self-pay

## 2022-12-21 ENCOUNTER — Encounter: Payer: Self-pay | Admitting: Nurse Practitioner

## 2022-12-21 ENCOUNTER — Ambulatory Visit: Payer: Medicare PPO | Admitting: Nurse Practitioner

## 2022-12-21 VITALS — BP 120/78 | HR 60 | Ht 71.0 in | Wt 212.0 lb

## 2022-12-21 DIAGNOSIS — A6000 Herpesviral infection of urogenital system, unspecified: Secondary | ICD-10-CM

## 2022-12-21 DIAGNOSIS — L989 Disorder of the skin and subcutaneous tissue, unspecified: Secondary | ICD-10-CM | POA: Diagnosis not present

## 2022-12-21 DIAGNOSIS — L821 Other seborrheic keratosis: Secondary | ICD-10-CM

## 2022-12-21 MED ORDER — VALACYCLOVIR HCL 1 G PO TABS: 500.0000 mg | ORAL_TABLET | Freq: Every day | ORAL | 1 refills | Status: DC

## 2022-12-21 NOTE — Progress Notes (Signed)
   Acute Office Visit  Subjective:     Patient ID: Harold Schwartz, male    DOB: [birth year], 67 y.o.   MRN: [id number]  Chief Complaint  Patient presents with   Skin Problem    HPI Patient is in today for Skin problem  States that he has a couple spots on his head and one on his back. 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 States that the one on the head has been a year and frequently bleeds  Out in the sun a lot younger and skin protectoin was used.    Suppression therapy for Gential herpes with out an outbreak states using it.  He does take valacyclovir 500 mg daily.  Tolerates medication well would like a refill today   Review of Systems  Constitutional:  Negative for chills and fever.  Skin:  Positive for itching.       "+" skin lesions         Objective:    BP 120/78   Pulse 60   Ht '5\' 11"'$  (1.803 m)   Wt 212 lb (96.2 kg)   SpO2 97%   BMI 29.57 kg/m    Physical Exam Skin:    Findings: Lesion present. No erythema or rash.          Comments: Lesion to top anterolateral head. Papular with flaky skin and increased pigmentation      No results found for any visits on 12/21/22.      Assessment & Plan:   Problem List Items Addressed This Visit       Musculoskeletal and Integument   Skin lesion    Ambulatory referral to Derm at allergy for further investigation for lesions of the left anterior lateral surface.      Relevant Orders   Ambulatory referral to Dermatology   Seborrheic keratosis    Benign lesion to left shoulder blade area.  Did print off information at discharge in regards to SK.      Relevant Orders   Ambulatory referral to Dermatology     Genitourinary   Genital herpes - Primary    Patient currently maintained on valacyclovir 500 mg daily.  Tolerates medication well.  Is keeping herpes with suppression.  Refill medication today      Relevant Medications   valACYclovir (VALTREX) 1000 MG tablet    Meds ordered this  encounter  Medications   valACYclovir (VALTREX) 1000 MG tablet    Sig: Take 0.5 tablets (500 mg total) by mouth daily.    Dispense:  45 tablet    Refill:  1    Return in about 4 months (around 04/21/2023) for CPE and labs.  Romilda Garret, NP

## 2022-12-21 NOTE — Assessment & Plan Note (Signed)
Benign lesion to left shoulder blade area.  Did print off information at discharge in regards to SK.

## 2022-12-21 NOTE — Assessment & Plan Note (Signed)
Ambulatory referral to Derm at allergy for further investigation for lesions of the left anterior lateral surface.

## 2022-12-21 NOTE — Patient Instructions (Signed)
Nice to see you today I want to see you in 4 months for your physical, sooner if you need me I did refer you to dermatology

## 2022-12-21 NOTE — Assessment & Plan Note (Signed)
Patient currently maintained on valacyclovir 500 mg daily.  Tolerates medication well.  Is keeping herpes with suppression.  Refill medication today

## 2023-01-01 ENCOUNTER — Other Ambulatory Visit: Payer: Self-pay | Admitting: Nurse Practitioner

## 2023-01-01 DIAGNOSIS — E781 Pure hyperglyceridemia: Secondary | ICD-10-CM

## 2023-01-17 ENCOUNTER — Telehealth: Payer: Self-pay

## 2023-01-17 ENCOUNTER — Other Ambulatory Visit: Payer: Self-pay

## 2023-01-17 DIAGNOSIS — Z86010 Personal history of colonic polyps: Secondary | ICD-10-CM

## 2023-01-17 MED ORDER — NA SULFATE-K SULFATE-MG SULF 17.5-3.13-1.6 GM/177ML PO SOLN: 354.0000 mL | Freq: Once | ORAL | 0 refills | Status: AC

## 2023-01-17 NOTE — Telephone Encounter (Signed)
Patient is calling to schedule repeat colonoscopy. He states he would like to do this at [facility]. Schedule with Dr. Allen Norris on 05/28/2023. Went over instructions, Mailed them and sent to [facility]. Sent prep to the pharmacy. Requested blood thinner clearance form PCP

## 2023-01-19 ENCOUNTER — Other Ambulatory Visit: Payer: Self-pay | Admitting: Nurse Practitioner

## 2023-01-19 DIAGNOSIS — I825Y2 Chronic embolism and thrombosis of unspecified deep veins of left proximal lower extremity: Secondary | ICD-10-CM

## 2023-01-23 ENCOUNTER — Other Ambulatory Visit: Payer: Medicare PPO

## 2023-01-24 ENCOUNTER — Other Ambulatory Visit: Payer: Medicare PPO

## 2023-01-24 DIAGNOSIS — E291 Testicular hypofunction: Secondary | ICD-10-CM | POA: Diagnosis not present

## 2023-01-24 DIAGNOSIS — N5201 Erectile dysfunction due to arterial insufficiency: Secondary | ICD-10-CM | POA: Diagnosis not present

## 2023-02-01 ENCOUNTER — Telehealth: Payer: Self-pay | Admitting: Nurse Practitioner

## 2023-02-01 ENCOUNTER — Ambulatory Visit: Payer: Medicare PPO | Admitting: Urology

## 2023-02-01 ENCOUNTER — Encounter: Payer: Self-pay | Admitting: Urology

## 2023-02-01 VITALS — BP 139/85 | HR 64

## 2023-02-01 DIAGNOSIS — R351 Nocturia: Secondary | ICD-10-CM

## 2023-02-01 DIAGNOSIS — N138 Other obstructive and reflux uropathy: Secondary | ICD-10-CM

## 2023-02-01 DIAGNOSIS — N401 Enlarged prostate with lower urinary tract symptoms: Secondary | ICD-10-CM | POA: Diagnosis not present

## 2023-02-01 DIAGNOSIS — R972 Elevated prostate specific antigen [PSA]: Secondary | ICD-10-CM | POA: Diagnosis not present

## 2023-02-01 DIAGNOSIS — N5201 Erectile dysfunction due to arterial insufficiency: Secondary | ICD-10-CM

## 2023-02-01 DIAGNOSIS — E291 Testicular hypofunction: Secondary | ICD-10-CM | POA: Diagnosis not present

## 2023-02-01 LAB — URINALYSIS, ROUTINE W REFLEX MICROSCOPIC
Bilirubin, UA: NEGATIVE
Glucose, UA: NEGATIVE
Ketones, UA: NEGATIVE
Leukocytes,UA: NEGATIVE
Nitrite, UA: NEGATIVE
Protein,UA: NEGATIVE
RBC, UA: NEGATIVE
Specific Gravity, UA: 1.02 (ref 1.005–1.030)
Urobilinogen, Ur: 0.2 mg/dL (ref 0.2–1.0)
pH, UA: 6 (ref 5.0–7.5)

## 2023-02-01 MED ORDER — TADALAFIL 20 MG PO TABS: ORAL_TABLET | ORAL | 3 refills | Status: DC

## 2023-02-01 MED ORDER — FINASTERIDE 5 MG PO TABS: 5.0000 mg | ORAL_TABLET | Freq: Every day | ORAL | 3 refills | Status: DC

## 2023-02-01 MED ORDER — JATENZO 158 MG PO CAPS: 1.0000 | ORAL_CAPSULE | Freq: Two times a day (BID) | ORAL | 5 refills | Status: DC

## 2023-02-01 MED ORDER — SILODOSIN 8 MG PO CAPS: 8.0000 mg | ORAL_CAPSULE | Freq: Every day | ORAL | 3 refills | Status: DC

## 2023-02-01 NOTE — Telephone Encounter (Signed)
Spoke to Patient he states he did stop the Elliquis for the previous colonoscopy and did fine. He just could not remember the time frame so I told him 2 days. Patient understands and is agreeable.

## 2023-02-01 NOTE — Patient Instructions (Signed)

## 2023-02-01 NOTE — Progress Notes (Signed)
02/01/2023 10:17 AM   [name] 1956/03/09 [id number]  Referring provider: Michela Pitcher, NP [place],  [place] [address]  Followup hypogonadism, BPH and erectile dysfunction   HPI: Mr Blazier is a 67yo here for followup for hypogonadism, BPH and erectile dysfunction. He on Jatenzo 175m BID. Testosterone 116, hgb 15.2, PSA 3.1 on finasteride. IPSS 7 QOL 3 on rapaflo 855m Nocturia 1x. He takes tadalafil 2044mrn which works well   PMH: Past Medical History:  Diagnosis Date   [facility] [facility]  Colon polyps    Depression    DVT (deep vein thrombosis) in pregnancy    last one in March 2019 taking eliquis   Genital herpes    Heart murmur    Hemorrhoids    Hyperlipidemia    Hypogonadism in male    Neuromuscular disorder (HCC)    slight numbnes in right foot    Surgical History: Past Surgical History:  Procedure Laterality Date   A-FLUTTER ABLATION N/A 03/22/2017   Procedure: A-Flutter Ablation;  Surgeon: SteDeboraha SprangD;  Location: [facility];  Service: Cardiovascular;  Laterality: N/A;   COLONOSCOPY WITH PROPOFOL N/A 05/16/2018   Procedure: COLONOSCOPY WITH PROPOFOL;  Surgeon: WohLucilla LameD;  [facility];  Laterality: N/A;   KNEE ARTHROSCOPY     POLYPECTOMY  05/16/2018   Procedure: POLYPECTOMY;  Surgeon: WohLucilla LameD;  [facility];;   TENOTOMY ACHILLES TENDON Right 08/22/2018   VASECTOMY  1990    Home Medications:  Allergies as of 02/01/2023   No Known Allergies      Medication List        Accurate as of February 01, 2023 10:17 AM. If you have any questions, ask your nurse or doctor.          Celery Seed Oil Take 1 capsule by mouth in the morning and at bedtime.   Eliquis 5 MG Tabs tablet Generic drug: apixaban TAKE 1 TABLET BY MOUTH TWICE A DAY   ezetimibe 10 MG tablet Commonly known as: ZETIA TAKE 1 TABLET BY MOUTH EVERY DAY    finasteride 5 MG tablet Commonly known as: PROSCAR Take 1 tablet (5 mg total) by mouth daily.   Fish Oil 1200 MG Caps Take 2,400-3,600 capsules by mouth daily. Take 2,400 mg by mouth in the morning and 3,600 mg in the evening   glucosamine-chondroitin 500-400 MG tablet Take 1 tablet by mouth 2 (two) times daily.   Jatenzo 158 MG Caps Generic drug: Testosterone Undecanoate Take 1 capsule by mouth in the morning and at bedtime.   Magnesium 300 MG Caps Take 300 mg by mouth at bedtime.   silodosin 8 MG Caps capsule Commonly known as: RAPAFLO Take 1 capsule (8 mg total) by mouth daily with breakfast.   tadalafil 20 MG tablet Commonly known as: CIALIS TAKE ONE TABLET BY MOUTH DAILY AS NEEDED FOR ERECTILE DYSFUNCTION   valACYclovir 1000 MG tablet Commonly known as: VALTREX Take 0.5 tablets (500 mg total) by mouth daily.   vitamin D3 50 MCG (2000 UT) Caps Take 6,000 Units by mouth.   vitamin E 180 MG (400 UNITS) capsule Take 400 Units by mouth daily.        Allergies: No Known Allergies  Family History: Family History  Problem Relation Age of Onset   Breast cancer Mother    Heart disease Mother    Hyperlipidemia Mother  Hypertension Mother    Colon cancer Father    Heart disease Father    Alzheimer's disease Father    Hyperlipidemia Father    Multiple sclerosis Sister        primary progessive MS   Hyperlipidemia Brother    Cancer Brother 46       Pancreatic 2020    Social History:  reports that he has never smoked. He has never used smokeless tobacco. He reports that he does not currently use alcohol after a past usage of about 7.0 standard drinks of alcohol per week. He reports that he does not use drugs.  ROS: All other review of systems were reviewed and are negative except what is noted above in HPI  Physical Exam: BP 139/85   Pulse 64   Constitutional:  Alert and oriented, No acute distress. HEENT: [place] AT, moist mucus membranes.  Trachea midline,  no masses. Cardiovascular: No clubbing, cyanosis, or edema. Respiratory: Normal respiratory effort, no increased work of breathing. GI: Abdomen is soft, nontender, nondistended, no abdominal masses GU: No CVA tenderness.  Lymph: No cervical or inguinal lymphadenopathy. Skin: No rashes, bruises or suspicious lesions. Neurologic: Grossly intact, no focal deficits, moving all 4 extremities. Psychiatric: Normal mood and affect.  Laboratory Data: Lab Results  Component Value Date   WBC 4.2 08/03/2022   HGB 15.6 08/03/2022   HCT 45.7 08/03/2022   MCV 90 08/03/2022   PLT 133.0 (L) 03/22/2022    Lab Results  Component Value Date   CREATININE 0.97 08/03/2022    Lab Results  Component Value Date   PSA 3.61 10/21/2017   PSA 3.47 06/29/2016   PSA 2.34 04/18/2015    Lab Results  Component Value Date   TESTOSTERONE 381 08/03/2022    Lab Results  Component Value Date   HGBA1C 5.5 09/15/2019    Urinalysis    Component Value Date/Time   APPEARANCEUR Clear 08/17/2022 1130   GLUCOSEU Negative 08/17/2022 1130   BILIRUBINUR Negative 08/17/2022 1130   KETONESUR negative 05/29/2022 1346   PROTEINUR Negative 08/17/2022 1130   UROBILINOGEN 0.2 05/29/2022 1346   NITRITE Negative 08/17/2022 1130   LEUKOCYTESUR Negative 08/17/2022 1130    Lab Results  Component Value Date   LABMICR Comment 08/17/2022    Pertinent Imaging:  No results found for this or any previous visit.  No results found for this or any previous visit.  No results found for this or any previous visit.  No results found for this or any previous visit.  No results found for this or any previous visit.  No valid procedures specified. No results found for this or any previous visit.  No results found for this or any previous visit.   Assessment & Plan:    1. Elevated PSA Followup 6 months with PSA - Urinalysis, Routine w reflex microscopic  2. Male hypogonadism -Continue Jatenzo 183m  BID -followup 3 months with testosterone labs  3. Nocturia -continue rapaflo 853m 4. Benign prostatic hyperplasia with urinary obstruction Continue rapaflo 102m29mnd finasteride 5mg31m. Erectile dysfunction due to arterial insufficiency Continue tadalafil 20mg60m   No follow-ups on file.  PatriNicolette Bang [facility] [place]

## 2023-02-01 NOTE — Telephone Encounter (Signed)
Can we call the patient and let him know I got notice of him having a colonoscopy. This requires him to come off his blood thinner before the procedure. I see he has had a colonoscopy not long ago. Did he do ok coming off the blood thinner at that time? IF he has that is great. We will stop the eliquis 2 days prior to the colonoscopy. IF he has not do not give him any instructions and send me back a message and I will make a decision from then

## 2023-02-02 LAB — CBC WITH DIFFERENTIAL
Basophils Absolute: 0 10*3/uL (ref 0.0–0.2)
Basos: 1 %
EOS (ABSOLUTE): 0.2 10*3/uL (ref 0.0–0.4)
Eos: 3 %
Hematocrit: 45.6 % (ref 37.5–51.0)
Hemoglobin: 15.2 g/dL (ref 13.0–17.7)
Immature Grans (Abs): 0 10*3/uL (ref 0.0–0.1)
Immature Granulocytes: 0 %
Lymphocytes Absolute: 2 10*3/uL (ref 0.7–3.1)
Lymphs: 41 %
MCH: 29.6 pg (ref 26.6–33.0)
MCHC: 33.3 g/dL (ref 31.5–35.7)
MCV: 89 fL (ref 79–97)
Monocytes Absolute: 0.3 10*3/uL (ref 0.1–0.9)
Monocytes: 7 %
Neutrophils Absolute: 2.3 10*3/uL (ref 1.4–7.0)
Neutrophils: 48 %
RBC: 5.13 x10E6/uL (ref 4.14–5.80)
RDW: 13.6 % (ref 11.6–15.4)
WBC: 4.8 10*3/uL (ref 3.4–10.8)

## 2023-02-02 LAB — PSA: Prostate Specific Ag, Serum: 3.1 ng/mL (ref 0.0–4.0)

## 2023-02-02 LAB — COMPREHENSIVE METABOLIC PANEL
ALT: 18 IU/L (ref 0–44)
AST: 17 IU/L (ref 0–40)
Albumin/Globulin Ratio: 1.7 (ref 1.2–2.2)
Albumin: 4.3 g/dL (ref 3.9–4.9)
Alkaline Phosphatase: 44 IU/L (ref 44–121)
BUN/Creatinine Ratio: 16 (ref 10–24)
BUN: 17 mg/dL (ref 8–27)
Bilirubin Total: 0.8 mg/dL (ref 0.0–1.2)
CO2: 24 mmol/L (ref 20–29)
Calcium: 9 mg/dL (ref 8.6–10.2)
Chloride: 104 mmol/L (ref 96–106)
Creatinine, Ser: 1.05 mg/dL (ref 0.76–1.27)
Globulin, Total: 2.6 g/dL (ref 1.5–4.5)
Glucose: 91 mg/dL (ref 70–99)
Potassium: 4.6 mmol/L (ref 3.5–5.2)
Sodium: 140 mmol/L (ref 134–144)
Total Protein: 6.9 g/dL (ref 6.0–8.5)
eGFR: 77 mL/min/{1.73_m2} (ref >59)

## 2023-02-02 LAB — TESTOSTERONE,FREE AND TOTAL
Testosterone, Free: 6.5 pg/mL — ABNORMAL LOW (ref 6.6–18.1)
Testosterone: 116 ng/dL — ABNORMAL LOW (ref 264–916)

## 2023-02-06 ENCOUNTER — Telehealth: Payer: Self-pay

## 2023-02-06 NOTE — Telephone Encounter (Signed)
Per PCP patient needs to stop the Eliquis 2 days prior to procedure and restart it the medication as soon as possible. Called patient and patient verbalized understanding of instructions

## 2023-03-12 DIAGNOSIS — D2371 Other benign neoplasm of skin of right lower limb, including hip: Secondary | ICD-10-CM | POA: Diagnosis not present

## 2023-03-12 DIAGNOSIS — L578 Other skin changes due to chronic exposure to nonionizing radiation: Secondary | ICD-10-CM | POA: Diagnosis not present

## 2023-03-12 DIAGNOSIS — L538 Other specified erythematous conditions: Secondary | ICD-10-CM | POA: Diagnosis not present

## 2023-03-12 DIAGNOSIS — L570 Actinic keratosis: Secondary | ICD-10-CM | POA: Diagnosis not present

## 2023-03-12 DIAGNOSIS — L920 Granuloma annulare: Secondary | ICD-10-CM | POA: Diagnosis not present

## 2023-03-12 DIAGNOSIS — L821 Other seborrheic keratosis: Secondary | ICD-10-CM | POA: Diagnosis not present

## 2023-03-12 DIAGNOSIS — L298 Other pruritus: Secondary | ICD-10-CM | POA: Diagnosis not present

## 2023-03-12 DIAGNOSIS — L820 Inflamed seborrheic keratosis: Secondary | ICD-10-CM | POA: Diagnosis not present

## 2023-04-22 ENCOUNTER — Encounter: Payer: Self-pay | Admitting: Nurse Practitioner

## 2023-04-22 ENCOUNTER — Ambulatory Visit (INDEPENDENT_AMBULATORY_CARE_PROVIDER_SITE_OTHER): Payer: Medicare PPO | Admitting: Nurse Practitioner

## 2023-04-22 VITALS — BP 104/70 | HR 67 | Temp 99.1°F | Resp 16 | Ht 70.5 in | Wt 207.5 lb

## 2023-04-22 DIAGNOSIS — Z0000 Encounter for general adult medical examination without abnormal findings: Secondary | ICD-10-CM | POA: Diagnosis not present

## 2023-04-22 DIAGNOSIS — E781 Pure hyperglyceridemia: Secondary | ICD-10-CM | POA: Diagnosis not present

## 2023-04-22 DIAGNOSIS — I4892 Unspecified atrial flutter: Secondary | ICD-10-CM

## 2023-04-22 DIAGNOSIS — E291 Testicular hypofunction: Secondary | ICD-10-CM

## 2023-04-22 DIAGNOSIS — I825Y2 Chronic embolism and thrombosis of unspecified deep veins of left proximal lower extremity: Secondary | ICD-10-CM

## 2023-04-22 NOTE — Assessment & Plan Note (Signed)
Discussed age-appropriate immunizations and screening exams.  Did review patient's personal, surgical, social, family histories.  Patient up-to-date on all vaccinations.  Patient is due for CRC screening screening this year has an appointment scheduled in June.  PSA is followed by urology.  Patient was given information at discharge about preventative healthcare maintenance with anticipatory guidance.

## 2023-04-22 NOTE — Assessment & Plan Note (Signed)
Patient currently anticoagulated on Eliquis 5 mg twice daily.  Stable continue

## 2023-04-22 NOTE — Patient Instructions (Signed)
Nice to see you today I will be in touch with the lab results once it is resulted Follow up with me in 1 year, sooner if you need me

## 2023-04-22 NOTE — Progress Notes (Signed)
Established Patient Office Visit  Subjective   Patient ID: Harold Schwartz, male    DOB: [birth year]  Age: 67 y.o. MRN: [id number]  Chief Complaint  Patient presents with   Annual Exam    HPI  DVT: states that he tolerates it well. States that he bleeds easliy.  Tolerates medication well.  Patient does have paroxysmal atrial fibrillation along with chronic DVT Patient states when A-fib hits the watch will say his heart rates have been in the 160s but is short-lived and will go back to normal rhythm and rate within 10 to 15 minutes.  BPH: followed by Urology. State that he is generally every 6 months. States that urology manages the testosterone as well as his finasteride and tadalafil   for complete physical and follow up of chronic conditions.  Immunizations: -Tetanus: Completed in 2016 -Influenza: Completed this season -Shingles: Completed Shingrix series -Pneumonia: Completed   Diet: Fair diet. 3 meals a day. States that at home.  Water and coffee in the am  Exercise:  4 miles a day   Eye exam: Completes annually. This years. glasses  Dental exam: Completes semi-annually    Colonoscopy: Completed in 05/16/2018, repeat in 5 year. 04/2023. Scheduled for 05/28/2023 Lung Cancer Screening: N/A  PSA:  01/24/2023  Sleep: states that he gets good sleep. Bed around 10 and get up 730-8am. States that he feels rested. Sattes that he does not snore        Review of Systems  Constitutional:  Negative for chills and fever.  Respiratory:  Negative for shortness of breath.   Cardiovascular:  Positive for leg swelling (wears compression sock). Negative for chest pain.  Gastrointestinal:  Negative for abdominal pain, blood in stool, constipation, diarrhea, nausea and vomiting.       BM daily   Genitourinary:  Negative for dysuria and hematuria.  Neurological:  Negative for tingling and headaches.  Psychiatric/Behavioral:  Negative for hallucinations and suicidal ideas.        Objective:     BP 104/70   Pulse 67   Temp 99.1 F (37.3 C)   Resp 16   Ht 5' 10.5" (1.791 m)   Wt 207 lb 8 oz (94.1 kg)   SpO2 96%   BMI 29.35 kg/m    Physical Exam Vitals and nursing note reviewed.  Constitutional:      Appearance: Normal appearance.  HENT:     Right Ear: Tympanic membrane, ear canal and external ear normal.     Left Ear: Tympanic membrane, ear canal and external ear normal.     Mouth/Throat:     Mouth: Mucous membranes are moist.     Pharynx: Oropharynx is clear.  Eyes:     Extraocular Movements: Extraocular movements intact.     Pupils: Pupils are equal, round, and reactive to light.  Cardiovascular:     Rate and Rhythm: Normal rate and regular rhythm.     Pulses: Normal pulses.     Heart sounds: Normal heart sounds.  Pulmonary:     Effort: Pulmonary effort is normal.     Breath sounds: Normal breath sounds.  Abdominal:     General: Bowel sounds are normal. There is no distension.     Palpations: There is no mass.     Tenderness: There is no abdominal tenderness.     Hernia: No hernia is present.  Genitourinary:    Comments: Deferred  Musculoskeletal:     Right lower leg: No edema.  Left lower leg: No edema.  Lymphadenopathy:     Cervical: No cervical adenopathy.  Skin:    General: Skin is warm.  Neurological:     General: No focal deficit present.     Mental Status: He is alert.     Deep Tendon Reflexes:     Reflex Scores:      Bicep reflexes are 2+ on the right side and 2+ on the left side.      Patellar reflexes are 2+ on the right side and 2+ on the left side.    Comments: Bilateral upper and lower extremity strength 5/5  Psychiatric:        Mood and Affect: Mood normal.        Behavior: Behavior normal.        Thought Content: Thought content normal.        Judgment: Judgment normal.      No results found for any visits on 04/22/23.    The 10-year ASCVD risk score (Arnett DK, et al., 2019) is: 8.6%    Assessment  & Plan:   Problem List Items Addressed This Visit       Cardiovascular and Mediastinum   Paroxysmal atrial flutter (HCC)    History of the same not on any rate control drugs his anticoagulated. stable      Chronic deep vein thrombosis (DVT) of proximal vein of left lower extremity (HCC)    Patient currently anticoagulated on Eliquis 5 mg twice daily.  Stable continue        Endocrine   Male hypogonadism    Patient is followed by urology on oral testosterone replacement.  Continue following with urology as recommended        Other   Hypertriglyceridemia    History of the same will check lipids when he gets his blood drawn at [facility] for urologist.      Relevant Orders   Lipid panel   Preventative health care - Primary    Discussed age-appropriate immunizations and screening exams.  Did review patient's personal, surgical, social, family histories.  Patient up-to-date on all vaccinations.  Patient is due for CRC screening screening this year has an appointment scheduled in June.  PSA is followed by urology.  Patient was given information at discharge about preventative healthcare maintenance with anticipatory guidance.      Relevant Orders   Lipid panel    Return in about 1 year (around 04/21/2024) for CPE and Labs.    Audria Nine, NP

## 2023-04-22 NOTE — Assessment & Plan Note (Signed)
History of the same not on any rate control drugs his anticoagulated. stable

## 2023-04-22 NOTE — Assessment & Plan Note (Signed)
Patient is followed by urology on oral testosterone replacement.  Continue following with urology as recommended

## 2023-04-22 NOTE — Assessment & Plan Note (Signed)
History of the same will check lipids when he gets his blood drawn at [facility] for urologist.

## 2023-04-24 ENCOUNTER — Other Ambulatory Visit: Payer: Self-pay | Admitting: Urology

## 2023-04-24 DIAGNOSIS — Z0000 Encounter for general adult medical examination without abnormal findings: Secondary | ICD-10-CM | POA: Diagnosis not present

## 2023-04-24 DIAGNOSIS — E291 Testicular hypofunction: Secondary | ICD-10-CM | POA: Diagnosis not present

## 2023-04-24 DIAGNOSIS — E781 Pure hyperglyceridemia: Secondary | ICD-10-CM | POA: Diagnosis not present

## 2023-04-25 LAB — LIPID PANEL
Chol/HDL Ratio: 2.8 ratio (ref 0.0–5.0)
Cholesterol, Total: 174 mg/dL (ref 100–199)
HDL: 63 mg/dL (ref >39)
LDL Chol Calc (NIH): 102 mg/dL — ABNORMAL HIGH (ref 0–99)
Triglycerides: 41 mg/dL (ref 0–149)
VLDL Cholesterol Cal: 9 mg/dL (ref 5–40)

## 2023-04-29 LAB — COMPREHENSIVE METABOLIC PANEL
ALT: 21 IU/L (ref 0–44)
AST: 22 IU/L (ref 0–40)
Albumin/Globulin Ratio: 1.6 (ref 1.2–2.2)
Albumin: 4 g/dL (ref 3.9–4.9)
Alkaline Phosphatase: 49 IU/L (ref 44–121)
BUN/Creatinine Ratio: 15 (ref 10–24)
BUN: 16 mg/dL (ref 8–27)
Bilirubin Total: 0.7 mg/dL (ref 0.0–1.2)
CO2: 24 mmol/L (ref 20–29)
Calcium: 8.8 mg/dL (ref 8.6–10.2)
Chloride: 105 mmol/L (ref 96–106)
Creatinine, Ser: 1.1 mg/dL (ref 0.76–1.27)
Globulin, Total: 2.5 g/dL (ref 1.5–4.5)
Glucose: 85 mg/dL (ref 70–99)
Potassium: 4.7 mmol/L (ref 3.5–5.2)
Sodium: 143 mmol/L (ref 134–144)
Total Protein: 6.5 g/dL (ref 6.0–8.5)
eGFR: 73 mL/min/{1.73_m2} (ref >59)

## 2023-04-29 LAB — TESTOSTERONE,FREE AND TOTAL
Testosterone, Free: 19.3 pg/mL — ABNORMAL HIGH (ref 6.6–18.1)
Testosterone: 295 ng/dL (ref 264–916)

## 2023-05-01 ENCOUNTER — Ambulatory Visit: Payer: Medicare PPO | Admitting: Urology

## 2023-05-01 VITALS — BP 128/73 | HR 77

## 2023-05-01 DIAGNOSIS — N401 Enlarged prostate with lower urinary tract symptoms: Secondary | ICD-10-CM | POA: Diagnosis not present

## 2023-05-01 DIAGNOSIS — E291 Testicular hypofunction: Secondary | ICD-10-CM | POA: Diagnosis not present

## 2023-05-01 DIAGNOSIS — N138 Other obstructive and reflux uropathy: Secondary | ICD-10-CM

## 2023-05-01 DIAGNOSIS — R351 Nocturia: Secondary | ICD-10-CM | POA: Diagnosis not present

## 2023-05-01 DIAGNOSIS — N5201 Erectile dysfunction due to arterial insufficiency: Secondary | ICD-10-CM

## 2023-05-01 MED ORDER — JATENZO 198 MG PO CAPS: 1.0000 | ORAL_CAPSULE | Freq: Two times a day (BID) | ORAL | 5 refills | Status: DC

## 2023-05-01 MED ORDER — SILODOSIN 8 MG PO CAPS: 8.0000 mg | ORAL_CAPSULE | Freq: Every day | ORAL | 3 refills | Status: DC

## 2023-05-01 NOTE — Progress Notes (Signed)
05/01/2023 10:19 AM   Harold Schwartz [birth year] [id number]  Referring provider: Eden Emms, NP [address] [place],  [place] [address]  Chief Complaint  Patient presents with   testosterone labs    HPI: [name] is a 67yo here for followup for hypogonadism, nocturia and erectile dysfunction. Testosterone 295 on jatenzo 158mg  BID. Energy fair. Libido fair. IPSS 12 QOl 2 on rapaflo 8mg . Urine stream strong. No straining to urinate. Nocturia 1x. No issues with erectile dysfunction currently   PMH: Past Medical History:  Diagnosis Date   Atrial flutter (HCC)    Colon polyps    Depression    DVT (deep vein thrombosis) in pregnancy    last one in March 2019 taking eliquis   Genital herpes    Heart murmur    Hemorrhoids    Hyperlipidemia    Hypogonadism in male    Neuromuscular disorder (HCC)    slight numbnes in right foot    Surgical History: Past Surgical History:  Procedure Laterality Date   A-FLUTTER ABLATION N/A 03/22/2017   Procedure: A-Flutter Ablation;  Surgeon: Duke Salvia, MD;  [facility];  Service: Cardiovascular;  Laterality: N/A;   COLONOSCOPY WITH PROPOFOL N/A 05/16/2018   Procedure: COLONOSCOPY WITH PROPOFOL;  Surgeon: Midge Minium, MD;  Location: [facility];  Service: Endoscopy;  Laterality: N/A;   KNEE ARTHROSCOPY     POLYPECTOMY  05/16/2018   Procedure: POLYPECTOMY;  Surgeon: Midge Minium, MD;  Location: [facility];  Service: Endoscopy;;   TENOTOMY ACHILLES TENDON Right 08/22/2018   VASECTOMY  1990    Home Medications:  Allergies as of 05/01/2023   No Known Allergies      Medication List        Accurate as of May 01, 2023 10:19 AM. If you have any questions, ask your nurse or doctor.          Celery Seed Oil Take 1 capsule by mouth in the morning and at bedtime.   Eliquis 5 MG Tabs tablet Generic drug: apixaban TAKE 1 TABLET BY MOUTH TWICE A DAY   ezetimibe 10 MG tablet Commonly known as:  ZETIA TAKE 1 TABLET BY MOUTH EVERY DAY   finasteride 5 MG tablet Commonly known as: PROSCAR Take 1 tablet (5 mg total) by mouth daily.   Fish Oil 1200 MG Caps Take 2,400-3,600 capsules by mouth daily. Take 2,400 mg by mouth in the morning and 3,600 mg in the evening   glucosamine-chondroitin 500-400 MG tablet Take 1 tablet by mouth 2 (two) times daily.   Jatenzo 198 MG Caps Generic drug: Testosterone Undecanoate Take 1 tablet by mouth 2 (two) times daily. What changed:  medication strength how much to take when to take this   Magnesium 300 MG Caps Take 300 mg by mouth at bedtime.   silodosin 8 MG Caps capsule Commonly known as: RAPAFLO Take 1 capsule (8 mg total) by mouth daily with breakfast.   tadalafil 20 MG tablet Commonly known as: CIALIS TAKE ONE TABLET BY MOUTH DAILY AS NEEDED FOR ERECTILE DYSFUNCTION   valACYclovir 1000 MG tablet Commonly known as: VALTREX Take 0.5 tablets (500 mg total) by mouth daily.   vitamin D3 50 MCG (2000 UT) Caps Take 6,000 Units by mouth.   vitamin E 180 MG (400 UNITS) capsule Take 400 Units by mouth daily.        Allergies: No Known Allergies  Family History: Family History  Problem Relation Age of  Onset   Breast cancer Mother    Heart disease Mother    Hyperlipidemia Mother    Hypertension Mother    Colon cancer Father    Heart disease Father    Alzheimer's disease Father    Hyperlipidemia Father    Multiple sclerosis Sister        primary progessive MS   Hyperlipidemia Brother    Cancer Brother 49       Pancreatic 2020    Social History:  reports that he has never smoked. He has never used smokeless tobacco. He reports that he does not currently use alcohol after a past usage of about 7.0 standard drinks of alcohol per week. He reports that he does not use drugs.  ROS: All other review of systems were reviewed and are negative except what is noted above in HPI  Physical Exam: BP 128/73   Pulse 77    Constitutional:  Alert and oriented, No acute distress. HEENT: [place] AT, moist mucus membranes.  Trachea midline, no masses. Cardiovascular: No clubbing, cyanosis, or edema. Respiratory: Normal respiratory effort, no increased work of breathing. GI: Abdomen is soft, nontender, nondistended, no abdominal masses GU: No CVA tenderness.  Lymph: No cervical or inguinal lymphadenopathy. Skin: No rashes, bruises or suspicious lesions. Neurologic: Grossly intact, no focal deficits, moving all 4 extremities. Psychiatric: Normal mood and affect.  Laboratory Data: Lab Results  Component Value Date   WBC 4.8 01/24/2023   HGB 15.2 01/24/2023   HCT 45.6 01/24/2023   MCV 89 01/24/2023   PLT 133.0 (L) 03/22/2022    Lab Results  Component Value Date   CREATININE 1.10 04/24/2023    Lab Results  Component Value Date   PSA 3.61 10/21/2017   PSA 3.47 06/29/2016   PSA 2.34 04/18/2015    Lab Results  Component Value Date   TESTOSTERONE 295 04/24/2023    Lab Results  Component Value Date   HGBA1C 5.5 09/15/2019    Urinalysis    Component Value Date/Time   APPEARANCEUR Clear 02/01/2023 1011   GLUCOSEU Negative 02/01/2023 1011   BILIRUBINUR Negative 02/01/2023 1011   KETONESUR negative 05/29/2022 1346   PROTEINUR Negative 02/01/2023 1011   UROBILINOGEN 0.2 05/29/2022 1346   NITRITE Negative 02/01/2023 1011   LEUKOCYTESUR Negative 02/01/2023 1011    Lab Results  Component Value Date   LABMICR Comment 02/01/2023    Pertinent Imaging:  No results found for this or any previous visit.  No results found for this or any previous visit.  No results found for this or any previous visit.  No results found for this or any previous visit.  No results found for this or any previous visit.  No valid procedures specified. No results found for this or any previous visit.  No results found for this or any previous visit.   Assessment & Plan:    1. Male hypogonadism -increase  jatenzo to 198mg  BID - Testosterone,Free and Total; Future - CBC; Future - Comprehensive metabolic panel; Future  2. Nocturia -rapaflo 8mg  daily  3. Erectile dysfunction due to arterial insufficiency -patient defers therapy at this time  4. Benign prostatic hyperplasia with urinary obstruction -rapaflo 8mg  daily   No follow-ups on file.  Wilkie Aye, MD  [facility] [place]

## 2023-05-07 ENCOUNTER — Encounter: Payer: Self-pay | Admitting: Urology

## 2023-05-07 NOTE — Patient Instructions (Signed)

## 2023-05-28 ENCOUNTER — Ambulatory Visit: Payer: Medicare PPO | Admitting: General Practice

## 2023-05-28 ENCOUNTER — Ambulatory Visit
Admission: RE | Admit: 2023-05-28 | Discharge: 2023-05-28 | Disposition: A | Discharge status: Home / Self Care | Payer: Medicare PPO | Attending: Gastroenterology | Admitting: Gastroenterology

## 2023-05-28 ENCOUNTER — Encounter
Admission: RE | Disposition: A | Discharge status: Home / Self Care | Payer: Self-pay | Source: Home / Self Care | Attending: Gastroenterology

## 2023-05-28 DIAGNOSIS — Z860100 Personal history of colon polyps, unspecified: Secondary | ICD-10-CM

## 2023-05-28 DIAGNOSIS — R011 Cardiac murmur, unspecified: Secondary | ICD-10-CM | POA: Diagnosis not present

## 2023-05-28 DIAGNOSIS — K641 Second degree hemorrhoids: Secondary | ICD-10-CM | POA: Diagnosis not present

## 2023-05-28 DIAGNOSIS — Z1211 Encounter for screening for malignant neoplasm of colon: Secondary | ICD-10-CM | POA: Diagnosis not present

## 2023-05-28 DIAGNOSIS — F32A Depression, unspecified: Secondary | ICD-10-CM | POA: Diagnosis not present

## 2023-05-28 DIAGNOSIS — E785 Hyperlipidemia, unspecified: Secondary | ICD-10-CM | POA: Diagnosis not present

## 2023-05-28 DIAGNOSIS — Z86718 Personal history of other venous thrombosis and embolism: Secondary | ICD-10-CM | POA: Insufficient documentation

## 2023-05-28 DIAGNOSIS — D120 Benign neoplasm of cecum: Secondary | ICD-10-CM | POA: Diagnosis not present

## 2023-05-28 DIAGNOSIS — Z7901 Long term (current) use of anticoagulants: Secondary | ICD-10-CM | POA: Insufficient documentation

## 2023-05-28 DIAGNOSIS — I4891 Unspecified atrial fibrillation: Secondary | ICD-10-CM | POA: Diagnosis not present

## 2023-05-28 DIAGNOSIS — D125 Benign neoplasm of sigmoid colon: Secondary | ICD-10-CM | POA: Insufficient documentation

## 2023-05-28 DIAGNOSIS — K635 Polyp of colon: Secondary | ICD-10-CM

## 2023-05-28 DIAGNOSIS — I4892 Unspecified atrial flutter: Secondary | ICD-10-CM | POA: Diagnosis not present

## 2023-05-28 HISTORY — PX: COLONOSCOPY WITH PROPOFOL: SHX5780

## 2023-05-28 SURGERY — COLONOSCOPY WITH PROPOFOL
Anesthesia: General

## 2023-05-28 MED ORDER — PROPOFOL 1000 MG/100ML IV EMUL
INTRAVENOUS | Status: AC
  Filled 2023-05-28: qty 100

## 2023-05-28 MED ORDER — SODIUM CHLORIDE 0.9 % IV SOLN: INTRAVENOUS | Status: DC

## 2023-05-28 MED ORDER — PROPOFOL 500 MG/50ML IV EMUL
INTRAVENOUS | Status: DC | PRN
  Administered 2023-05-28: 70 mg via INTRAVENOUS
  Administered 2023-05-28: 150 ug/kg/min via INTRAVENOUS

## 2023-05-28 MED ORDER — SODIUM CHLORIDE 0.9 % IV SOLN: INTRAVENOUS | Status: DC | PRN

## 2023-05-28 MED ORDER — PROPOFOL 10 MG/ML IV BOLUS
INTRAVENOUS | Status: AC
  Filled 2023-05-28: qty 20

## 2023-05-28 MED ORDER — ONDANSETRON HCL 4 MG/2ML IJ SOLN
INTRAMUSCULAR | Status: DC | PRN
  Administered 2023-05-28: 4 mg via INTRAVENOUS

## 2023-05-28 NOTE — Transfer of Care (Signed)
Immediate Anesthesia Transfer of Care Note  Patient: Harold Schwartz  Procedure(s) Performed: COLONOSCOPY WITH PROPOFOL  Patient Location: PACU  Anesthesia Type:MAC  Level of Consciousness: drowsy  Airway & Oxygen Therapy: Patient Spontanous Breathing  Post-op Assessment: Report given to RN and Post -op Vital signs reviewed and stable  Post vital signs: Reviewed  Last Vitals:  Vitals Value Taken Time  BP 99/74 05/28/23 0809  Temp 36.2 C 05/28/23 0809  Pulse 60 05/28/23 0809  Resp 10 05/28/23 0809  SpO2 98 % 05/28/23 0809    Last Pain:  Vitals:   05/28/23 0809  TempSrc: Temporal  PainSc: Asleep         Complications: No notable events documented.

## 2023-05-28 NOTE — Anesthesia Preprocedure Evaluation (Addendum)
Anesthesia Evaluation  Patient identified by MRN, date of birth, ID band Patient awake    Reviewed: Allergy & Precautions, H&P , NPO status , Patient's Chart, lab work & pertinent test results, reviewed documented beta blocker date and time   Airway Mallampati: II  TM Distance: >3 FB Neck ROM: full    Dental no notable dental hx. (+) Dental Advidsory Given   Pulmonary neg pulmonary ROS   Pulmonary exam normal breath sounds clear to auscultation       Cardiovascular Exercise Tolerance: Good + DVT (March 2019)  negative cardio ROS Normal cardiovascular exam+ dysrhythmias Atrial Fibrillation  Rhythm:regular Rate:Normal     Neuro/Psych    Depression    negative neurological ROS  negative psych ROS   GI/Hepatic negative GI ROS, Neg liver ROS,,,  Endo/Other  negative endocrine ROS    Renal/GU negative Renal ROS  negative genitourinary   Musculoskeletal   Abdominal   Peds  Hematology negative hematology ROS (+)   Anesthesia Other Findings Past Medical History: No date: Atrial flutter (HCC) No date: Colon polyps No date: Depression No date: DVT (deep vein thrombosis) in pregnancy     Comment:  last one in March 2019 taking eliquis No date: Genital herpes No date: Heart murmur No date: Hemorrhoids No date: Hyperlipidemia No date: Hypogonadism in male No date: Neuromuscular disorder (HCC)     Comment:  slight numbnes in right foot  Past Surgical History: 03/22/2017: A-FLUTTER ABLATION; N/A     Comment:  Procedure: A-Flutter Ablation;  Surgeon: Duke Salvia,              MD;  [facility]: [facility];  Service:               Cardiovascular;  Laterality: N/A; 05/16/2018: COLONOSCOPY WITH PROPOFOL; N/A     Comment:  Procedure: COLONOSCOPY WITH PROPOFOL;  Surgeon: Midge Minium, MD;  Location: [facility];  Service:               Endoscopy;  Laterality: N/A; No date: KNEE  ARTHROSCOPY 05/16/2018: POLYPECTOMY     Comment:  Procedure: POLYPECTOMY;  Surgeon: Midge Minium, MD;                Location: [facility];  Service: Endoscopy;; 08/22/2018: TENOTOMY ACHILLES TENDON; Right 1990: VASECTOMY  BMI    Body Mass Index: 28.73 kg/m      Reproductive/Obstetrics negative OB ROS                             Anesthesia Physical Anesthesia Plan  ASA: 3  Anesthesia Plan: General   Post-op Pain Management:    Induction: Intravenous  PONV Risk Score and Plan: 2 and Ondansetron, Propofol infusion and TIVA  Airway Management Planned: Natural Airway and Nasal Cannula  Additional Equipment:   Intra-op Plan:   Post-operative Plan:   Informed Consent: I have reviewed the patients History and Physical, chart, labs and discussed the procedure including the risks, benefits and alternatives for the proposed anesthesia with the patient or authorized representative who has indicated his/her understanding and acceptance.     Dental Advisory Given  Plan Discussed with: CRNA  Anesthesia Plan Comments: (Patient consented for risks of anesthesia including but not limited to:  - adverse reactions to medications - risk of airway placement if required - damage to eyes,  teeth, lips or other oral mucosa - nerve damage due to positioning  - sore throat or hoarseness - Damage to heart, brain, nerves, lungs, other parts of body or loss of life  Patient voiced understanding.)        Anesthesia Quick Evaluation

## 2023-05-28 NOTE — Anesthesia Postprocedure Evaluation (Signed)
Anesthesia Post Note  Patient: Harold Schwartz  Procedure(s) Performed: COLONOSCOPY WITH PROPOFOL  Patient location during evaluation: Endoscopy Anesthesia Type: General Level of consciousness: awake and alert Pain management: pain level controlled Vital Signs Assessment: post-procedure vital signs reviewed and stable Respiratory status: spontaneous breathing, nonlabored ventilation, respiratory function stable and patient connected to nasal cannula oxygen Cardiovascular status: blood pressure returned to baseline and stable Postop Assessment: no apparent nausea or vomiting Anesthetic complications: no  No notable events documented.   Last Vitals:  Vitals:   05/28/23 0819 05/28/23 0829  BP: (!) 117/91 134/85  Pulse: 66 (!) 55  Resp: 15 13  Temp:    SpO2: 96% 100%    Last Pain:  Vitals:   05/28/23 0829  TempSrc:   PainSc: 0-No pain                 Stephanie Coup

## 2023-05-28 NOTE — H&P (Signed)
Midge Minium, MD [facility] [address], [address] [place], [place] [address] [phone] Fax : [phone]  Primary Care Physician:  Eden Emms, NP Primary Gastroenterologist:  Dr. Servando Snare  Pre-Procedure History & Physical: HPI:  Harold Schwartz is a 67 y.o. male is here for an colonoscopy.   Past Medical History:  Diagnosis Date   Atrial flutter (HCC)    Colon polyps    Depression    DVT (deep vein thrombosis) in pregnancy    last one in March 2019 taking eliquis   Genital herpes    Heart murmur    Hemorrhoids    Hyperlipidemia    Hypogonadism in male    Neuromuscular disorder (HCC)    slight numbnes in right foot    Past Surgical History:  Procedure Laterality Date   A-FLUTTER ABLATION N/A 03/22/2017   Procedure: A-Flutter Ablation;  Surgeon: Duke Salvia, MD;  [facility]: [facility];  Service: Cardiovascular;  Laterality: N/A;   COLONOSCOPY WITH PROPOFOL N/A 05/16/2018   Procedure: COLONOSCOPY WITH PROPOFOL;  Surgeon: Midge Minium, MD;  Location: [facility];  Service: Endoscopy;  Laterality: N/A;   KNEE ARTHROSCOPY     POLYPECTOMY  05/16/2018   Procedure: POLYPECTOMY;  Surgeon: Midge Minium, MD;  Location: [facility];  Service: Endoscopy;;   TENOTOMY ACHILLES TENDON Right 08/22/2018   VASECTOMY  1990    Prior to Admission medications   Medication Sig Start Date End Date Taking? Authorizing Provider  ezetimibe (ZETIA) 10 MG tablet TAKE 1 TABLET BY MOUTH EVERY DAY 01/01/23  Yes Eden Emms, NP  finasteride (PROSCAR) 5 MG tablet Take 1 tablet (5 mg total) by mouth daily. 02/01/23  Yes McKenzie, Mardene Celeste, MD  silodosin (RAPAFLO) 8 MG CAPS capsule Take 1 capsule (8 mg total) by mouth daily with breakfast. 05/01/23  Yes McKenzie, Mardene Celeste, MD  Testosterone Undecanoate (JATENZO) 198 MG CAPS Take 1 tablet by mouth 2 (two) times daily. 05/01/23  Yes McKenzie, Mardene Celeste, MD  Celery Seed OIL Take 1 capsule by mouth in the morning and at bedtime.      [provider]  Cholecalciferol (VITAMIN D3) 50 MCG (2000 UT) CAPS Take 6,000 Units by mouth.    [provider]  ELIQUIS 5 MG TABS tablet TAKE 1 TABLET BY MOUTH TWICE A DAY 01/22/23   Eden Emms, NP  glucosamine-chondroitin 500-400 MG tablet Take 1 tablet by mouth 2 (two) times daily.    [provider]  Magnesium 300 MG CAPS Take 300 mg by mouth at bedtime.     [provider]  Omega-3 Fatty Acids (FISH OIL) 1200 MG CAPS Take 2,400-3,600 capsules by mouth daily. Take 2,400 mg by mouth in the morning and 3,600 mg in the evening    [provider]  tadalafil (CIALIS) 20 MG tablet TAKE ONE TABLET BY MOUTH DAILY AS NEEDED FOR ERECTILE DYSFUNCTION 02/01/23   McKenzie, Mardene Celeste, MD  valACYclovir (VALTREX) 1000 MG tablet Take 0.5 tablets (500 mg total) by mouth daily. 12/21/22   Eden Emms, NP  vitamin E 180 MG (400 UNITS) capsule Take 400 Units by mouth daily.    [provider]    Allergies as of 01/17/2023   (No Known Allergies)    Family History  Problem Relation Age of Onset   Breast cancer Mother    Heart disease Mother    Hyperlipidemia Mother    Hypertension Mother    Colon cancer Father  Heart disease Father    Alzheimer's disease Father    Hyperlipidemia Father    Multiple sclerosis Sister        primary progessive MS   Hyperlipidemia Brother    Cancer Brother 55       Pancreatic 2020    Social History   Socioeconomic History   Marital status: Married    Spouse name: Vivan   Number of children: 2   Years of education: Not on file   Highest education level: Not on file  Occupational History   Occupation: TEFL teacher  Tobacco Use   Smoking status: Never   Smokeless tobacco: Never  Vaping Use   Vaping Use: Never used  Substance and Sexual Activity   Alcohol use: Not Currently    Alcohol/week: 7.0 standard drinks of alcohol    Types: 7 Glasses of wine per week    Comment: moderate   Drug use: No    Sexual activity: Yes  Other Topics Concern   Not on file  Social History Narrative   Semi retired: works 10-12 hours a month.   Self employed    Social Determinants of Corporate investment banker Strain: Not on file  Food Insecurity: No Food Insecurity (08/10/2022)   Hunger Vital Sign    Worried About Running Out of Food in the Last Year: Never true    Ran Out of Food in the Last Year: Never true  Transportation Needs: No Transportation Needs (08/10/2022)   PRAPARE - Administrator, Civil Service (Medical): No    Lack of Transportation (Non-Medical): No  Physical Activity: Not on file  Stress: Not on file  Social Connections: Not on file  Intimate Partner Violence: Not on file    Review of Systems: See HPI, otherwise negative ROS  Physical Exam: BP 121/83   Pulse 66   Temp (!) 96.4 F (35.8 C) (Temporal)   Resp 16   Ht 5\' 11"  (1.803 m)   Wt 93.4 kg   SpO2 100%   BMI 28.73 kg/m  General:   Alert,  pleasant and cooperative in NAD Head:  Normocephalic and atraumatic. Neck:  Supple; no masses or thyromegaly. Lungs:  Clear throughout to auscultation.    Heart:  Regular rate and rhythm. Abdomen:  Soft, nontender and nondistended. Normal bowel sounds, without guarding, and without rebound.   Neurologic:  Alert and  oriented x4;  grossly normal neurologically.  Impression/Plan: Harold Schwartz is here for an colonoscopy to be performed for a history of adenomatous polyps on 2019   Risks, benefits, limitations, and alternatives regarding  colonoscopy have been reviewed with the patient.  Questions have been answered.  All parties agreeable.   Midge Minium, MD  05/28/2023, 7:27 AM

## 2023-05-28 NOTE — Op Note (Signed)
[facility] Patient Name: Harold Schwartz Procedure Date: 05/28/2023 7:45 AM MRN: [id number] Account #: [id number] Date of Birth: 06/24/1956 Admit Type: Outpatient Age: 67 Room: [facility] ENDO ROOM 4 Gender: Male Note Status: Finalized Instrument Name: Prentice Docker [id number] Procedure:             Colonoscopy Indications:           High risk colon cancer surveillance: Personal history                         of colonic polyps Providers:             Midge Minium MD, MD Referring MD:          Genene Churn. Cable (Referring MD) Medicines:             Propofol per Anesthesia Complications:         No immediate complications. Procedure:             Pre-Anesthesia Assessment:                        - Prior to the procedure, a History and Physical was                         performed, and patient medications and allergies were                         reviewed. The patient's tolerance of previous                         anesthesia was also reviewed. The risks and benefits                         of the procedure and the sedation options and risks                         were discussed with the patient. All questions were                         answered, and informed consent was obtained. Prior                         Anticoagulants: The patient has taken no anticoagulant                         or antiplatelet agents. ASA Grade Assessment: II - A                         patient with mild systemic disease. After reviewing                         the risks and benefits, the patient was deemed in                         satisfactory condition to undergo the procedure.                        After obtaining informed consent, the colonoscope was  passed under direct vision. Throughout the procedure,                         the patient's blood pressure, pulse, and oxygen                         saturations were monitored continuously. The                          Colonoscope was introduced through the anus and                         advanced to the the cecum, identified by appendiceal                         orifice and ileocecal valve. The colonoscopy was                         performed without difficulty. The patient tolerated                         the procedure well. The quality of the bowel                         preparation was excellent. Findings:      The perianal and digital rectal examinations were normal.      A 3 mm polyp was found in the cecum. The polyp was sessile. The polyp       was removed with a cold snare. Resection and retrieval were complete.      A 3 mm polyp was found in the sigmoid colon. The polyp was sessile. The       polyp was removed with a cold snare. Resection and retrieval were       complete.      Non-bleeding internal hemorrhoids were found during retroflexion. The       hemorrhoids were Grade II (internal hemorrhoids that prolapse but reduce       spontaneously). Impression:            - One 3 mm polyp in the cecum, removed with a cold                         snare. Resected and retrieved.                        - One 3 mm polyp in the sigmoid colon, removed with a                         cold snare. Resected and retrieved.                        - Non-bleeding internal hemorrhoids. Recommendation:        - Discharge patient to home.                        - Resume previous diet.                        - Continue present medications.                        -  Await pathology results.                        - Repeat colonoscopy in 7 years for surveillance. Procedure Code(s):     --- Professional ---                        [id number], Colonoscopy, flexible; with removal of                         tumor(s), polyp(s), or other lesion(s) by snare                         technique Diagnosis Code(s):     --- Professional ---                        Z86.010, Personal history of colonic polyps                         D12.5, Benign neoplasm of sigmoid colon CPT copyright 2022 American Medical Association. All rights reserved. The codes documented in this report are preliminary and upon coder review may  be revised to meet current compliance requirements. Midge Minium MD, MD 05/28/2023 8:08:22 AM This report has been signed electronically. Number of Addenda: 0 Note Initiated On: 05/28/2023 7:45 AM Scope Withdrawal Time: 0 hours 13 minutes 21 seconds  Total Procedure Duration: 0 hours 16 minutes 31 seconds  Estimated Blood Loss:  Estimated blood loss: none.      [facility]

## 2023-05-29 ENCOUNTER — Encounter: Payer: Self-pay | Admitting: Gastroenterology

## 2023-06-05 ENCOUNTER — Other Ambulatory Visit: Payer: Self-pay | Admitting: Nurse Practitioner

## 2023-06-05 DIAGNOSIS — A6000 Herpesviral infection of urogenital system, unspecified: Secondary | ICD-10-CM

## 2023-06-17 NOTE — Progress Notes (Unsigned)
Subjective:   Harold Schwartz is a 67 y.o. male who presents for Medicare Annual/Subsequent preventive examination.  Visit Complete: {[phone]}  Patient Medicare AWV questionnaire was completed by the patient on ***; I have confirmed that all information answered by patient is correct and no changes since this date.  Review of Systems    ***       Objective:    There were no vitals filed for this visit. There is no height or weight on file to calculate BMI.     05/28/2023    7:03 AM 05/16/2018    7:51 AM 02/25/2018    7:04 PM 03/22/2017    6:07 AM 03/04/2017    2:34 PM 11/21/2016    1:58 PM 10/05/2016   10:53 AM  Advanced Directives  Does Patient Have a Medical Advance Directive? Yes Yes Yes Yes Yes Yes Yes  Type of Estate agent of [place];Living will Healthcare Power of [place];Living will Healthcare Power of [employer] of [place];Living will Healthcare Power of [place];Living will Healthcare Power of [place];Living will Healthcare Power of [place];Living will  Does patient want to make changes to medical advance directive?  No - Patient declined  No - Patient declined No - Patient declined    Copy of Healthcare Power of Attorney in Chart?  No - copy requested Yes Yes No - copy requested      Current Medications (verified) Outpatient Encounter Medications as of 06/18/2023  Medication Sig   Celery Seed OIL Take 1 capsule by mouth in the morning and at bedtime.    Cholecalciferol (VITAMIN D3) 50 MCG (2000 UT) CAPS Take 6,000 Units by mouth.   ELIQUIS 5 MG TABS tablet TAKE 1 TABLET BY MOUTH TWICE A DAY   ezetimibe (ZETIA) 10 MG tablet TAKE 1 TABLET BY MOUTH EVERY DAY   finasteride (PROSCAR) 5 MG tablet Take 1 tablet (5 mg total) by mouth daily.   glucosamine-chondroitin 500-400 MG tablet Take 1 tablet by mouth 2 (two) times daily.   Magnesium 300 MG CAPS Take 300 mg by mouth at bedtime.    Omega-3 Fatty Acids (FISH OIL) 1200  MG CAPS Take 2,400-3,600 capsules by mouth daily. Take 2,400 mg by mouth in the morning and 3,600 mg in the evening   silodosin (RAPAFLO) 8 MG CAPS capsule Take 1 capsule (8 mg total) by mouth daily with breakfast.   tadalafil (CIALIS) 20 MG tablet TAKE ONE TABLET BY MOUTH DAILY AS NEEDED FOR ERECTILE DYSFUNCTION   Testosterone Undecanoate (JATENZO) 198 MG CAPS Take 1 tablet by mouth 2 (two) times daily.   valACYclovir (VALTREX) 1000 MG tablet TAKE 0.5 TABLETS BY MOUTH DAILY.   vitamin E 180 MG (400 UNITS) capsule Take 400 Units by mouth daily.   No facility-administered encounter medications on file as of 06/18/2023.    Allergies (verified) Patient has no known allergies.   History: Past Medical History:  Diagnosis Date   Atrial flutter (HCC)    Colon polyps    Depression    DVT (deep vein thrombosis) in pregnancy    last one in March 2019 taking eliquis   Genital herpes    Heart murmur    Hemorrhoids    Hyperlipidemia    Hypogonadism in male    Neuromuscular disorder (HCC)    slight numbnes in right foot   Past Surgical History:  Procedure Laterality Date   A-FLUTTER ABLATION N/A 03/22/2017   Procedure: A-Flutter Ablation;  Surgeon: Duke Salvia, MD;  Location: [facility]IVE [facility];  Service: Cardiovascular;  Laterality: N/A;   COLONOSCOPY WITH PROPOFOL N/A 05/16/2018   Procedure: COLONOSCOPY WITH PROPOFOL;  Surgeon: Midge Minium, MD;  Location: [facility];  Service: Endoscopy;  Laterality: N/A;   COLONOSCOPY WITH PROPOFOL N/A 05/28/2023   Procedure: COLONOSCOPY WITH PROPOFOL;  Surgeon: Midge Minium, MD;  Location[facility] END[facility]COPY;  Service: Endoscopy;  Laterality: N/A;   KNEE ARTHROSCOPY     POLYPECTOMY  05/16/2018   Procedure: POLYPECTOMY;  Surgeon: Midge Minium, MD;  Location: [facility];  Service: Endoscopy;;   TENOTOMY ACHILLES TENDON Right 08/22/2018   VASECTOMY  1990   Family History  Problem Relation Age of Onset   Breast cancer Mother    Heart  disease Mother    Hyperlipidemia Mother    Hypertension Mother    Colon cancer Father    Heart disease Father    Alzheimer's disease Father    Hyperlipidemia Father    Multiple sclerosis Sister        primary progessive MS   Hyperlipidemia Brother    Cancer Brother 34       Pancreatic 2020   Social History   Socioeconomic History   Marital status: Married    Spouse name: Vivan   Number of children: 2   Years of education: Not on file   Highest education level: Not on file  Occupational History   Occupation: TEFL teacher  Tobacco Use   Smoking status: Never   Smokeless tobacco: Never  Vaping Use   Vaping Use: Never used  Substance and Sexual Activity   Alcohol use: Not Currently    Alcohol/week: 7.0 standard drinks of alcohol    Types: 7 Glasses of wine per week    Comment: moderate   Drug use: No   Sexual activity: Yes  Other Topics Concern   Not on file  Social History Narrative   Semi retired: works 10-12 hours a month.   Self employed    Social Determinants of Corporate investment banker Strain: Not on file  Food Insecurity: No Food Insecurity (08/10/2022)   Hunger Vital Sign    Worried About Running Out of Food in the Last Year: Never true    Ran Out of Food in the Last Year: Never true  Transportation Needs: No Transportation Needs (08/10/2022)   PRAPARE - Administrator, Civil Service (Medical): No    Lack of Transportation (Non-Medical): No  Physical Activity: Not on file  Stress: Not on file  Social Connections: Not on file    Tobacco Counseling Counseling given: Not Answered   Clinical Intake:                        Activities of Daily Living     No data to display          Patient Care Team: Eden Emms, NP as PCP - General Duke Salvia, MD as Consulting Physician (Cardiology)  Indicate any recent Medical Services you may have received from other than [facility] providers in the past year (date may be  approximate).     Assessment:   This is a routine wellness examination for [name].  Hearing/Vision screen No results found.  Dietary issues and exercise activities discussed:     Goals Addressed   None    Depression Screen    04/22/2023    1:59 PM 04/27/2022   10:03 AM 03/22/2022    8:07  AM 12/19/2020   12:20 PM 09/14/2019   12:13 PM 03/23/2019    8:08 AM 10/21/2017   12:18 PM  PHQ 2/9 Scores  PHQ - 2 Score 2 0 0 0 0 0 0  PHQ- 9 Score 3          Fall Risk    04/22/2023    1:59 PM 04/27/2022   10:03 AM 03/22/2022    8:06 AM 12/19/2020   12:20 PM 03/04/2017    2:34 PM  Fall Risk   Falls in the past year? 0 0 0 0 Yes  Number falls in past yr: 0 0 0  1  Injury with Fall? 0 0 0  No  Risk for fall due to : No Fall Risks    Other (Comment)  Risk for fall due to: Comment     Felled off ladder.  Follow up Falls evaluation completed    Falls prevention discussed    MEDICARE RISK AT HOME:   TIMED UP AND GO:  Was the test performed?  No    Cognitive Function:        Immunizations Immunization History  Administered Date(s) Administered   Fluad Quad(high Dose 65+) 09/08/2021, 08/22/2022   Influenza Inj Mdck Quad Pf 10/24/2018   Influenza, High Dose Seasonal PF 09/01/2020   Influenza,inj,Quad PF,6+ Mos 09/09/2015, 10/26/2016, 10/21/2017, 10/24/2018, 08/11/2019   Influenza-Unspecified 10/17/2013   PFIZER Comirnaty(Gray Top)Covid-19 Tri-Sucrose Vaccine 03/17/2021   PFIZER(Purple Top)SARS-COV-2 Vaccination 01/15/2020, 02/08/2020, 09/01/2020   Pfizer Covid-19 Vaccine Bivalent Booster 2yrs & up 09/08/2021, 08/04/2022, 10/29/2022   Pneumococcal Conjugate-13 12/19/2020   Pneumococcal Polysaccharide-23 08/22/2022   Pneumococcal-Unspecified 01/18/2012   Respiratory Syncytial Virus Vaccine,Recomb Aduvanted(Arexvy) 08/04/2022   Tdap 04/25/2015   Zoster Recombinant(Shingrix) 01/12/2022, 04/20/2022   Zoster, Live 05/03/2015    TDAP status: Up to date  Pneumococcal vaccine status:  Up to date  Covid-19 vaccine status: Information provided on how to obtain vaccines.   Qualifies for Shingles Vaccine? Yes   Zostavax completed Yes   Shingrix Completed?: Yes  Screening Tests Health Maintenance  Topic Date Due   COVID-19 Vaccine (8 - 2023-24 season) 12/24/2022   Medicare Annual Wellness (AWV)  04/28/2023   INFLUENZA VACCINE  07/18/2023   DTaP/Tdap/Td (2 - Td or Tdap) 04/24/2025   Colonoscopy  05/27/2030   Pneumonia Vaccine 58+ Years old  Completed   Hepatitis C Screening  Completed   Zoster Vaccines- Shingrix  Completed   HPV VACCINES  Aged Out    Health Maintenance  Health Maintenance Due  Topic Date Due   COVID-19 Vaccine (8 - 2023-24 season) 12/24/2022   Medicare Annual Wellness (AWV)  04/28/2023    Colorectal cancer screening: Type of screening: Colonoscopy. Completed 05/28/23. Repeat every 7 years  Lung Cancer Screening: (Low Dose CT Chest recommended if Age 82-80 years, 20 pack-year currently smoking OR have quit w/in 15years.) does not qualify.   Lung Cancer Screening Referral: n/a  Additional Screening:  Hepatitis C Screening: does qualify; Completed 06/29/16  Vision Screening: Recommended annual ophthalmology exams for early detection of glaucoma and other disorders of the eye. Is the patient up to date with their annual eye exam?  {YES/NO:21197} Who is the provider or what is the name of the office in which the patient attends annual eye exams? *** If pt is not established with a provider, would they like to be referred to a provider to establish care? {YES/NO:21197}.   Dental Screening: Recommended annual dental exams for proper oral hygiene  Community  Resource Referral / Chronic Care Management: CRR required this visit?  {YES/NO:21197}  CCM required this visit?  {CCM Required choices:[phone]}     Plan:     I have personally reviewed and noted the following in the patient's chart:   Medical and social history Use of alcohol,  tobacco or illicit drugs  Current medications and supplements including opioid prescriptions. {Opioid Prescriptions:[phone]} Functional ability and status Nutritional status Physical activity Advanced directives List of other physicians Hospitalizations, surgeries, and ER visits in previous 12 months Vitals Screenings to include cognitive, depression, and falls Referrals and appointments  In addition, I have reviewed and discussed with patient certain preventive protocols, quality metrics, and best practice recommendations. A written personalized care plan for preventive services as well as general preventive health recommendations were provided to patient.     Kandis Fantasia [place], [place]   08/22/[birth year]   After Visit Summary: {CHL AMB AWV After Visit Summary:[phone]}  Nurse Notes: ***

## 2023-06-17 NOTE — Patient Instructions (Incomplete)
[name] , Thank you for taking time to come for your Medicare Wellness Visit. I appreciate your ongoing commitment to your health goals. Please review the following plan we discussed and let me know if I can assist you in the future.   These are the goals we discussed:  Goals   None     This is a list of the screening recommended for you and due dates:  Health Maintenance  Topic Date Due   COVID-19 Vaccine (8 - 2023-24 season) 12/24/2022   Medicare Annual Wellness Visit  04/28/2023   Flu Shot  07/18/2023   DTaP/Tdap/Td vaccine (2 - Td or Tdap) 04/24/2025   Colon Cancer Screening  05/27/2030   Pneumonia Vaccine  Completed   Hepatitis C Screening  Completed   Zoster (Shingles) Vaccine  Completed   HPV Vaccine  Aged Out    Advanced directives: We have a copy of your advanced directives available in your record should your provider ever need to access them.  Conditions/risks identified: Aim for 30 minutes of exercise or brisk walking, 6-8 glasses of water, and 5 servings of fruits and vegetables each day.  Next appointment: Follow up in one year for your annual wellness visit.   Preventive Care 31 Years and Older, Male  Preventive care refers to lifestyle choices and visits with your health care provider that can promote health and wellness. What does preventive care include? A yearly physical exam. This is also called an annual well check. Dental exams once or twice a year. Routine eye exams. Ask your health care provider how often you should have your eyes checked. Personal lifestyle choices, including: Daily care of your teeth and gums. Regular physical activity. Eating a healthy diet. Avoiding tobacco and drug use. Limiting alcohol use. Practicing safe sex. Taking low doses of aspirin every day. Taking vitamin and mineral supplements as recommended by your health care provider. What happens during an annual well check? The services and screenings done by your health  care provider during your annual well check will depend on your age, overall health, lifestyle risk factors, and family history of disease. Counseling  Your health care provider may ask you questions about your: Alcohol use. Tobacco use. Drug use. Emotional well-being. Home and relationship well-being. Sexual activity. Eating habits. History of falls. Memory and ability to understand (cognition). Work and work Astronomer. Screening  You may have the following tests or measurements: Height, weight, and BMI. Blood pressure. Lipid and cholesterol levels. These may be checked every 5 years, or more frequently if you are over 101 years old. Skin check. Lung cancer screening. You may have this screening every year starting at age 64 if you have a 30-pack-year history of smoking and currently smoke or have quit within the past 15 years. Fecal occult blood test (FOBT) of the stool. You may have this test every year starting at age 77. Flexible sigmoidoscopy or colonoscopy. You may have a sigmoidoscopy every 5 years or a colonoscopy every 10 years starting at age 68. Prostate cancer screening. Recommendations will vary depending on your family history and other risks. Hepatitis C blood test. Hepatitis B blood test. Sexually transmitted disease (STD) testing. Diabetes screening. This is done by checking your blood sugar (glucose) after you have not eaten for a while (fasting). You may have this done every 1-3 years. Abdominal aortic aneurysm (AAA) screening. You may need this if you are a current or former smoker. Osteoporosis. You may be screened starting at age 43  if you are at high risk. Talk with your health care provider about your test results, treatment options, and if necessary, the need for more tests. Vaccines  Your health care provider may recommend certain vaccines, such as: Influenza vaccine. This is recommended every year. Tetanus, diphtheria, and acellular pertussis (Tdap, Td)  vaccine. You may need a Td booster every 10 years. Zoster vaccine. You may need this after age 78. Pneumococcal 13-valent conjugate (PCV13) vaccine. One dose is recommended after age 63. Pneumococcal polysaccharide (PPSV23) vaccine. One dose is recommended after age 72. Talk to your health care provider about which screenings and vaccines you need and how often you need them. This information is not intended to replace advice given to you by your health care provider. Make sure you discuss any questions you have with your health care provider. Document Released: 12/30/2015 Document Revised: 08/22/2016 Document Reviewed: 10/04/2015 Elsevier Interactive Patient Education  2017 [place] Prevention in the Home Falls can cause injuries. They can happen to people of all ages. There are many things you can do to make your home safe and to help prevent falls. What can I do on the outside of my home? Regularly fix the edges of walkways and driveways and fix any cracks. Remove anything that might make you trip as you walk through a door, such as a raised step or threshold. Trim any bushes or trees on the path to your home. Use bright outdoor lighting. Clear any walking paths of anything that might make someone trip, such as rocks or tools. Regularly check to see if handrails are loose or broken. Make sure that both sides of any steps have handrails. Any raised decks and porches should have guardrails on the edges. Have any leaves, snow, or ice cleared regularly. Use sand or salt on walking paths during winter. Clean up any spills in your garage right away. This includes oil or grease spills. What can I do in the bathroom? Use night lights. Install grab bars by the toilet and in the tub and shower. Do not use towel bars as grab bars. Use non-skid mats or decals in the tub or shower. If you need to sit down in the shower, use a plastic, non-slip stool. Keep the floor dry. Clean up any  water that spills on the floor as soon as it happens. Remove soap buildup in the tub or shower regularly. Attach bath mats securely with double-sided non-slip rug tape. Do not have throw rugs and other things on the floor that can make you trip. What can I do in the bedroom? Use night lights. Make sure that you have a light by your bed that is easy to reach. Do not use any sheets or blankets that are too big for your bed. They should not hang down onto the floor. Have a firm chair that has side arms. You can use this for support while you get dressed. Do not have throw rugs and other things on the floor that can make you trip. What can I do in the kitchen? Clean up any spills right away. Avoid walking on wet floors. Keep items that you use a lot in easy-to-reach places. If you need to reach something above you, use a strong step stool that has a grab bar. Keep electrical cords out of the way. Do not use floor polish or wax that makes floors slippery. If you must use wax, use non-skid floor wax. Do not have throw rugs and other things  on the floor that can make you trip. What can I do with my stairs? Do not leave any items on the stairs. Make sure that there are handrails on both sides of the stairs and use them. Fix handrails that are broken or loose. Make sure that handrails are as long as the stairways. Check any carpeting to make sure that it is firmly attached to the stairs. Fix any carpet that is loose or worn. Avoid having throw rugs at the top or bottom of the stairs. If you do have throw rugs, attach them to the floor with carpet tape. Make sure that you have a light switch at the top of the stairs and the bottom of the stairs. If you do not have them, ask someone to add them for you. What else can I do to help prevent falls? Wear shoes that: Do not have high heels. Have rubber bottoms. Are comfortable and fit you well. Are closed at the toe. Do not wear sandals. If you use a  stepladder: Make sure that it is fully opened. Do not climb a closed stepladder. Make sure that both sides of the stepladder are locked into place. Ask someone to hold it for you, if possible. Clearly mark and make sure that you can see: Any grab bars or handrails. First and last steps. Where the edge of each step is. Use tools that help you move around (mobility aids) if they are needed. These include: Canes. Walkers. Scooters. Crutches. Turn on the lights when you go into a dark area. Replace any light bulbs as soon as they burn out. Set up your furniture so you have a clear path. Avoid moving your furniture around. If any of your floors are uneven, fix them. If there are any pets around you, be aware of where they are. Review your medicines with your doctor. Some medicines can make you feel dizzy. This can increase your chance of falling. Ask your doctor what other things that you can do to help prevent falls. This information is not intended to replace advice given to you by your health care provider. Make sure you discuss any questions you have with your health care provider. Document Released: 09/29/2009 Document Revised: 05/10/2016 Document Reviewed: 01/07/2015 Elsevier Interactive Patient Education  2017 [employer].

## 2023-06-18 ENCOUNTER — Ambulatory Visit (INDEPENDENT_AMBULATORY_CARE_PROVIDER_SITE_OTHER): Payer: Medicare PPO

## 2023-06-18 VITALS — Ht 71.0 in | Wt 206.0 lb

## 2023-06-18 DIAGNOSIS — Z0000 Encounter for general adult medical examination without abnormal findings: Secondary | ICD-10-CM

## 2023-07-02 ENCOUNTER — Other Ambulatory Visit: Payer: Self-pay | Admitting: Nurse Practitioner

## 2023-07-02 DIAGNOSIS — E781 Pure hyperglyceridemia: Secondary | ICD-10-CM

## 2023-08-02 ENCOUNTER — Telehealth: Payer: Self-pay

## 2023-08-02 ENCOUNTER — Other Ambulatory Visit: Payer: Self-pay

## 2023-08-02 DIAGNOSIS — E291 Testicular hypofunction: Secondary | ICD-10-CM

## 2023-08-02 DIAGNOSIS — R972 Elevated prostate specific antigen [PSA]: Secondary | ICD-10-CM

## 2023-08-02 NOTE — Telephone Encounter (Signed)
Patient called and left message about labs. Patient is made aware lab orders are in and patient voiced understanding.

## 2023-08-04 ENCOUNTER — Other Ambulatory Visit: Payer: Self-pay | Admitting: Nurse Practitioner

## 2023-08-04 DIAGNOSIS — I825Y2 Chronic embolism and thrombosis of unspecified deep veins of left proximal lower extremity: Secondary | ICD-10-CM

## 2023-08-06 DIAGNOSIS — R972 Elevated prostate specific antigen [PSA]: Secondary | ICD-10-CM | POA: Diagnosis not present

## 2023-08-06 DIAGNOSIS — E291 Testicular hypofunction: Secondary | ICD-10-CM | POA: Diagnosis not present

## 2023-08-08 LAB — COMPREHENSIVE METABOLIC PANEL
ALT: 16 IU/L (ref 0–44)
AST: 21 IU/L (ref 0–40)
Albumin: 4 g/dL (ref 3.9–4.9)
Alkaline Phosphatase: 47 IU/L (ref 44–121)
BUN/Creatinine Ratio: 15 (ref 10–24)
BUN: 16 mg/dL (ref 8–27)
Bilirubin Total: 0.6 mg/dL (ref 0.0–1.2)
CO2: 25 mmol/L (ref 20–29)
Calcium: 9.3 mg/dL (ref 8.6–10.2)
Chloride: 104 mmol/L (ref 96–106)
Creatinine, Ser: 1.08 mg/dL (ref 0.76–1.27)
Globulin, Total: 2.5 g/dL (ref 1.5–4.5)
Glucose: 90 mg/dL (ref 70–99)
Potassium: 4.5 mmol/L (ref 3.5–5.2)
Sodium: 141 mmol/L (ref 134–144)
Total Protein: 6.5 g/dL (ref 6.0–8.5)
eGFR: 75 mL/min/{1.73_m2} (ref >59)

## 2023-08-08 LAB — CBC
Hematocrit: 45.3 % (ref 37.5–51.0)
Hemoglobin: 15.1 g/dL (ref 13.0–17.7)
MCH: 30.3 pg (ref 26.6–33.0)
MCHC: 33.3 g/dL (ref 31.5–35.7)
MCV: 91 fL (ref 79–97)
Platelets: 165 10*3/uL (ref 150–450)
RBC: 4.98 x10E6/uL (ref 4.14–5.80)
RDW: 12.9 % (ref 11.6–15.4)
WBC: 5.3 10*3/uL (ref 3.4–10.8)

## 2023-08-08 LAB — TESTOSTERONE,FREE AND TOTAL
Testosterone, Free: 13.9 pg/mL (ref 6.6–18.1)
Testosterone: 262 ng/dL — ABNORMAL LOW (ref 264–916)

## 2023-08-09 ENCOUNTER — Encounter: Payer: Self-pay | Admitting: Urology

## 2023-08-09 ENCOUNTER — Ambulatory Visit: Payer: Medicare PPO | Admitting: Urology

## 2023-08-09 VITALS — BP 127/84 | HR 70

## 2023-08-09 DIAGNOSIS — N138 Other obstructive and reflux uropathy: Secondary | ICD-10-CM

## 2023-08-09 DIAGNOSIS — N401 Enlarged prostate with lower urinary tract symptoms: Secondary | ICD-10-CM

## 2023-08-09 DIAGNOSIS — R351 Nocturia: Secondary | ICD-10-CM | POA: Diagnosis not present

## 2023-08-09 DIAGNOSIS — E291 Testicular hypofunction: Secondary | ICD-10-CM

## 2023-08-09 LAB — URINALYSIS, ROUTINE W REFLEX MICROSCOPIC
Bilirubin, UA: NEGATIVE
Glucose, UA: NEGATIVE
Ketones, UA: NEGATIVE
Leukocytes,UA: NEGATIVE
Nitrite, UA: NEGATIVE
Protein,UA: NEGATIVE
RBC, UA: NEGATIVE
Specific Gravity, UA: 1.025 (ref 1.005–1.030)
Urobilinogen, Ur: 0.2 mg/dL (ref 0.2–1.0)
pH, UA: 6 (ref 5.0–7.5)

## 2023-08-09 MED ORDER — FINASTERIDE 5 MG PO TABS: 5.0000 mg | ORAL_TABLET | Freq: Every day | ORAL | 3 refills | Status: DC

## 2023-08-09 MED ORDER — JATENZO 237 MG PO CAPS: 1.0000 | ORAL_CAPSULE | Freq: Two times a day (BID) | ORAL | 3 refills | Status: DC

## 2023-08-09 MED ORDER — SILODOSIN 8 MG PO CAPS: 8.0000 mg | ORAL_CAPSULE | Freq: Two times a day (BID) | ORAL | 3 refills | Status: DC

## 2023-08-09 NOTE — Progress Notes (Unsigned)
08/09/2023 12:17 PM   Harold Schwartz 07-12-1956 [id number]  Referring provider: Eden Emms, NP [address] [place],  [place] [address]  No chief complaint on file.   HPI:    PMH: Past Medical History:  Diagnosis Date   Atrial flutter (HCC)    Colon polyps    Depression    DVT (deep vein thrombosis) in pregnancy    last one in March 2019 taking eliquis   Genital herpes    Heart murmur    Hemorrhoids    Hyperlipidemia    Hypogonadism in male    Neuromuscular disorder (HCC)    slight numbnes in right foot    Surgical History: Past Surgical History:  Procedure Laterality Date   A-FLUTTER ABLATION N/A 03/22/2017   Procedure: A-Flutter Ablation;  Surgeon: Duke Salvia, MD;  [facility];  Service: Cardiovascular;  Laterality: N/A;   COLONOSCOPY WITH PROPOFOL N/A 05/16/2018   Procedure: COLONOSCOPY WITH PROPOFOL;  Surgeon: Midge Minium, MD;  Location: [facility];  Service: Endoscopy;  Laterality: N/A;   COLONOSCOPY WITH PROPOFOL N/A 05/28/2023   Procedure: COLONOSCOPY WITH PROPOFOL;  Surgeon: Midge Minium, MD;  Location: [facility] ENDOSCOPY;  Service: Endoscopy;  Laterality: N/A;   KNEE ARTHROSCOPY     POLYPECTOMY  05/16/2018   Procedure: POLYPECTOMY;  Surgeon: Midge Minium, MD;  Location: [facility];  Service: Endoscopy;;   TENOTOMY ACHILLES TENDON Right 08/22/2018   VASECTOMY  1990    Home Medications:  Allergies as of 08/09/2023   No Known Allergies      Medication List        Accurate as of August 09, 2023 12:17 PM. If you have any questions, ask your nurse or doctor.          STOP taking these medications    Jatenzo 198 MG Caps Generic drug: Testosterone Undecanoate Stopped by: Wilkie Aye       TAKE these medications    Celery Seed Oil Take 1 capsule by mouth in the morning and at bedtime.   Eliquis 5 MG Tabs tablet Generic drug: apixaban TAKE 1 TABLET BY MOUTH TWICE A DAY   ezetimibe 10 MG  tablet Commonly known as: ZETIA TAKE 1 TABLET BY MOUTH EVERY DAY   finasteride 5 MG tablet Commonly known as: PROSCAR Take 1 tablet (5 mg total) by mouth daily.   Fish Oil 1200 MG Caps Take 2,400-3,600 capsules by mouth daily. Take 2,400 mg by mouth in the morning and 3,600 mg in the evening   glucosamine-chondroitin 500-400 MG tablet Take 1 tablet by mouth 2 (two) times daily.   Magnesium 300 MG Caps Take 300 mg by mouth at bedtime.   silodosin 8 MG Caps capsule Commonly known as: RAPAFLO Take 1 capsule (8 mg total) by mouth daily with breakfast.   tadalafil 20 MG tablet Commonly known as: CIALIS TAKE ONE TABLET BY MOUTH DAILY AS NEEDED FOR ERECTILE DYSFUNCTION   valACYclovir 1000 MG tablet Commonly known as: VALTREX TAKE 0.5 TABLETS BY MOUTH DAILY.   vitamin D3 50 MCG (2000 UT) Caps Take 6,000 Units by mouth.   vitamin E 180 MG (400 UNITS) capsule Take 400 Units by mouth daily.        Allergies: No Known Allergies  Family History: Family History  Problem Relation Age of Onset   Breast cancer Mother    Heart disease Mother    Hyperlipidemia Mother    Hypertension Mother    Colon cancer  Father    Heart disease Father    Alzheimer's disease Father    Hyperlipidemia Father    Multiple sclerosis Sister        primary progessive MS   Hyperlipidemia Brother    Cancer Brother 52       Pancreatic 2020    Social History:  reports that he has never smoked. He has never used smokeless tobacco. He reports that he does not currently use alcohol after a past usage of about 7.0 standard drinks of alcohol per week. He reports that he does not use drugs.  ROS: All other review of systems were reviewed and are negative except what is noted above in HPI  Physical Exam: BP 127/84   Pulse 70   Constitutional:  Alert and oriented, No acute distress. HEENT: [place] AT, moist mucus membranes.  Trachea midline, no masses. Cardiovascular: No clubbing, cyanosis, or  edema. Respiratory: Normal respiratory effort, no increased work of breathing. GI: Abdomen is soft, nontender, nondistended, no abdominal masses GU: No CVA tenderness.  Lymph: No cervical or inguinal lymphadenopathy. Skin: No rashes, bruises or suspicious lesions. Neurologic: Grossly intact, no focal deficits, moving all 4 extremities. Psychiatric: Normal mood and affect.  Laboratory Data: Lab Results  Component Value Date   WBC 5.3 08/06/2023   HGB 15.1 08/06/2023   HCT 45.3 08/06/2023   MCV 91 08/06/2023   PLT 165 08/06/2023    Lab Results  Component Value Date   CREATININE 1.08 08/06/2023    Lab Results  Component Value Date   PSA 3.61 10/21/2017   PSA 3.47 06/29/2016   PSA 2.34 04/18/2015    Lab Results  Component Value Date   TESTOSTERONE 262 (L) 08/06/2023    Lab Results  Component Value Date   HGBA1C 5.5 09/15/2019    Urinalysis    Component Value Date/Time   APPEARANCEUR Clear 02/01/2023 1011   GLUCOSEU Negative 02/01/2023 1011   BILIRUBINUR Negative 02/01/2023 1011   KETONESUR negative 05/29/2022 1346   PROTEINUR Negative 02/01/2023 1011   UROBILINOGEN 0.2 05/29/2022 1346   NITRITE Negative 02/01/2023 1011   LEUKOCYTESUR Negative 02/01/2023 1011    Lab Results  Component Value Date   LABMICR Comment 02/01/2023    Pertinent Imaging: *** No results found for this or any previous visit.  No results found for this or any previous visit.  No results found for this or any previous visit.  No results found for this or any previous visit.  No results found for this or any previous visit.  No valid procedures specified. No results found for this or any previous visit.  No results found for this or any previous visit.   Assessment & Plan:    1. Male hypogonadism *** - Urinalysis, Routine w reflex microscopic  2. Nocturia ***  3. Benign prostatic hyperplasia with urinary obstruction ***   No follow-ups on file.  Wilkie Aye, MD  [facility] [place]

## 2023-08-14 ENCOUNTER — Encounter: Payer: Self-pay | Admitting: Urology

## 2023-08-14 NOTE — Patient Instructions (Signed)

## 2023-08-19 ENCOUNTER — Encounter: Payer: Self-pay | Admitting: Urology

## 2023-08-21 ENCOUNTER — Other Ambulatory Visit: Payer: Self-pay | Admitting: Urology

## 2023-08-21 ENCOUNTER — Other Ambulatory Visit: Payer: Self-pay

## 2023-08-21 MED ORDER — SILODOSIN 8 MG PO CAPS: 8.0000 mg | ORAL_CAPSULE | Freq: Two times a day (BID) | ORAL | 3 refills | Status: DC

## 2023-08-23 ENCOUNTER — Encounter: Payer: Self-pay | Admitting: Nurse Practitioner

## 2023-09-09 ENCOUNTER — Other Ambulatory Visit: Payer: Self-pay | Admitting: Nurse Practitioner

## 2023-09-09 DIAGNOSIS — A6000 Herpesviral infection of urogenital system, unspecified: Secondary | ICD-10-CM

## 2023-11-05 ENCOUNTER — Other Ambulatory Visit: Payer: Self-pay

## 2023-11-05 DIAGNOSIS — E291 Testicular hypofunction: Secondary | ICD-10-CM

## 2023-11-06 LAB — COMPREHENSIVE METABOLIC PANEL
ALT: 17 [IU]/L (ref 0–44)
AST: 21 [IU]/L (ref 0–40)
Albumin: 3.9 g/dL (ref 3.9–4.9)
Alkaline Phosphatase: 50 [IU]/L (ref 44–121)
BUN/Creatinine Ratio: 15 (ref 10–24)
BUN: 18 mg/dL (ref 8–27)
Bilirubin Total: 0.7 mg/dL (ref 0.0–1.2)
CO2: 25 mmol/L (ref 20–29)
Calcium: 9.1 mg/dL (ref 8.6–10.2)
Chloride: 103 mmol/L (ref 96–106)
Creatinine, Ser: 1.18 mg/dL (ref 0.76–1.27)
Globulin, Total: 2.9 g/dL (ref 1.5–4.5)
Glucose: 93 mg/dL (ref 70–99)
Potassium: 4.9 mmol/L (ref 3.5–5.2)
Sodium: 142 mmol/L (ref 134–144)
Total Protein: 6.8 g/dL (ref 6.0–8.5)
eGFR: 67 mL/min/{1.73_m2} (ref >59)

## 2023-11-06 LAB — CBC
Hematocrit: 47.6 % (ref 37.5–51.0)
Hemoglobin: 15.8 g/dL (ref 13.0–17.7)
MCH: 30.7 pg (ref 26.6–33.0)
MCHC: 33.2 g/dL (ref 31.5–35.7)
MCV: 92 fL (ref 79–97)
Platelets: 158 10*3/uL (ref 150–450)
RBC: 5.15 x10E6/uL (ref 4.14–5.80)
RDW: 13 % (ref 11.6–15.4)
WBC: 3.9 10*3/uL (ref 3.4–10.8)

## 2023-11-06 LAB — PSA: Prostate Specific Ag, Serum: 3.2 ng/mL (ref 0.0–4.0)

## 2023-11-08 LAB — TESTOSTERONE,FREE AND TOTAL
Testosterone, Free: 19 pg/mL — ABNORMAL HIGH (ref 6.6–18.1)
Testosterone: 353 ng/dL (ref 264–916)

## 2023-11-11 ENCOUNTER — Ambulatory Visit: Payer: Medicare PPO | Admitting: Urology

## 2023-11-11 VITALS — BP 142/75 | HR 69

## 2023-11-11 DIAGNOSIS — E291 Testicular hypofunction: Secondary | ICD-10-CM | POA: Diagnosis not present

## 2023-11-11 DIAGNOSIS — N138 Other obstructive and reflux uropathy: Secondary | ICD-10-CM | POA: Diagnosis not present

## 2023-11-11 DIAGNOSIS — R351 Nocturia: Secondary | ICD-10-CM

## 2023-11-11 DIAGNOSIS — N5201 Erectile dysfunction due to arterial insufficiency: Secondary | ICD-10-CM

## 2023-11-11 DIAGNOSIS — N401 Enlarged prostate with lower urinary tract symptoms: Secondary | ICD-10-CM

## 2023-11-11 MED ORDER — SILODOSIN 8 MG PO CAPS: 8.0000 mg | ORAL_CAPSULE | Freq: Two times a day (BID) | ORAL | 3 refills | Status: DC

## 2023-11-11 MED ORDER — FINASTERIDE 5 MG PO TABS: 5.0000 mg | ORAL_TABLET | Freq: Every day | ORAL | 3 refills | Status: DC

## 2023-11-11 MED ORDER — TADALAFIL 20 MG PO TABS: ORAL_TABLET | ORAL | 3 refills | Status: DC

## 2023-11-11 MED ORDER — JATENZO 237 MG PO CAPS: 1.0000 | ORAL_CAPSULE | Freq: Two times a day (BID) | ORAL | 3 refills | Status: DC

## 2023-11-11 NOTE — Progress Notes (Unsigned)
11/11/2023 10:26 AM   Barbette Merino 14-Sep-1956 [id number]  Referring provider: Eden Emms, NP [address] [place],  [place] [address]  Followup hypogonadism and BPH   HPI: Mr Kinas is a 67yo here for followup for BPH and hypogonadism. IPSS 5 QOl 1 on rapaflo 8mg  BID. Urine stream is strong. Nocturia 0-2x. No straining to urinate. Testosterone improved to 350 on jatenzo 237mg  BID. Energy has improved. Libido has improved.   PMH: Past Medical History:  Diagnosis Date   Atrial flutter (HCC)    Colon polyps    Depression    DVT (deep vein thrombosis) in pregnancy    last one in March 2019 taking eliquis   Genital herpes    Heart murmur    Hemorrhoids    Hyperlipidemia    Hypogonadism in male    Neuromuscular disorder (HCC)    slight numbnes in right foot    Surgical History: Past Surgical History:  Procedure Laterality Date   A-FLUTTER ABLATION N/A 03/22/2017   Procedure: A-Flutter Ablation;  Surgeon: Duke Salvia, MD;  [facility];  Service: Cardiovascular;  Laterality: N/A;   COLONOSCOPY WITH PROPOFOL N/A 05/16/2018   Procedure: COLONOSCOPY WITH PROPOFOL;  Surgeon: Midge Minium, MD;  Location: [facility];  Service: Endoscopy;  Laterality: N/A;   COLONOSCOPY WITH PROPOFOL N/A 05/28/2023   Procedure: COLONOSCOPY WITH PROPOFOL;  Surgeon: Midge Minium, MD;  Location: [facility] ENDOSCOPY;  Service: Endoscopy;  Laterality: N/A;   KNEE ARTHROSCOPY     POLYPECTOMY  05/16/2018   Procedure: POLYPECTOMY;  Surgeon: Midge Minium, MD;  Location: [facility];  Service: Endoscopy;;   TENOTOMY ACHILLES TENDON Right 08/22/2018   VASECTOMY  1990    Home Medications:  Allergies as of 11/11/2023   No Known Allergies      Medication List        Accurate as of November 11, 2023 10:26 AM. If you have any questions, ask your nurse or doctor.          STOP taking these medications    Celery Seed Oil       TAKE these medications     Eliquis 5 MG Tabs tablet Generic drug: apixaban TAKE 1 TABLET BY MOUTH TWICE A DAY   ezetimibe 10 MG tablet Commonly known as: ZETIA TAKE 1 TABLET BY MOUTH EVERY DAY   finasteride 5 MG tablet Commonly known as: PROSCAR Take 1 tablet (5 mg total) by mouth daily.   Fish Oil 1200 MG Caps Take 2,400-3,600 capsules by mouth daily. Take 2,400 mg by mouth in the morning and 3,600 mg in the evening   glucosamine-chondroitin 500-400 MG tablet Take 1 tablet by mouth 2 (two) times daily.   Jatenzo 237 MG Caps Generic drug: Testosterone Undecanoate Take 1 capsule (237 mg total) by mouth in the morning and at bedtime.   Magnesium 300 MG Caps Take 300 mg by mouth at bedtime.   silodosin 8 MG Caps capsule Commonly known as: RAPAFLO TAKE 1 CAPSULE (8 MG TOTAL) BY MOUTH IN THE MORNING AND AT BEDTIME.   tadalafil 20 MG tablet Commonly known as: CIALIS TAKE ONE TABLET BY MOUTH DAILY AS NEEDED FOR ERECTILE DYSFUNCTION   valACYclovir 1000 MG tablet Commonly known as: VALTREX TAKE 1/2 TABLET BY MOUTH DAILY   vitamin D3 50 MCG (2000 UT) Caps Take 6,000 Units by mouth.   vitamin E 180 MG (400 UNITS) capsule Take 400 Units by mouth daily.  Allergies: No Known Allergies  Family History: Family History  Problem Relation Age of Onset   Breast cancer Mother    Heart disease Mother    Hyperlipidemia Mother    Hypertension Mother    Colon cancer Father    Heart disease Father    Alzheimer's disease Father    Hyperlipidemia Father    Multiple sclerosis Sister        primary progessive MS   Hyperlipidemia Brother    Cancer Brother 100       Pancreatic 2020    Social History:  reports that he has never smoked. He has never used smokeless tobacco. He reports that he does not currently use alcohol after a past usage of about 7.0 standard drinks of alcohol per week. He reports that he does not use drugs.  ROS: All other review of systems were reviewed and are negative except  what is noted above in HPI  Physical Exam: BP (!) 142/75   Pulse 69   Constitutional:  Alert and oriented, No acute distress. HEENT: [place] AT, moist mucus membranes.  Trachea midline, no masses. Cardiovascular: No clubbing, cyanosis, or edema. Respiratory: Normal respiratory effort, no increased work of breathing. GI: Abdomen is soft, nontender, nondistended, no abdominal masses GU: No CVA tenderness.  Lymph: No cervical or inguinal lymphadenopathy. Skin: No rashes, bruises or suspicious lesions. Neurologic: Grossly intact, no focal deficits, moving all 4 extremities. Psychiatric: Normal mood and affect.  Laboratory Data: Lab Results  Component Value Date   WBC 3.9 11/05/2023   HGB 15.8 11/05/2023   HCT 47.6 11/05/2023   MCV 92 11/05/2023   PLT 158 11/05/2023    Lab Results  Component Value Date   CREATININE 1.18 11/05/2023    Lab Results  Component Value Date   PSA 3.61 10/21/2017   PSA 3.47 06/29/2016   PSA 2.34 04/18/2015    Lab Results  Component Value Date   TESTOSTERONE 353 11/05/2023    Lab Results  Component Value Date   HGBA1C 5.5 09/15/2019    Urinalysis    Component Value Date/Time   APPEARANCEUR Clear 08/09/2023 1207   GLUCOSEU Negative 08/09/2023 1207   BILIRUBINUR Negative 08/09/2023 1207   KETONESUR negative 05/29/2022 1346   PROTEINUR Negative 08/09/2023 1207   UROBILINOGEN 0.2 05/29/2022 1346   NITRITE Negative 08/09/2023 1207   LEUKOCYTESUR Negative 08/09/2023 1207    Lab Results  Component Value Date   LABMICR Comment 08/09/2023    Pertinent Imaging:  No results found for this or any previous visit.  No results found for this or any previous visit.  No results found for this or any previous visit.  No results found for this or any previous visit.  No results found for this or any previous visit.  No valid procedures specified. No results found for this or any previous visit.  No results found for this or any previous  visit.   Assessment & Plan:    1. Male hypogonadism Continue jatenzo 237mg  BID. Followup 3 months with testosterone labs  2. Nocturia Continue rapalfo 8mg  BID  3. Benign prostatic hyperplasia with urinary obstruction Continue rapaflo 8mg  BID and finasteride 5mg  daily  4. Erectile dysfunction due to arterial insufficiency Tadalafil 20mg  prn   No follow-ups on file.  Wilkie Aye, MD  [facility] [place]

## 2023-11-12 ENCOUNTER — Encounter: Payer: Self-pay | Admitting: Urology

## 2023-11-12 NOTE — Patient Instructions (Signed)

## 2023-12-31 ENCOUNTER — Other Ambulatory Visit: Payer: Self-pay | Admitting: Nurse Practitioner

## 2023-12-31 DIAGNOSIS — E781 Pure hyperglyceridemia: Secondary | ICD-10-CM

## 2024-02-04 ENCOUNTER — Telehealth: Payer: Medicare PPO | Admitting: Physician Assistant

## 2024-02-04 DIAGNOSIS — M25511 Pain in right shoulder: Secondary | ICD-10-CM

## 2024-02-04 NOTE — Patient Instructions (Signed)
  Barbette Merino, thank you for joining [name], [name] for today's virtual visit.  While this provider is not your primary care provider (PCP), if your PCP is located in our provider database this encounter information will be shared with them immediately following your visit.   A [facility] MyChart account gives you access to today's visit and all your visits, tests, and labs performed at [facility] " click here if you don't have a [facility] MyChart account or go to [email or web address]  Consent: (Patient) Curtis Cain provided verbal consent for this virtual visit at the beginning of the encounter.  Current Medications:  Current Outpatient Medications:    Cholecalciferol (VITAMIN D3) 50 MCG (2000 UT) CAPS, Take 6,000 Units by mouth., Disp: , Rfl:    ELIQUIS 5 MG TABS tablet, TAKE 1 TABLET BY MOUTH TWICE A DAY, Disp: 180 tablet, Rfl: 1   ezetimibe (ZETIA) 10 MG tablet, TAKE 1 TABLET BY MOUTH EVERY DAY, Disp: 90 tablet, Rfl: 1   finasteride (PROSCAR) 5 MG tablet, Take 1 tablet (5 mg total) by mouth daily., Disp: 90 tablet, Rfl: 3   Magnesium 300 MG CAPS, Take 300 mg by mouth at bedtime. , Disp: , Rfl:    Omega-3 Fatty Acids (FISH OIL) 1200 MG CAPS, Take 2,400-3,600 capsules by mouth daily. Take 2,400 mg by mouth in the morning and 3,600 mg in the evening, Disp: , Rfl:    silodosin (RAPAFLO) 8 MG CAPS capsule, Take 1 capsule (8 mg total) by mouth in the morning and at bedtime., Disp: 180 capsule, Rfl: 3   tadalafil (CIALIS) 20 MG tablet, TAKE ONE TABLET BY MOUTH DAILY AS NEEDED FOR ERECTILE DYSFUNCTION, Disp: 30 tablet, Rfl: 3   Testosterone Undecanoate (JATENZO) 237 MG CAPS, Take 1 capsule (237 mg total) by mouth in the morning and at bedtime., Disp: 60 capsule, Rfl: 3   valACYclovir (VALTREX) 1000 MG tablet, TAKE 1/2 TABLET BY MOUTH DAILY, Disp: 45 tablet, Rfl: 1   vitamin E 180 MG (400 UNITS) capsule, Take 400 Units by mouth daily., Disp: , Rfl:    Medications  ordered in this encounter:  No orders of the defined types were placed in this encounter.    *If you need refills on other medications prior to your next appointment, please contact your pharmacy*  Follow-Up: Call back or seek an in-person evaluation if the symptoms worsen or if the condition fails to improve as anticipated.  [facility] Virtual Care [phone]  Other Instructions  [facility] [address] [place], [place] [address] CONTACT Phone: [phone] Fax: [phone] URGENT CARE HOURS Monday-Sunday: 9:00AM - 9:00PM  HOLIDAY HOURS Holidays: 8:30AM - 4:30PM    If you have been instructed to have an in-person evaluation today at a local Urgent Care facility, please use the link below. It will take you to a list of all of our available [facility], including address, phone number and hours of operation. Please do not delay care.  [facility]  If you or a family member do not have a primary care provider, use the link below to schedule a visit and establish care. When you choose a [facility] primary care physician or advanced practice provider, you gain a long-term partner in health. Find a Primary Care Provider  Learn more about [facility]'s in-office and virtual care options:  - Get Care Now

## 2024-02-04 NOTE — Progress Notes (Signed)
 Virtual Visit Consent   Harold Schwartz, you are scheduled for a virtual visit with a [facility] provider today. Just as with appointments in the office, your consent must be obtained to participate. Your consent will be active for this visit and any virtual visit you may have with one of our providers in the next 365 days. If you have a MyChart account, a copy of this consent can be sent to you electronically.  As this is a virtual visit, video technology does not allow for your provider to perform a traditional examination. This may limit your provider's ability to fully assess your condition. If your provider identifies any concerns that need to be evaluated in person or the need to arrange testing (such as labs, EKG, etc.), we will make arrangements to do so. Although advances in technology are sophisticated, we cannot ensure that it will always work on either your end or our end. If the connection with a video visit is poor, the visit may have to be switched to a telephone visit. With either a video or telephone visit, we are not always able to ensure that we have a secure connection.  By engaging in this virtual visit, you consent to the provision of healthcare and authorize for your insurance to be billed (if applicable) for the services provided during this visit. Depending on your insurance coverage, you may receive a charge related to this service.  I need to obtain your verbal consent now. Are you willing to proceed with your visit today? Harold Schwartz has provided verbal consent on 02/04/2024 for a virtual visit (video or telephone). [name], [name]  Date: 02/04/2024 8:09 AM   Virtual Visit via Video Note   I, [name], connected with  Harold Schwartz  ([id number], [birth year]) on 02/04/24 at  8:00 AM EST by a video-enabled telemedicine application and verified that I am speaking with the correct person using two identifiers.  Location: Patient: Virtual Visit Location  Patient: Home Provider: Virtual Visit Location Provider: Home Office   I discussed the limitations of evaluation and management by telemedicine and the availability of in person appointments. The patient expressed understanding and agreed to proceed.    History of Present Illness: Harold Schwartz is a 68 y.o. who identifies as a male who was assigned male at birth, and is being seen today for right shoulder pain.  HPI: Shoulder Pain  The pain is present in the right shoulder. This is a new problem. The current episode started more than 1 month ago (Had bought a new machine for leaf mulching and it is hard to crank (pull crank); Pain has been in shoulder for months and ROM has been decreasing). History of extremity trauma: possibly. The problem occurs constantly. The problem has been gradually worsening. The quality of the pain is described as aching and dull. The pain is mild. Associated symptoms include a limited range of motion (ER, ABD, forward flexion all limited) and stiffness. Pertinent negatives include no joint locking, joint swelling, numbness or tingling. The symptoms are aggravated by activity and cold. He has tried chondroitin, NSAIDS and acetaminophen for the symptoms. The treatment provided no relief.     Problems:  Patient Active Problem List   Diagnosis Date Noted   History of colonic polyps 05/28/2023   Polyp of sigmoid colon 05/28/2023   Skin lesion 12/21/2022   Seborrheic keratosis 12/21/2022   Palpitations 03/22/2022   Decreased GFR 10/27/2021   Thrombocytopenia (HCC) 10/27/2021   Preventative health  care 10/27/2021   Testicular mass 10/03/2021   Nocturia 08/17/2020   Erectile dysfunction due to arterial insufficiency 08/17/2020   Benign prostatic hyperplasia with urinary obstruction 03/23/2019   Chronic deep vein thrombosis (DVT) of proximal vein of left lower extremity (HCC) 11/21/2016   Genital herpes 04/25/2015   Male hypogonadism 04/22/2014   Hypertriglyceridemia  04/22/2014   Paroxysmal atrial flutter (HCC) 04/22/2014    Allergies: No Known Allergies Medications:  Current Outpatient Medications:    Cholecalciferol (VITAMIN D3) 50 MCG (2000 UT) CAPS, Take 6,000 Units by mouth., Disp: , Rfl:    ELIQUIS 5 MG TABS tablet, TAKE 1 TABLET BY MOUTH TWICE A DAY, Disp: 180 tablet, Rfl: 1   ezetimibe (ZETIA) 10 MG tablet, TAKE 1 TABLET BY MOUTH EVERY DAY, Disp: 90 tablet, Rfl: 1   finasteride (PROSCAR) 5 MG tablet, Take 1 tablet (5 mg total) by mouth daily., Disp: 90 tablet, Rfl: 3   Magnesium 300 MG CAPS, Take 300 mg by mouth at bedtime. , Disp: , Rfl:    Omega-3 Fatty Acids (FISH OIL) 1200 MG CAPS, Take 2,400-3,600 capsules by mouth daily. Take 2,400 mg by mouth in the morning and 3,600 mg in the evening, Disp: , Rfl:    silodosin (RAPAFLO) 8 MG CAPS capsule, Take 1 capsule (8 mg total) by mouth in the morning and at bedtime., Disp: 180 capsule, Rfl: 3   tadalafil (CIALIS) 20 MG tablet, TAKE ONE TABLET BY MOUTH DAILY AS NEEDED FOR ERECTILE DYSFUNCTION, Disp: 30 tablet, Rfl: 3   Testosterone Undecanoate (JATENZO) 237 MG CAPS, Take 1 capsule (237 mg total) by mouth in the morning and at bedtime., Disp: 60 capsule, Rfl: 3   valACYclovir (VALTREX) 1000 MG tablet, TAKE 1/2 TABLET BY MOUTH DAILY, Disp: 45 tablet, Rfl: 1   vitamin E 180 MG (400 UNITS) capsule, Take 400 Units by mouth daily., Disp: , Rfl:   Observations/Objective: Patient is well-developed, well-nourished in no acute distress.  Resting comfortably at home.  Head is normocephalic, atraumatic.  No labored breathing.  Speech is clear and coherent with logical content.  Patient is alert and oriented at baseline.    Assessment and Plan: 1. Acute pain of right shoulder (Primary)  - Patient desiring PT referral - Discussed limitations with Virtual Urgent Care and that we are unable to place referrals - Discussed options and patient will go to [facility] in [place], info provided in  AVS  Follow Up Instructions: I discussed the assessment and treatment plan with the patient. The patient was provided an opportunity to ask questions and all were answered. The patient agreed with the plan and demonstrated an understanding of the instructions.  A copy of instructions were sent to the patient via MyChart unless otherwise noted below.    The patient was advised to call back or seek an in-person evaluation if the symptoms worsen or if the condition fails to improve as anticipated.    [name], [name]

## 2024-02-06 ENCOUNTER — Other Ambulatory Visit: Payer: Self-pay | Admitting: Nurse Practitioner

## 2024-02-06 DIAGNOSIS — I825Y2 Chronic embolism and thrombosis of unspecified deep veins of left proximal lower extremity: Secondary | ICD-10-CM

## 2024-02-07 NOTE — Telephone Encounter (Signed)
 Patient will be due for CPE in May don't see where appointment is scheduled

## 2024-02-08 ENCOUNTER — Encounter: Payer: Self-pay | Admitting: Nurse Practitioner

## 2024-02-10 ENCOUNTER — Telehealth: Payer: Self-pay

## 2024-02-10 ENCOUNTER — Other Ambulatory Visit: Payer: Self-pay

## 2024-02-10 DIAGNOSIS — E291 Testicular hypofunction: Secondary | ICD-10-CM

## 2024-02-10 NOTE — Telephone Encounter (Signed)
 Pt called requesting labs be sent to [facility] in [place] [facility] faxed to Fax: [phone] Pt was advised he needs his labs taken before 10am and Pt states he will set a new appointment tomorrow (02/25)

## 2024-02-11 DIAGNOSIS — E291 Testicular hypofunction: Secondary | ICD-10-CM | POA: Diagnosis not present

## 2024-02-12 DIAGNOSIS — M7541 Impingement syndrome of right shoulder: Secondary | ICD-10-CM | POA: Diagnosis not present

## 2024-02-12 LAB — CBC
Hematocrit: 46.8 % (ref 37.5–51.0)
Hemoglobin: 16 g/dL (ref 13.0–17.7)
MCH: 31.3 pg (ref 26.6–33.0)
MCHC: 34.2 g/dL (ref 31.5–35.7)
MCV: 92 fL (ref 79–97)
Platelets: 151 10*3/uL (ref 150–450)
RBC: 5.11 x10E6/uL (ref 4.14–5.80)
RDW: 12.9 % (ref 11.6–15.4)
WBC: 5.3 10*3/uL (ref 3.4–10.8)

## 2024-02-12 LAB — TESTOSTERONE: Testosterone: 392 ng/dL (ref 264–916)

## 2024-02-14 ENCOUNTER — Other Ambulatory Visit: Payer: Self-pay

## 2024-02-14 ENCOUNTER — Ambulatory Visit: Payer: Medicare PPO | Admitting: Urology

## 2024-02-14 VITALS — BP 131/84 | HR 71

## 2024-02-14 DIAGNOSIS — E291 Testicular hypofunction: Secondary | ICD-10-CM | POA: Diagnosis not present

## 2024-02-14 DIAGNOSIS — N138 Other obstructive and reflux uropathy: Secondary | ICD-10-CM | POA: Diagnosis not present

## 2024-02-14 DIAGNOSIS — N5201 Erectile dysfunction due to arterial insufficiency: Secondary | ICD-10-CM

## 2024-02-14 DIAGNOSIS — R351 Nocturia: Secondary | ICD-10-CM | POA: Diagnosis not present

## 2024-02-14 DIAGNOSIS — N401 Enlarged prostate with lower urinary tract symptoms: Secondary | ICD-10-CM

## 2024-02-14 MED ORDER — FINASTERIDE 5 MG PO TABS: 5.0000 mg | ORAL_TABLET | Freq: Every day | ORAL | 3 refills | Status: DC

## 2024-02-14 MED ORDER — SILODOSIN 8 MG PO CAPS: 8.0000 mg | ORAL_CAPSULE | Freq: Two times a day (BID) | ORAL | 3 refills | Status: DC

## 2024-02-14 MED ORDER — JATENZO 237 MG PO CAPS: 1.0000 | ORAL_CAPSULE | Freq: Three times a day (TID) | ORAL | 3 refills | Status: DC

## 2024-02-14 NOTE — Progress Notes (Signed)
 02/14/2024 10:30 AM   Harold Schwartz 04-19-1956 [id number]  Referring provider: Eden Emms, NP [address] [place],  [place] [address]     HPI: Harold Schwartz is a 18JY here for followup for hypogonadism, BPH with nocturia. Testosterone 392. Hemoglobin 16.0. He has decreased libido and energy. IPSS 7 QOL 2 on finasteride 5mg  and rapaflo 8mg  BID. Urine stream strong. Nocturia 1-2x. No straining to urinate. No other complaints today   PMH: Past Medical History:  Diagnosis Date   Atrial flutter (HCC)    Colon polyps    Depression    DVT (deep vein thrombosis) in pregnancy    last one in March 2019 taking eliquis   Genital herpes    Heart murmur    Hemorrhoids    Hyperlipidemia    Hypogonadism in male    Neuromuscular disorder (HCC)    slight numbnes in right foot    Surgical History: Past Surgical History:  Procedure Laterality Date   A-FLUTTER ABLATION N/A 03/22/2017   Procedure: A-Flutter Ablation;  Surgeon: Duke Salvia, MD;  [facility];  Service: Cardiovascular;  Laterality: N/A;   COLONOSCOPY WITH PROPOFOL N/A 05/16/2018   Procedure: COLONOSCOPY WITH PROPOFOL;  Surgeon: Midge Minium, MD;  Location: [facility];  Service: Endoscopy;  Laterality: N/A;   COLONOSCOPY WITH PROPOFOL N/A 05/28/2023   Procedure: COLONOSCOPY WITH PROPOFOL;  Surgeon: Midge Minium, MD;  Location: [facility] END[facility];  Service: Endoscopy;  Laterality: N/A;   KNEE ARTHROSCOPY     POLYPECTOMY  05/16/2018   Procedure: POLYPECTOMY;  Surgeon: Midge Minium, MD;  Location: [facility];  Service: Endoscopy;;   TENOTOMY ACHILLES TENDON Right 08/22/2018   VASECTOMY  1990    Home Medications:  Allergies as of 02/14/2024   No Known Allergies      Medication List        Accurate as of February 14, 2024 10:30 AM. If you have any questions, ask your nurse or doctor.          Eliquis 5 MG Tabs tablet Generic drug: apixaban TAKE 1 TABLET BY MOUTH TWICE A DAY    ezetimibe 10 MG tablet Commonly known as: ZETIA TAKE 1 TABLET BY MOUTH EVERY DAY   finasteride 5 MG tablet Commonly known as: PROSCAR Take 1 tablet (5 mg total) by mouth daily.   Fish Oil 1200 MG Caps Take 2,400-3,600 capsules by mouth daily. Take 2,400 mg by mouth in the morning and 3,600 mg in the evening   Jatenzo 237 MG Caps Generic drug: Testosterone Undecanoate Take 1 capsule (237 mg total) by mouth in the morning and at bedtime.   Magnesium 300 MG Caps Take 300 mg by mouth at bedtime.   silodosin 8 MG Caps capsule Commonly known as: RAPAFLO Take 1 capsule (8 mg total) by mouth in the morning and at bedtime.   tadalafil 20 MG tablet Commonly known as: CIALIS TAKE ONE TABLET BY MOUTH DAILY AS NEEDED FOR ERECTILE DYSFUNCTION   valACYclovir 1000 MG tablet Commonly known as: VALTREX TAKE 1/2 TABLET BY MOUTH DAILY   vitamin D3 50 MCG (2000 UT) Caps Take 6,000 Units by mouth.   vitamin E 180 MG (400 UNITS) capsule Take 400 Units by mouth daily.        Allergies: No Known Allergies  Family History: Family History  Problem Relation Age of Onset   Breast cancer Mother    Heart disease Mother    Hyperlipidemia Mother  Hypertension Mother    Colon cancer Father    Heart disease Father    Alzheimer's disease Father    Hyperlipidemia Father    Multiple sclerosis Sister        primary progessive MS   Hyperlipidemia Brother    Cancer Brother 86       Pancreatic 2020    Social History:  reports that he has never smoked. He has never used smokeless tobacco. He reports that he does not currently use alcohol after a past usage of about 7.0 standard drinks of alcohol per week. He reports that he does not use drugs.  ROS: All other review of systems were reviewed and are negative except what is noted above in HPI  Physical Exam: BP 131/84   Pulse 71   Constitutional:  Alert and oriented, No acute distress. HEENT: [place] AT, moist mucus membranes.  Trachea  midline, no masses. Cardiovascular: No clubbing, cyanosis, or edema. Respiratory: Normal respiratory effort, no increased work of breathing. GI: Abdomen is soft, nontender, nondistended, no abdominal masses GU: No CVA tenderness.  Lymph: No cervical or inguinal lymphadenopathy. Skin: No rashes, bruises or suspicious lesions. Neurologic: Grossly intact, no focal deficits, moving all 4 extremities. Psychiatric: Normal mood and affect.  Laboratory Data: Lab Results  Component Value Date   WBC 5.3 02/11/2024   HGB 16.0 02/11/2024   HCT 46.8 02/11/2024   MCV 92 02/11/2024   PLT 151 02/11/2024    Lab Results  Component Value Date   CREATININE 1.18 11/05/2023    Lab Results  Component Value Date   PSA 3.61 10/21/2017   PSA 3.47 06/29/2016   PSA 2.34 04/18/2015    Lab Results  Component Value Date   TESTOSTERONE 392 02/11/2024    Lab Results  Component Value Date   HGBA1C 5.5 09/15/2019    Urinalysis    Component Value Date/Time   APPEARANCEUR Clear 08/09/2023 1207   GLUCOSEU Negative 08/09/2023 1207   BILIRUBINUR Negative 08/09/2023 1207   KETONESUR negative 05/29/2022 1346   PROTEINUR Negative 08/09/2023 1207   UROBILINOGEN 0.2 05/29/2022 1346   NITRITE Negative 08/09/2023 1207   LEUKOCYTESUR Negative 08/09/2023 1207    Lab Results  Component Value Date   LABMICR Comment 08/09/2023    Pertinent Imaging:  No results found for this or any previous visit.  No results found for this or any previous visit.  No results found for this or any previous visit.  No results found for this or any previous visit.  No results found for this or any previous visit.  No results found for this or any previous visit.  No results found for this or any previous visit.  No results found for this or any previous visit.   Assessment & Plan:    1. Hypogonadism male (Primary) Increase jatenzo to 237mg  TID Followup 3 months with testosterone labs  2. Benign  prostatic hyperplasia with urinary obstruction Continue rapaflo 8mg  BID and finasteride 5mg   3. Nocturia Continue rapaflo 8mg  BID and finasteride 5mg  daily  4. Erectile dysfunction due to arterial insufficiency Tadalafil 20mg  prn   No follow-ups on file.  Wilkie Aye, MD  [facility] [place]

## 2024-02-18 DIAGNOSIS — M25511 Pain in right shoulder: Secondary | ICD-10-CM | POA: Diagnosis not present

## 2024-02-21 DIAGNOSIS — S43431A Superior glenoid labrum lesion of right shoulder, initial encounter: Secondary | ICD-10-CM | POA: Diagnosis not present

## 2024-02-21 DIAGNOSIS — M7541 Impingement syndrome of right shoulder: Secondary | ICD-10-CM | POA: Diagnosis not present

## 2024-02-23 ENCOUNTER — Encounter: Payer: Self-pay | Admitting: Urology

## 2024-02-23 NOTE — Patient Instructions (Signed)

## 2024-03-04 DIAGNOSIS — M25511 Pain in right shoulder: Secondary | ICD-10-CM | POA: Diagnosis not present

## 2024-03-09 ENCOUNTER — Encounter: Payer: Self-pay | Admitting: Nurse Practitioner

## 2024-03-09 ENCOUNTER — Ambulatory Visit: Admitting: Nurse Practitioner

## 2024-03-09 VITALS — BP 108/70 | HR 69 | Temp 97.7°F | Ht 71.0 in | Wt 206.8 lb

## 2024-03-09 DIAGNOSIS — R1313 Dysphagia, pharyngeal phase: Secondary | ICD-10-CM | POA: Insufficient documentation

## 2024-03-09 DIAGNOSIS — M25511 Pain in right shoulder: Secondary | ICD-10-CM | POA: Diagnosis not present

## 2024-03-09 MED ORDER — OMEPRAZOLE 20 MG PO CPDR: 20.0000 mg | DELAYED_RELEASE_CAPSULE | Freq: Every day | ORAL | 0 refills | Status: DC

## 2024-03-09 NOTE — Assessment & Plan Note (Addendum)
 Dysphagia presents periodically in the AM. Considering the likelihood of it being from a GERD origin. Other Ddx considered but not limited to, neurological dysfunction, nodular thyroid, and globus sensation. Will start a trial of omeprazole 20 mg daily for one month. Offered pt the option for a swallow study, but patient was amenable to the trial of medication first. If symptoms continue, then we will consider swallow study or GI consult.   I evaluated patient, was consulted regarding treatment, and agree with assessment and plan per Denice Bors RN, FNP Student   Audria Nine, DNP, AGNP-C

## 2024-03-09 NOTE — Progress Notes (Signed)
 Acute Office Visit  Subjective:     Patient ID: Harold Schwartz, male    DOB: 08-14-1956, 68 y.o.   MRN: [id number]  Chief Complaint  Patient presents with   Choking    Pt complains of having issues with swallowing. Pt states that this morning when trying to eat eggs he felt like coughing and complains that it feels like is not being swallowed completely. Pt states throat feels irritated. Symptoms started a few weeks ago. Mainly 1st in the morning.     HPI  Brett Canales is in today for dysphagia that started a month ago. He reports that in the beginning he would start having coughing fits after eating and that he has the feeling of being unable to swallow appropriately. Symptoms are only noticed in the mornings. The first week it happened every day, but now he reports it is only 20% of the time. He does not have any problems drinking water or thin liquids. He thinks it happens mostly when he eats an apple. After having choking episodes his throat feels irritated. Upon reflection he noted that for past couple of years he would occasionally wake up and clear his throat. Denies any voice changes, hoarseness, or ShOB. He does have occasionally have some palpitations that he relates to paroxsymal a-fib, but they never occur during his dysphagia episodes. He does not have any symptoms later in the day or in the evening.   Review of Systems  Constitutional:  Negative for chills, fever and weight loss.  Eyes:  Negative for blurred vision.  Respiratory:  Negative for shortness of breath.   Cardiovascular:  Positive for palpitations. Negative for chest pain.       Occasional episodes of palpitations pt relates to a-fib occurrence.   Gastrointestinal:  Negative for abdominal pain, nausea and vomiting.  Neurological:  Negative for dizziness, tingling, speech change, focal weakness and headaches.  Psychiatric/Behavioral:  Negative for depression and suicidal ideas.         Objective:    BP 108/70    Pulse 69   Temp 97.7 F (36.5 C) (Oral)   Ht 5\' 11"  (1.803 m)   Wt 93.8 kg   SpO2 96%   BMI 28.84 kg/m  BP Readings from Last 3 Encounters:  03/09/24 108/70  02/14/24 131/84  11/11/23 (!) 142/75   Wt Readings from Last 3 Encounters:  03/09/24 93.8 kg  06/18/23 93.4 kg  05/28/23 93.4 kg      Physical Exam Constitutional:      Appearance: Normal appearance. He is not ill-appearing.  HENT:     Mouth/Throat:     Mouth: Mucous membranes are dry.     Pharynx: Oropharynx is clear. Uvula midline. No pharyngeal swelling, oropharyngeal exudate or posterior oropharyngeal erythema.  Neck:     Thyroid: No thyroid mass, thyromegaly or thyroid tenderness.     Trachea: Trachea and phonation normal.  Cardiovascular:     Rate and Rhythm: Normal rate and regular rhythm.     Pulses: Normal pulses.     Heart sounds: Normal heart sounds. No murmur heard.    No friction rub. No gallop.  Pulmonary:     Effort: Pulmonary effort is normal.     Breath sounds: Normal breath sounds.  Abdominal:     General: Bowel sounds are normal.     Palpations: Abdomen is soft.  Musculoskeletal:     Cervical back: Neck supple.  Lymphadenopathy:     Cervical: No cervical adenopathy.  Neurological:  General: No focal deficit present.     Mental Status: He is alert.     Cranial Nerves: Cranial nerves 2-12 are intact.     Sensory: Sensation is intact.     Motor: Motor function is intact.     Deep Tendon Reflexes:     Reflex Scores:      Bicep reflexes are 2+ on the right side and 2+ on the left side.      Patellar reflexes are 2+ on the right side and 2+ on the left side.    No results found for any visits on 03/09/24.      Assessment & Plan:   Problem List Items Addressed This Visit     Pharyngeal dysphagia - Primary   Dysphagia presents periodically in the AM. Considering the likelihood of it being from a GERD origin. Other Ddx considered but not limited to, neurological dysfunction, nodular  thyroid, and globus sensation. Will start a trial of omeprazole 20 mg daily for one month. Offered pt the option for a swallow study, but patient was amenable to the trial of medication first. If symptoms continue, then we will consider swallow study or GI consult.       Relevant Medications   omeprazole (PRILOSEC) 20 MG capsule    Meds ordered this encounter  Medications   omeprazole (PRILOSEC) 20 MG capsule    Sig: Take 1 capsule (20 mg total) by mouth daily.    Dispense:  30 capsule    Refill:  0    Return in about 3 months (around 06/09/2024) for CPE.  Murvin Donning, RN

## 2024-03-09 NOTE — Progress Notes (Signed)
 Acute Office Visit  Subjective:     Patient ID: Harold Schwartz, male    DOB: Jul 08, 1956, 68 y.o.   MRN: [id number]  Chief Complaint  Patient presents with   Choking    Pt complains of having issues with swallowing. Pt states that this morning when trying to eat eggs he felt like coughing and complains that it feels like is not being swallowed completely. Pt states throat feels irritated. Symptoms started a few weeks ago. Mainly 1st in the morning.     HPI Patient is in today for swallowing concerns with a history of PAF, ED, DVT, hypogonadism, BPH.   States that he is having trouble swallowing over the past few weeks. States that it is worse in the morning. He also endorses his throat feeling irritated. Patient states that the episodes are happening 20% of the time. It happens only with solids and he has not had trouble with liquids. He does have some palpitaitons but he attributes that to his A fib and does not generally happen with choking episodes. He has not tried anything otc  Review of Systems  Constitutional:  Negative for chills and fever.  Respiratory:  Positive for cough. Negative for shortness of breath.   Cardiovascular:  Positive for palpitations. Negative for chest pain.  Gastrointestinal:  Negative for abdominal pain, diarrhea, nausea and vomiting.  Neurological:  Negative for headaches.        Objective:    BP 108/70   Pulse 69   Temp 97.7 F (36.5 C) (Oral)   Ht 5\' 11"  (1.803 m)   Wt 206 lb 12.8 oz (93.8 kg)   SpO2 96%   BMI 28.84 kg/m  BP Readings from Last 3 Encounters:  03/09/24 108/70  02/14/24 131/84  11/11/23 (!) 142/75   Wt Readings from Last 3 Encounters:  03/09/24 206 lb 12.8 oz (93.8 kg)  06/18/23 206 lb (93.4 kg)  05/28/23 206 lb (93.4 kg)   SpO2 Readings from Last 3 Encounters:  03/09/24 96%  05/28/23 100%  04/22/23 96%      Physical Exam Vitals and nursing note reviewed.  Constitutional:      Appearance: Normal appearance.   HENT:     Mouth/Throat:     Mouth: Mucous membranes are moist.     Pharynx: Oropharynx is clear.  Cardiovascular:     Rate and Rhythm: Normal rate and regular rhythm.     Heart sounds: Normal heart sounds.  Pulmonary:     Effort: Pulmonary effort is normal.     Breath sounds: Normal breath sounds.  Lymphadenopathy:     Cervical: No cervical adenopathy.  Neurological:     Mental Status: He is alert.     No results found for any visits on 03/09/24.      Assessment & Plan:   Problem List Items Addressed This Visit       Respiratory   Pharyngeal dysphagia - Primary   Dysphagia presents periodically in the AM. Considering the likelihood of it being from a GERD origin. Other Ddx considered but not limited to, neurological dysfunction, nodular thyroid, and globus sensation. Will start a trial of omeprazole 20 mg daily for one month. Offered pt the option for a swallow study, but patient was amenable to the trial of medication first. If symptoms continue, then we will consider swallow study or GI consult.   I evaluated patient, was consulted regarding treatment, and agree with assessment and plan per Denice Bors RN, FNP Student  Audria Nine, DNP, AGNP-C       Relevant Medications   omeprazole (PRILOSEC) 20 MG capsule    Meds ordered this encounter  Medications   omeprazole (PRILOSEC) 20 MG capsule    Sig: Take 1 capsule (20 mg total) by mouth daily.    Dispense:  30 capsule    Refill:  0    Return in about 3 months (around 06/09/2024) for CPE.  Audria Nine, NP

## 2024-03-09 NOTE — Patient Instructions (Addendum)
 It was a pleasure seeing you today.  We have sent a prescription to your pharmacy.  If this medication does not help your symptoms let us know.  You are due for you annual physical in May so follow up in 3 months.

## 2024-03-11 ENCOUNTER — Encounter: Payer: Self-pay | Admitting: Urology

## 2024-03-17 DIAGNOSIS — D2261 Melanocytic nevi of right upper limb, including shoulder: Secondary | ICD-10-CM | POA: Diagnosis not present

## 2024-03-17 DIAGNOSIS — D2239 Melanocytic nevi of other parts of face: Secondary | ICD-10-CM | POA: Diagnosis not present

## 2024-03-17 DIAGNOSIS — L821 Other seborrheic keratosis: Secondary | ICD-10-CM | POA: Diagnosis not present

## 2024-03-17 DIAGNOSIS — D2272 Melanocytic nevi of left lower limb, including hip: Secondary | ICD-10-CM | POA: Diagnosis not present

## 2024-03-17 DIAGNOSIS — D225 Melanocytic nevi of trunk: Secondary | ICD-10-CM | POA: Diagnosis not present

## 2024-03-17 DIAGNOSIS — D2271 Melanocytic nevi of right lower limb, including hip: Secondary | ICD-10-CM | POA: Diagnosis not present

## 2024-03-17 DIAGNOSIS — L570 Actinic keratosis: Secondary | ICD-10-CM | POA: Diagnosis not present

## 2024-03-17 DIAGNOSIS — D2262 Melanocytic nevi of left upper limb, including shoulder: Secondary | ICD-10-CM | POA: Diagnosis not present

## 2024-03-23 ENCOUNTER — Telehealth: Payer: Self-pay | Admitting: Nurse Practitioner

## 2024-03-23 NOTE — Telephone Encounter (Signed)
 Can we see if the omeprazole has helped with his swallowing

## 2024-03-23 NOTE — Telephone Encounter (Signed)
-----   Message from [facility] sent at 03/09/2024  9:13 AM EDT ----- Regarding: Swallowing Started omeprazole 0mg . See if this has helped with dysphagia. If not consider a swallow study

## 2024-03-24 NOTE — Telephone Encounter (Signed)
 Contacted pt. Pt states that omeprazole is helping and that he no longer has the swallowing problem. Complains of slight cough at night from his wife but has no questions or concerns.

## 2024-03-27 DIAGNOSIS — M25511 Pain in right shoulder: Secondary | ICD-10-CM | POA: Diagnosis not present

## 2024-04-01 DIAGNOSIS — M25511 Pain in right shoulder: Secondary | ICD-10-CM | POA: Diagnosis not present

## 2024-04-02 DIAGNOSIS — M25511 Pain in right shoulder: Secondary | ICD-10-CM | POA: Diagnosis not present

## 2024-04-05 ENCOUNTER — Other Ambulatory Visit: Payer: Self-pay | Admitting: Nurse Practitioner

## 2024-04-05 DIAGNOSIS — R1313 Dysphagia, pharyngeal phase: Secondary | ICD-10-CM

## 2024-04-10 DIAGNOSIS — M25511 Pain in right shoulder: Secondary | ICD-10-CM | POA: Diagnosis not present

## 2024-04-17 DIAGNOSIS — M25511 Pain in right shoulder: Secondary | ICD-10-CM | POA: Diagnosis not present

## 2024-04-28 DIAGNOSIS — M25611 Stiffness of right shoulder, not elsewhere classified: Secondary | ICD-10-CM | POA: Diagnosis not present

## 2024-04-28 DIAGNOSIS — M25511 Pain in right shoulder: Secondary | ICD-10-CM | POA: Diagnosis not present

## 2024-05-04 DIAGNOSIS — M25511 Pain in right shoulder: Secondary | ICD-10-CM | POA: Diagnosis not present

## 2024-05-04 DIAGNOSIS — M25611 Stiffness of right shoulder, not elsewhere classified: Secondary | ICD-10-CM | POA: Diagnosis not present

## 2024-05-06 DIAGNOSIS — M7501 Adhesive capsulitis of right shoulder: Secondary | ICD-10-CM | POA: Diagnosis not present

## 2024-05-12 ENCOUNTER — Other Ambulatory Visit: Payer: Self-pay | Admitting: Urology

## 2024-05-12 DIAGNOSIS — M25611 Stiffness of right shoulder, not elsewhere classified: Secondary | ICD-10-CM | POA: Diagnosis not present

## 2024-05-12 DIAGNOSIS — E291 Testicular hypofunction: Secondary | ICD-10-CM | POA: Diagnosis not present

## 2024-05-12 DIAGNOSIS — M25511 Pain in right shoulder: Secondary | ICD-10-CM | POA: Diagnosis not present

## 2024-05-14 ENCOUNTER — Other Ambulatory Visit: Payer: Medicare PPO

## 2024-05-14 LAB — COMPREHENSIVE METABOLIC PANEL WITH GFR
ALT: 23 IU/L (ref 0–44)
AST: 22 IU/L (ref 0–40)
Albumin: 4 g/dL (ref 3.9–4.9)
Alkaline Phosphatase: 43 IU/L — ABNORMAL LOW (ref 44–121)
BUN/Creatinine Ratio: 14 (ref 10–24)
BUN: 16 mg/dL (ref 8–27)
Bilirubin Total: 0.4 mg/dL (ref 0.0–1.2)
CO2: 23 mmol/L (ref 20–29)
Calcium: 9.3 mg/dL (ref 8.6–10.2)
Chloride: 105 mmol/L (ref 96–106)
Creatinine, Ser: 1.18 mg/dL (ref 0.76–1.27)
Globulin, Total: 2.3 g/dL (ref 1.5–4.5)
Glucose: 88 mg/dL (ref 70–99)
Potassium: 4.6 mmol/L (ref 3.5–5.2)
Sodium: 141 mmol/L (ref 134–144)
Total Protein: 6.3 g/dL (ref 6.0–8.5)
eGFR: 67 mL/min/{1.73_m2} (ref >59)

## 2024-05-14 LAB — CBC
Hematocrit: 50 % (ref 37.5–51.0)
Hemoglobin: 17.3 g/dL (ref 13.0–17.7)
MCH: 32.8 pg (ref 26.6–33.0)
MCHC: 34.6 g/dL (ref 31.5–35.7)
MCV: 95 fL (ref 79–97)
Platelets: 151 10*3/uL (ref 150–450)
RBC: 5.27 x10E6/uL (ref 4.14–5.80)
RDW: 13.2 % (ref 11.6–15.4)
WBC: 5 10*3/uL (ref 3.4–10.8)

## 2024-05-14 LAB — TESTOSTERONE,FREE AND TOTAL
Testosterone, Free: 41.5 pg/mL — ABNORMAL HIGH (ref 6.6–18.1)
Testosterone: 722 ng/dL (ref 264–916)

## 2024-05-15 ENCOUNTER — Ambulatory Visit: Payer: Medicare PPO | Admitting: Urology

## 2024-05-15 ENCOUNTER — Encounter: Payer: Self-pay | Admitting: Urology

## 2024-05-15 VITALS — BP 124/80 | HR 71

## 2024-05-15 DIAGNOSIS — R351 Nocturia: Secondary | ICD-10-CM

## 2024-05-15 DIAGNOSIS — N138 Other obstructive and reflux uropathy: Secondary | ICD-10-CM | POA: Diagnosis not present

## 2024-05-15 DIAGNOSIS — N401 Enlarged prostate with lower urinary tract symptoms: Secondary | ICD-10-CM | POA: Diagnosis not present

## 2024-05-15 DIAGNOSIS — E291 Testicular hypofunction: Secondary | ICD-10-CM

## 2024-05-15 MED ORDER — JATENZO 237 MG PO CAPS: 1.0000 | ORAL_CAPSULE | Freq: Three times a day (TID) | ORAL | 3 refills | Status: DC

## 2024-05-15 MED ORDER — SILODOSIN 8 MG PO CAPS: 8.0000 mg | ORAL_CAPSULE | Freq: Two times a day (BID) | ORAL | 3 refills | Status: DC

## 2024-05-15 MED ORDER — FINASTERIDE 5 MG PO TABS: 5.0000 mg | ORAL_TABLET | Freq: Every day | ORAL | 3 refills | Status: DC

## 2024-05-15 NOTE — Progress Notes (Signed)
 05/15/2024 10:09 AM   Harold Harold Schwartz February 27, 1956 [id number]  Referring provider: Dorothe Gaster, NP [address] [place],  [place] [address]  Followup hypogonadism and BPH   HPI: [name] is a 68yo here for followup for hypogonadism and BPH. Testosterone  722, Hemoglobin 17.3, CMP normal on jatenzo  237mg  TID. Energy ios good. Libido good. He uses tadalafil  20mg  prn with good results. IPSS 10 QOL 1 on rapaflo  8mg  BID and finasteride  5mg  daily. Urine stream strong. No straining to urinate. Ncoturia 1-2x.    PMH: Past Medical History:  Diagnosis Date   Atrial flutter (HCC)    Colon polyps    Depression    DVT (deep vein thrombosis) in pregnancy    last one in March 2019 taking eliquis    Genital herpes    Heart murmur    Hemorrhoids    Hyperlipidemia    Hypogonadism in male    Neuromuscular disorder (HCC)    slight numbnes in right foot    Surgical History: Past Surgical History:  Procedure Laterality Date   A-FLUTTER ABLATION N/A 03/22/2017   Procedure: A-Flutter Ablation;  Surgeon: Harold Goodwill, MD;  Location: [facility];  Service: Cardiovascular;  Laterality: N/A;   COLONOSCOPY WITH PROPOFOL  N/A 05/16/2018   Procedure: COLONOSCOPY WITH PROPOFOL ;  Surgeon: Harold Sink, MD;  Location: [facility];  Service: Endoscopy;  Laterality: N/A;   COLONOSCOPY WITH PROPOFOL  N/A 05/28/2023   Procedure: COLONOSCOPY WITH PROPOFOL ;  Surgeon: Harold Sink, MD;  Location: [facility]COPY;  Service: Endoscopy;  Laterality: N/A;   KNEE ARTHROSCOPY     POLYPECTOMY  05/16/2018   Procedure: POLYPECTOMY;  Surgeon: Harold Sink, MD;  Location: [facility];  Service: Endoscopy;;   TENOTOMY ACHILLES TENDON Right 08/22/2018   VASECTOMY  1990    Home Medications:  Allergies as of 05/15/2024   No Known Allergies      Medication List        Accurate as of May 15, 2024 10:09 AM. If you have any questions, ask your nurse or doctor.          Eliquis  5 MG Tabs  tablet Generic drug: apixaban  TAKE 1 TABLET BY MOUTH TWICE A DAY   ezetimibe  10 MG tablet Commonly known as: ZETIA  TAKE 1 TABLET BY MOUTH EVERY DAY   finasteride  5 MG tablet Commonly known as: PROSCAR  Take 1 tablet (5 mg total) by mouth daily.   Fish Oil 1200 MG Caps Take 2,400-3,600 capsules by mouth daily. Take 2,400 mg by mouth in the morning and 3,600 mg in the evening   Jatenzo  237 MG Caps Generic drug: Testosterone  Undecanoate Take 1 capsule (237 mg total) by mouth in the morning, at noon, and at bedtime.   Magnesium 300 MG Caps Take 300 mg by mouth at bedtime.   omeprazole  20 MG capsule Commonly known as: PRILOSEC TAKE 1 CAPSULE BY MOUTH EVERY DAY   silodosin  8 MG Caps capsule Commonly known as: RAPAFLO  Take 1 capsule (8 mg total) by mouth in the morning and at bedtime.   tadalafil  20 MG tablet Commonly known as: CIALIS  TAKE ONE TABLET BY MOUTH DAILY AS NEEDED FOR ERECTILE DYSFUNCTION   valACYclovir  1000 MG tablet Commonly known as: VALTREX  TAKE 1/2 TABLET BY MOUTH DAILY   vitamin D3 50 MCG (2000 UT) Caps Take 6,000 Units by mouth.   vitamin E 180 MG (400 UNITS) capsule Take 400 Units by mouth daily.        Allergies: No  Known Allergies  Family History: Family History  Problem Relation Age of Onset   Breast cancer Mother    Heart disease Mother    Hyperlipidemia Mother    Hypertension Mother    Colon cancer Father    Heart disease Father    Alzheimer's disease Father    Hyperlipidemia Father    Multiple sclerosis Sister        primary progessive MS   Hyperlipidemia Brother    Cancer Brother 10       Pancreatic 2020    Social History:  reports that he has never smoked. He has never used smokeless tobacco. He reports that he does not currently use alcohol after a past usage of about 7.0 standard drinks of alcohol per week. He reports that he does not use drugs.  ROS: All other review of systems were reviewed and are negative except what is  noted above in HPI  Physical Exam: BP 124/80   Pulse 71   Constitutional:  Alert and oriented, No acute distress. HEENT: Harold Schwartz AT, moist mucus membranes.  Trachea midline, no masses. Cardiovascular: No clubbing, cyanosis, or edema. Respiratory: Normal respiratory effort, no increased work of breathing. GI: Abdomen is soft, nontender, nondistended, no abdominal masses GU: No CVA tenderness.  Lymph: No cervical or inguinal lymphadenopathy. Skin: No rashes, bruises or suspicious lesions. Neurologic: Grossly intact, no focal deficits, moving all 4 extremities. Psychiatric: Normal Harold Schwartz and affect.  Laboratory Data: Lab Results  Component Value Date   WBC 5.0 05/12/2024   HGB 17.3 05/12/2024   HCT 50.0 05/12/2024   MCV 95 05/12/2024   PLT 151 05/12/2024    Lab Results  Component Value Date   CREATININE 1.18 05/12/2024    Lab Results  Component Value Date   PSA 3.61 10/21/2017   PSA 3.47 06/29/2016   PSA 2.34 04/18/2015    Lab Results  Component Value Date   TESTOSTERONE  722 05/12/2024    Lab Results  Component Value Date   HGBA1C 5.5 09/15/2019    Urinalysis    Component Value Date/Time   APPEARANCEUR Clear 08/09/2023 1207   GLUCOSEU Negative 08/09/2023 1207   BILIRUBINUR Negative 08/09/2023 1207   KETONESUR negative 05/29/2022 1346   PROTEINUR Negative 08/09/2023 1207   UROBILINOGEN 0.2 05/29/2022 1346   NITRITE Negative 08/09/2023 1207   LEUKOCYTESUR Negative 08/09/2023 1207    Lab Results  Component Value Date   LABMICR Comment 08/09/2023    Pertinent Imaging:  No results found for this or any previous visit.  No results found for this or any previous visit.  No results found for this or any previous visit.  No results found for this or any previous visit.  No results found for this or any previous visit.  No results found for this or any previous visit.  No results found for this or any previous visit.  No results found for this or any  previous visit.   Assessment & Plan:    1. Hypogonadism male (Primary) Continue jatenzo  237mg  TID -Followup 6 months with labs  2. Benign prostatic hyperplasia with urinary obstruction Continue rapalfo 8mg  BID and finasteride  5mg  daily  3. Nocturia Continue rapaflo  8mg  daily and finasteride  5mg  daily   No follow-ups on file.  Johnie Nailer, MD  [facility] [place]

## 2024-05-15 NOTE — Patient Instructions (Signed)

## 2024-05-27 DIAGNOSIS — M25511 Pain in right shoulder: Secondary | ICD-10-CM | POA: Diagnosis not present

## 2024-05-28 ENCOUNTER — Other Ambulatory Visit: Payer: Self-pay | Admitting: Nurse Practitioner

## 2024-05-28 DIAGNOSIS — A6000 Herpesviral infection of urogenital system, unspecified: Secondary | ICD-10-CM

## 2024-06-10 DIAGNOSIS — M25511 Pain in right shoulder: Secondary | ICD-10-CM | POA: Diagnosis not present

## 2024-06-18 ENCOUNTER — Encounter: Payer: Self-pay | Admitting: Nurse Practitioner

## 2024-06-18 ENCOUNTER — Ambulatory Visit: Payer: Medicare PPO

## 2024-06-18 ENCOUNTER — Ambulatory Visit: Admitting: Nurse Practitioner

## 2024-06-18 VITALS — BP 102/78 | HR 63 | Temp 97.9°F | Ht 71.0 in | Wt 210.6 lb

## 2024-06-18 VITALS — Ht 71.0 in | Wt 210.0 lb

## 2024-06-18 DIAGNOSIS — Z131 Encounter for screening for diabetes mellitus: Secondary | ICD-10-CM | POA: Diagnosis not present

## 2024-06-18 DIAGNOSIS — N138 Other obstructive and reflux uropathy: Secondary | ICD-10-CM

## 2024-06-18 DIAGNOSIS — Z0000 Encounter for general adult medical examination without abnormal findings: Secondary | ICD-10-CM

## 2024-06-18 DIAGNOSIS — N401 Enlarged prostate with lower urinary tract symptoms: Secondary | ICD-10-CM

## 2024-06-18 DIAGNOSIS — I825Y2 Chronic embolism and thrombosis of unspecified deep veins of left proximal lower extremity: Secondary | ICD-10-CM | POA: Diagnosis not present

## 2024-06-18 DIAGNOSIS — E781 Pure hyperglyceridemia: Secondary | ICD-10-CM | POA: Diagnosis not present

## 2024-06-18 DIAGNOSIS — I4892 Unspecified atrial flutter: Secondary | ICD-10-CM | POA: Diagnosis not present

## 2024-06-18 DIAGNOSIS — N5201 Erectile dysfunction due to arterial insufficiency: Secondary | ICD-10-CM | POA: Diagnosis not present

## 2024-06-18 DIAGNOSIS — M62838 Other muscle spasm: Secondary | ICD-10-CM

## 2024-06-18 DIAGNOSIS — Z125 Encounter for screening for malignant neoplasm of prostate: Secondary | ICD-10-CM

## 2024-06-18 DIAGNOSIS — A6000 Herpesviral infection of urogenital system, unspecified: Secondary | ICD-10-CM | POA: Diagnosis not present

## 2024-06-18 DIAGNOSIS — E291 Testicular hypofunction: Secondary | ICD-10-CM

## 2024-06-18 LAB — LIPID PANEL
Cholesterol: 172 mg/dL (ref 0–200)
HDL: 53 mg/dL (ref >39.00)
LDL Cholesterol: 109 mg/dL — ABNORMAL HIGH (ref 0–99)
NonHDL: 119.11
Total CHOL/HDL Ratio: 3
Triglycerides: 51 mg/dL (ref 0.0–149.0)
VLDL: 10.2 mg/dL (ref 0.0–40.0)

## 2024-06-18 LAB — CBC
HCT: 49.3 % (ref 39.0–52.0)
Hemoglobin: 16.5 g/dL (ref 13.0–17.0)
MCHC: 33.6 g/dL (ref 30.0–36.0)
MCV: 92.5 fl (ref 78.0–100.0)
Platelets: 145 10*3/uL — ABNORMAL LOW (ref 150.0–400.0)
RBC: 5.33 Mil/uL (ref 4.22–5.81)
RDW: 14 % (ref 11.5–15.5)
WBC: 4.4 10*3/uL (ref 4.0–10.5)

## 2024-06-18 LAB — COMPREHENSIVE METABOLIC PANEL WITH GFR
ALT: 17 U/L (ref 0–53)
AST: 17 U/L (ref 0–37)
Albumin: 4 g/dL (ref 3.5–5.2)
Alkaline Phosphatase: 33 U/L — ABNORMAL LOW (ref 39–117)
BUN: 14 mg/dL (ref 6–23)
CO2: 31 meq/L (ref 19–32)
Calcium: 8.7 mg/dL (ref 8.4–10.5)
Chloride: 105 meq/L (ref 96–112)
Creatinine, Ser: 1.12 mg/dL (ref 0.40–1.50)
GFR: 67.07 mL/min (ref >60.00)
Glucose, Bld: 89 mg/dL (ref 70–99)
Potassium: 4.4 meq/L (ref 3.5–5.1)
Sodium: 140 meq/L (ref 135–145)
Total Bilirubin: 1 mg/dL (ref 0.2–1.2)
Total Protein: 6.6 g/dL (ref 6.0–8.3)

## 2024-06-18 LAB — TSH: TSH: 2.26 u[IU]/mL (ref 0.35–5.50)

## 2024-06-18 LAB — HEMOGLOBIN A1C: Hgb A1c MFr Bld: 5.4 % (ref 4.6–6.5)

## 2024-06-18 LAB — PSA, MEDICARE: PSA: 3.55 ng/mL (ref 0.10–4.00)

## 2024-06-18 MED ORDER — CYCLOBENZAPRINE HCL 5 MG PO TABS: 5.0000 mg | ORAL_TABLET | Freq: Every evening | ORAL | 0 refills | Status: AC | PRN

## 2024-06-18 NOTE — Assessment & Plan Note (Signed)
 History of same currently maintained on valacyclovir  500 mg daily for suppression therapy continue

## 2024-06-18 NOTE — Patient Instructions (Signed)
 [name] , Thank you for taking time out of your busy schedule to complete your Annual Wellness Visit with me. I enjoyed our conversation and look forward to speaking with you again next year. I, as well as your care team,  appreciate your ongoing commitment to your health goals. Please review the following plan we discussed and let me know if I can assist you in the future. Your Game plan/ To Do List    Follow up Visits: Next Medicare AWV with our clinical staff: 06/22/25 @ 10:10am televisit   Have you seen your provider in the last 6 months (3 months if uncontrolled diabetes)? Yes Next Office Visit with your provider: not scheduled yet:due July 2026 for physical  Clinician Recommendations:  Aim for 30 minutes of exercise or brisk walking, 6-8 glasses of water , and 5 servings of fruits and vegetables each day.       This is a list of the screening recommended for you and due dates:  Health Maintenance  Topic Date Due   COVID-19 Vaccine (9 - Pfizer risk 2024-25 season) 02/20/2024   Flu Shot  07/17/2024   DTaP/Tdap/Td vaccine (2 - Td or Tdap) 04/24/2025   Medicare Annual Wellness Visit  06/18/2025   Colon Cancer Screening  05/27/2030   Pneumococcal Vaccine for age over 56  Completed   Hepatitis C Screening  Completed   Zoster (Shingles) Vaccine  Completed   Hepatitis B Vaccine  Aged Out   HPV Vaccine  Aged Out   Meningitis B Vaccine  Aged Out    Advanced directives: (In Chart) A copy of your advanced directives are scanned into your chart should your provider ever need it. Advance Care Planning is important because it:  [x]  Makes sure you receive the medical care that is consistent with your values, goals, and preferences  [x]  It provides guidance to your family and loved ones and reduces their decisional burden about whether or not they are making the right decisions based on your wishes.  Follow the link provided in your after visit summary or read over the paperwork we have mailed  to you to help you started getting your Advance Directives in place. If you need assistance in completing these, please reach out to us  so that we can help you!

## 2024-06-18 NOTE — Assessment & Plan Note (Signed)
 Patient currently followed by urology on oral testosterone  replacement.  Continue taking medication as prescribed follow-up with specialist as recommended

## 2024-06-18 NOTE — Progress Notes (Signed)
 Established Patient Office Visit  Subjective   Patient ID: Harold Schwartz, male    DOB: 1956/03/25  Age: 68 y.o. MRN: [id number]  Chief Complaint  Patient presents with   Annual Exam    HPI  A-fib: Patient currently maintained on Eliquis  5 mg twice daily  BPH: Currently maintained on finasteride  5 mg daily along with silodosin  8 mg daily he is followed by Dr. Belvie Clara urology.  GERD: Patient currently maintained on omeprazole  20 mg daily  HLD: Currently maintained on ezetimibe  10 mg daily  Hypogonadism: Patient currently maintained on testosterone  capsules also followed by urology  Herpes: Patient takes valacyclovir  500 mg daily for suppression therapy  for complete physical and follow up of chronic conditions.  Immunizations: -Tetanus: Completed in 2016 -Influenza: Out of season -Shingles: Completed Shingrix series -Pneumonia: Completed   Diet: Fair diet. He is eating 3 meals a day and snacks. He drinks water  Exercise:  He walks four miles a day also does stretching with strength training.  Eye exam: Completes annually. Wears glasses  Dental exam: Completes semi-annually    Colonoscopy: Completed in 05/28/2023, repeat 5 years patient will be due in 2029 Lung Cancer Screening: N/A  PSA: Due  Advanced directive: He has HPOA and living will   Sleep: goes to bed aorund 930 and gets up at 7. Feels rested most of the time. Intermittently snores    Spasms: states that he will have them nightly. Primarily the left side. If happens frequently. States that he has used felexeril in the past. He is taking magnesium and zinc. He is getting at least 64oz of water  a day       Review of Systems  Constitutional:  Negative for chills and fever.  Respiratory:  Negative for shortness of breath.   Cardiovascular:  Negative for chest pain and leg swelling.  Gastrointestinal:  Negative for abdominal pain, blood in stool, constipation, diarrhea, nausea and vomiting.        Bm daily   Genitourinary:  Negative for dysuria and hematuria.       Nocturia x1  Neurological:  Negative for tingling and headaches.  Psychiatric/Behavioral:  Negative for hallucinations and suicidal ideas.       Objective:     BP 102/78   Pulse 63   Temp 97.9 F (36.6 C) (Oral)   Ht 5' 11 (1.803 m)   Wt 210 lb 9.6 oz (95.5 kg)   SpO2 95%   BMI 29.37 kg/m  BP Readings from Last 3 Encounters:  06/18/24 102/78  05/15/24 124/80  03/09/24 108/70   Wt Readings from Last 3 Encounters:  06/18/24 210 lb 9.6 oz (95.5 kg)  03/09/24 206 lb 12.8 oz (93.8 kg)  06/18/23 206 lb (93.4 kg)   SpO2 Readings from Last 3 Encounters:  06/18/24 95%  03/09/24 96%  05/28/23 100%      Physical Exam Vitals and nursing note reviewed.  Constitutional:      Appearance: Normal appearance.  HENT:     Right Ear: Tympanic membrane, ear canal and external ear normal.     Left Ear: Tympanic membrane, ear canal and external ear normal.     Mouth/Throat:     Mouth: Mucous membranes are moist.     Pharynx: Oropharynx is clear.  Eyes:     Extraocular Movements: Extraocular movements intact.     Pupils: Pupils are equal, round, and reactive to light.  Cardiovascular:     Rate and Rhythm: Normal rate and regular rhythm.  Pulses: Normal pulses.     Heart sounds: Normal heart sounds.  Pulmonary:     Effort: Pulmonary effort is normal.     Breath sounds: Normal breath sounds.  Abdominal:     General: Bowel sounds are normal. There is no distension.     Palpations: There is no mass.     Tenderness: There is no abdominal tenderness.     Hernia: No hernia is present.     Comments: Rectus diastasis  Genitourinary:    Comments: Deferred Musculoskeletal:     Right lower leg: No edema.     Left lower leg: No edema.  Lymphadenopathy:     Cervical: No cervical adenopathy.  Skin:    General: Skin is warm.  Neurological:     General: No focal deficit present.     Mental Status: He is alert.      Deep Tendon Reflexes:     Reflex Scores:      Bicep reflexes are 2+ on the right side and 2+ on the left side.      Patellar reflexes are 2+ on the right side and 2+ on the left side.    Comments: Bilateral upper and lower extremity strength 5/5  Psychiatric:        Mood and Affect: Mood normal.        Behavior: Behavior normal.        Thought Content: Thought content normal.        Judgment: Judgment normal.      No results found for any visits on 06/18/24.    The 10-year ASCVD risk score (Arnett DK, et al., 2019) is: 9.7%    Assessment & Plan:   Problem List Items Addressed This Visit       Cardiovascular and Mediastinum   Paroxysmal atrial flutter (HCC)   History of paroxysmal with ablation.  Patient in regular rhythm today.  Currently maintained on Eliquis  5 mg twice daily.  Continue      Chronic deep vein thrombosis (DVT) of proximal vein of left lower extremity (HCC)   History of the same.  Currently maintained on Eliquis  5 mg twice daily continue      Erectile dysfunction due to arterial insufficiency   Currently followed by urology maintained on tadalafil  20 mg daily.  Continue        Endocrine   Male hypogonadism   Patient currently followed by urology on oral testosterone  replacement.  Continue taking medication as prescribed follow-up with specialist as recommended        Genitourinary   Genital herpes   History of same currently maintained on valacyclovir  500 mg daily for suppression therapy continue      Benign prostatic hyperplasia with urinary obstruction   History of the same patient currently on similar silodosin  and and finasteride .  Followed by urology.  Continue taking medication as prescribed follow-up with urology as recommended.  Patient only has nocturia x 1        Other   Hypertriglyceridemia   History of the same.  Patient currently maintained on ezetimibe  10 mg daily.  Pending lipid panel today      Relevant Orders   Lipid  panel   Preventative health care - Primary   Discussed age-appropriate immunizations and screening exams.  Did review patient's personal, surgical, social, family histories.  Patient up-to-date on all age-appropriate vaccinations he would like.  Patient up-to-date on CRC screening.  PSA for prostate cancer screening today.  Patient was given information  at discharge about preventative healthcare maintenance with anticipatory guidance.  We did discuss advance directive patient has healthcare power of attorney and living will.      Relevant Orders   CBC   Comprehensive metabolic panel with GFR   TSH   Muscle spasms of lower extremity   History of the same.  Predominantly left-sided.  Patient does have some varicosities.  He is taking magnesium and zinc.  Drinking at least 64 ounces of water  a day.  Has used Flexeril  in the past as needed.  Sending Flexeril  5 mg nightly as needed, sedation precautions reviewed.      Relevant Medications   cyclobenzaprine  (FLEXERIL ) 5 MG tablet   Other Visit Diagnoses       Screening for diabetes mellitus       Relevant Orders   Hemoglobin A1c     Screening for prostate cancer       Relevant Orders   PSA, Medicare       Return in about 1 year (around 06/18/2025) for CPE and Labs.    Adina Crandall, NP

## 2024-06-18 NOTE — Assessment & Plan Note (Signed)
 History of the same patient currently on similar silodosin  and and finasteride .  Followed by urology.  Continue taking medication as prescribed follow-up with urology as recommended.  Patient only has nocturia x 1

## 2024-06-18 NOTE — Assessment & Plan Note (Signed)
 History of the same.  Currently maintained on Eliquis  5 mg twice daily continue

## 2024-06-18 NOTE — Assessment & Plan Note (Signed)
 History of the same.  Predominantly left-sided.  Patient does have some varicosities.  He is taking magnesium and zinc.  Drinking at least 64 ounces of water  a day.  Has used Flexeril  in the past as needed.  Sending Flexeril  5 mg nightly as needed, sedation precautions reviewed.

## 2024-06-18 NOTE — Assessment & Plan Note (Signed)
 History of paroxysmal with ablation.  Patient in regular rhythm today.  Currently maintained on Eliquis  5 mg twice daily.  Continue

## 2024-06-18 NOTE — Progress Notes (Signed)
 Subjective:   Harold Schwartz is a 68 y.o. who presents for a Medicare Wellness preventive visit.  As a reminder, Annual Wellness Visits don't include a physical exam, and some assessments may be limited, especially if this visit is performed virtually. We may recommend an in-person follow-up visit with your provider if needed.  Visit Complete: Virtual I connected with  Harold Schwartz on 06/18/24 by a audio enabled telemedicine application and verified that I am speaking with the correct person using two identifiers.  Patient Location: Home  Provider Location: Home Office  I discussed the limitations of evaluation and management by telemedicine. The patient expressed understanding and agreed to proceed.  Vital Signs: Because this visit was a virtual/telehealth visit, some criteria may be missing or patient reported. Any vitals not documented were not able to be obtained and vitals that have been documented are patient reported.  VideoDeclined- This patient declined Librarian, academic. Therefore the visit was completed with audio only.  Persons Participating in Visit: Patient.  AWV Questionnaire: Yes: Patient Medicare AWV questionnaire was completed by the patient on 06/17/24; I have confirmed that all information answered by patient is correct and no changes since this date.  Cardiac Risk Factors include: advanced age (>70men, >13 women);male gender;hypertension     Objective:    Today's Vitals   06/18/24 0937  Weight: 210 lb (95.3 kg)  Height: 5' 11 (1.803 m)   Body mass index is 29.29 kg/m.     06/18/2024    9:44 AM 06/18/2023    9:52 AM 05/28/2023    7:03 AM 05/16/2018    7:51 AM 02/25/2018    7:04 PM 03/22/2017    6:07 AM 03/04/2017    2:34 PM  Advanced Directives  Does Patient Have a Medical Advance Directive? Yes Yes Yes Yes  Yes  Yes  Yes   Type of Estate Schwartz of [place];Living will Healthcare Power of [place];Living will  Healthcare Power of [place];Living will Healthcare Power of [place];Living will Healthcare Power of [employer] of [place];Living will Healthcare Power of [place];Living will  Does patient want to make changes to medical advance directive?  No - Patient declined  No - Patient declined   No - Patient declined  No - Patient declined   Copy of Healthcare Power of Attorney in Chart? Yes - validated most recent copy scanned in chart (See row information) Yes - validated most recent copy scanned in chart (See row information)  No - copy requested  Yes  Yes  No - copy requested      Data saved with a previous flowsheet row definition    Current Medications (verified) Outpatient Encounter Medications as of 06/18/2024  Medication Sig   Cholecalciferol (VITAMIN D3) 50 MCG (2000 UT) CAPS Take 6,000 Units by mouth.   cyclobenzaprine  (FLEXERIL ) 5 MG tablet Take 1 tablet (5 mg total) by mouth at bedtime as needed.   ELIQUIS  5 MG TABS tablet TAKE 1 TABLET BY MOUTH TWICE A DAY   ezetimibe  (ZETIA ) 10 MG tablet TAKE 1 TABLET BY MOUTH EVERY DAY   finasteride  (PROSCAR ) 5 MG tablet Take 1 tablet (5 mg total) by mouth daily.   Magnesium 300 MG CAPS Take 300 mg by mouth at bedtime.    Omega-3 Fatty Acids (FISH OIL) 1200 MG CAPS Take 2,400-3,600 capsules by mouth daily. Take 2,400 mg by mouth in the morning and 3,600 mg in the evening   omeprazole  (PRILOSEC) 20 MG capsule TAKE 1  CAPSULE BY MOUTH EVERY DAY   silodosin  (RAPAFLO ) 8 MG CAPS capsule Take 1 capsule (8 mg total) by mouth in the morning and at bedtime.   tadalafil  (CIALIS ) 20 MG tablet TAKE ONE TABLET BY MOUTH DAILY AS NEEDED FOR ERECTILE DYSFUNCTION   Testosterone  Undecanoate (JATENZO ) 237 MG CAPS Take 1 capsule (237 mg total) by mouth in the morning, at noon, and at bedtime.   valACYclovir  (VALTREX ) 1000 MG tablet TAKE 1/2 TABLET BY MOUTH DAILY   vitamin E 180 MG (400 UNITS) capsule Take 400 Units by mouth daily.   No facility-administered  encounter medications on file as of 06/18/2024.    Allergies (verified) Patient has no known allergies.   History: Past Medical History:  Diagnosis Date   Atrial flutter (HCC)    Colon polyps    Depression    DVT (deep vein thrombosis) in pregnancy    last one in March 2019 taking eliquis    Genital herpes    Heart murmur    Hemorrhoids    Hyperlipidemia    Hypogonadism in male    Neuromuscular disorder (HCC)    slight numbnes in right foot   Past Surgical History:  Procedure Laterality Date   A-FLUTTER ABLATION N/A 03/22/2017   Procedure: A-Flutter Ablation;  Surgeon: Elspeth JAYSON Sage, MD;  Location: [facility];  Service: Cardiovascular;  Laterality: N/A;   COLONOSCOPY WITH PROPOFOL  N/A 05/16/2018   Procedure: COLONOSCOPY WITH PROPOFOL ;  Surgeon: Jinny Carmine, MD;  [facility];  Service: Endoscopy;  Laterality: N/A;   COLONOSCOPY WITH PROPOFOL  N/A 05/28/2023   Procedure: COLONOSCOPY WITH PROPOFOL ;  Surgeon: Jinny Carmine, MD;  Location: [facility] ENDOSCOPY;  Service: Endoscopy;  Laterality: N/A;   KNEE ARTHROSCOPY     POLYPECTOMY  05/16/2018   Procedure: POLYPECTOMY;  Surgeon: Jinny Carmine, MD;  Location: [facility];  Service: Endoscopy;;   TENOTOMY ACHILLES TENDON Right 08/22/2018   VASECTOMY  1990   Family History  Problem Relation Age of Onset   Breast cancer Mother    Heart disease Mother    Hyperlipidemia Mother    Hypertension Mother    Colon cancer Father    Heart disease Father    Alzheimer's disease Father    Hyperlipidemia Father    Cancer Father    Multiple sclerosis Sister        primary progessive MS   Hyperlipidemia Brother    Cancer Brother 31       Pancreatic 2020   Cancer Brother    Social History   Socioeconomic History   Marital status: Married    Spouse name: Vivan   Number of children: 2   Years of education: Not on file   Highest education level: Master's degree (e.g., MA, MS, MEng, MEd, MSW, MBA)  Occupational  History   Occupation: TEFL teacher  Tobacco Use   Smoking status: Never   Smokeless tobacco: Never  Vaping Use   Vaping status: Never Used  Substance and Sexual Activity   Alcohol use: Not Currently    Alcohol/week: 7.0 standard drinks of alcohol    Types: 7 Glasses of wine per week    Comment: moderate   Drug use: No   Sexual activity: Yes  Other Topics Concern   Not on file  Social History Narrative   Semi retired: works 10-12 hours a month.   Self employed    Social Drivers of Corporate investment banker Strain: Low Risk  (06/17/2024)   Overall Financial  Resource Strain (CARDIA)    Difficulty of Paying Living Expenses: Not hard at all  Food Insecurity: No Food Insecurity (06/17/2024)   Hunger Vital Sign    Worried About Running Out of Food in the Last Year: Never true    Ran Out of Food in the Last Year: Never true  Transportation Needs: No Transportation Needs (06/17/2024)   PRAPARE - Administrator, Civil Service (Medical): No    Lack of Transportation (Non-Medical): No  Physical Activity: Sufficiently Active (06/17/2024)   Exercise Vital Sign    Days of Exercise per Week: 6 days    Minutes of Exercise per Session: 80 min  Stress: No Stress Concern Present (06/17/2024)   [employer] of Occupational Health - Occupational Stress Questionnaire    Feeling of Stress: Not at all  Social Connections: Socially Integrated (06/17/2024)   Social Connection and Isolation Panel    Frequency of Communication with Friends and Family: Three times a week    Frequency of Social Gatherings with Friends and Family: Twice a week    Attends Religious Services: More than 4 times per year    Active Member of [employer] or Organizations: Yes    Attends Engineer, structural: More than 4 times per year    Marital Status: Married    Tobacco Counseling Counseling given: Not Answered  Clinical Intake:  Pre-visit preparation completed: Yes  Pain : No/denies pain    BMI  - recorded: 29.29 Nutritional Status: BMI 25 -29 Overweight Nutritional Risks: None Diabetes: No  Lab Results  Component Value Date   HGBA1C 5.5 09/15/2019     How often do you need to have someone help you when you read instructions, pamphlets, or other written materials from your doctor or pharmacy?: 1 - Never  Interpreter Needed?: No  Comments: lives with spouse Information entered by :: B.Alisha Burgo,LPN   Activities of Daily Living     06/17/2024   10:02 AM  In your present state of health, do you have any difficulty performing the following activities:  Hearing? 0  Vision? 0  Difficulty concentrating or making decisions? 0  Walking or climbing stairs? 0  Dressing or bathing? 0  Doing errands, shopping? 0  Preparing Food and eating ? N  Using the Toilet? N  In the past six months, have you accidently leaked urine? Y  Do you have problems with loss of bowel control? N  Managing your Medications? N  Managing your Finances? N  Housekeeping or managing your Housekeeping? N    Patient Care Team: Wendee Lynwood HERO, NP as PCP - General Fernande Elspeth BROCKS, MD as Consulting Physician (Cardiology)  I have updated your Care Teams any recent Medical Services you may have received from other providers in the past year.     Assessment:   This is a routine wellness examination for Kamonte.  Hearing/Vision screen Hearing Screening - Comments:: Pt says his hearing is little less but adequate Vision Screening - Comments:: Pt says his vision is good w/glasses [facility]   Goals Addressed             This Visit's Progress    Remain active and independent   On track    06/18/24       Depression Screen     06/18/2024    9:43 AM 06/18/2024    8:29 AM 03/09/2024    9:00 AM 06/18/2023   10:05 AM 04/22/2023    1:59 PM 04/27/2022  10:03 AM 03/22/2022    8:07 AM  PHQ 2/9 Scores  PHQ - 2 Score 0 0 0 2 2 0 0  PHQ- 9 Score  1 0 3 3      Fall Risk     06/18/2024    8:29 AM  06/17/2024   10:02 AM 03/09/2024    9:00 AM 06/18/2023    9:57 AM 06/18/2023    8:53 AM  Fall Risk   Falls in the past year? 0 0 0 0 0  Number falls in past yr: 0  0 0 0  Injury with Fall? 0 0 0 0 0  Risk for fall due to : No Fall Risks No Fall Risks No Fall Risks No Fall Risks   Follow up Falls evaluation completed Education provided;Falls prevention discussed Falls evaluation completed Falls prevention discussed;Education provided;Falls evaluation completed     MEDICARE RISK AT HOME:  Medicare Risk at Home Any stairs in or around the home?: (Patient-Rptd) Yes If so, are there any without handrails?: (Patient-Rptd) No Home free of loose throw rugs in walkways, pet beds, electrical cords, etc?: (Patient-Rptd) Yes Adequate lighting in your home to reduce risk of falls?: (Patient-Rptd) Yes Life alert?: (Patient-Rptd) No Use of a cane, walker or w/c?: (Patient-Rptd) No Grab bars in the bathroom?: (Patient-Rptd) Yes Shower chair or bench in shower?: (Patient-Rptd) Yes Elevated toilet seat or a handicapped toilet?: (Patient-Rptd) No  TIMED UP AND GO:  Was the test performed?  No  Cognitive Function: 6CIT completed        06/18/2024    9:46 AM 06/18/2023   10:02 AM  6CIT Screen  What Year? 0 points 0 points  What month? 0 points 0 points  What time? 0 points 0 points  Count back from 20 0 points 0 points  Months in reverse 0 points 0 points  Repeat phrase 0 points 0 points  Total Score 0 points 0 points    Immunizations Immunization History  Administered Date(s) Administered   Fluad Quad(high Dose 65+) 09/08/2021, 08/22/2022   Influenza Inj Mdck Quad Pf 10/24/2018   Influenza, High Dose Seasonal PF 09/01/2020   Influenza,inj,Quad PF,6+ Mos 09/09/2015, 10/26/2016, 10/21/2017, 10/24/2018, 08/11/2019   Influenza-Unspecified 10/17/2013, 08/23/2023   PFIZER Comirnaty(Gray Top)Covid-19 Tri-Sucrose Vaccine 03/17/2021   PFIZER(Purple Top)SARS-COV-2 Vaccination 01/15/2020, 02/08/2020,  09/01/2020   Pfizer Covid-19 Vaccine Bivalent Booster 59yrs & up 09/08/2021, 08/04/2022, 10/29/2022   Pneumococcal Conjugate-13 12/19/2020   Pneumococcal Polysaccharide-23 08/22/2022   Pneumococcal-Unspecified 01/18/2012   Respiratory Syncytial Virus Vaccine,Recomb Aduvanted(Arexvy) 08/04/2022   Tdap 04/25/2015   Unspecified SARS-COV-2 Vaccination 08/23/2023   Zoster Recombinant(Shingrix) 01/12/2022, 04/20/2022   Zoster, Live 05/03/2015    Screening Tests Health Maintenance  Topic Date Due   COVID-19 Vaccine (9 - Pfizer risk 2024-25 season) 02/20/2024   INFLUENZA VACCINE  07/17/2024   DTaP/Tdap/Td (2 - Td or Tdap) 04/24/2025   Medicare Annual Wellness (AWV)  06/18/2025   Colonoscopy  05/27/2030   Pneumococcal Vaccine: 50+ Years  Completed   Hepatitis C Screening  Completed   Zoster Vaccines- Shingrix  Completed   Hepatitis B Vaccines  Aged Out   HPV VACCINES  Aged Out   Meningococcal B Vaccine  Aged Out    Health Maintenance  Health Maintenance Due  Topic Date Due   COVID-19 Vaccine (9 - Pfizer risk 2024-25 season) 02/20/2024   Health Maintenance Items Addressed:none needed at this time  Additional Screening:  Vision Screening: Recommended annual ophthalmology exams for early detection of glaucoma and  other disorders of the eye. Would you like a referral to an eye doctor? No    Dental Screening: Recommended annual dental exams for proper oral hygiene  Community Resource Referral / Chronic Care Management: CRR required this visit?  No   CCM required this visit?  No   Plan:    I have personally reviewed and noted the following in the patient's chart:   Medical and social history Use of alcohol, tobacco or illicit drugs  Current medications and supplements including opioid prescriptions. Patient is not currently taking opioid prescriptions. Functional ability and status Nutritional status Physical activity Advanced directives List of other  physicians Hospitalizations, surgeries, and ER visits in previous 12 months Vitals Screenings to include cognitive, depression, and falls Referrals and appointments  In addition, I have reviewed and discussed with patient certain preventive protocols, quality metrics, and best practice recommendations. A written personalized care plan for preventive services as well as general preventive health recommendations were provided to patient.   Erminio LITTIE Saris, LPN   01/22/7973   After Visit Summary: (MyChart) Due to this being a telephonic visit, the after visit summary with patients personalized plan was offered to patient via MyChart   Notes: Nothing significant to report at this time.

## 2024-06-18 NOTE — Assessment & Plan Note (Signed)
 Discussed age-appropriate immunizations and screening exams.  Did review patient's personal, surgical, social, family histories.  Patient up-to-date on all age-appropriate vaccinations he would like.  Patient up-to-date on CRC screening.  PSA for prostate cancer screening today.  Patient was given information at discharge about preventative healthcare maintenance with anticipatory guidance.  We did discuss advance directive patient has healthcare power of attorney and living will.

## 2024-06-18 NOTE — Assessment & Plan Note (Signed)
 History of the same.  Patient currently maintained on ezetimibe  10 mg daily.  Pending lipid panel today

## 2024-06-18 NOTE — Patient Instructions (Signed)
Nice to see you today I will be in touch with the labs once I have reviewed them Follow up with me in 1 year, sooner If you need me

## 2024-06-18 NOTE — Assessment & Plan Note (Signed)
 Currently followed by urology maintained on tadalafil  20 mg daily.  Continue

## 2024-06-23 ENCOUNTER — Ambulatory Visit: Payer: Self-pay | Admitting: Nurse Practitioner

## 2024-06-26 ENCOUNTER — Other Ambulatory Visit: Payer: Self-pay | Admitting: Nurse Practitioner

## 2024-06-26 DIAGNOSIS — E781 Pure hyperglyceridemia: Secondary | ICD-10-CM

## 2024-07-13 ENCOUNTER — Emergency Department

## 2024-07-13 ENCOUNTER — Other Ambulatory Visit: Payer: Self-pay

## 2024-07-13 ENCOUNTER — Emergency Department
Admission: EM | Admit: 2024-07-13 | Discharge: 2024-07-13 | Disposition: A | Discharge status: Home / Self Care | Attending: Emergency Medicine | Admitting: Emergency Medicine

## 2024-07-13 DIAGNOSIS — I82412 Acute embolism and thrombosis of left femoral vein: Secondary | ICD-10-CM | POA: Diagnosis not present

## 2024-07-13 DIAGNOSIS — Z7901 Long term (current) use of anticoagulants: Secondary | ICD-10-CM | POA: Diagnosis not present

## 2024-07-13 DIAGNOSIS — M7989 Other specified soft tissue disorders: Secondary | ICD-10-CM | POA: Diagnosis not present

## 2024-07-13 DIAGNOSIS — M79605 Pain in left leg: Secondary | ICD-10-CM | POA: Diagnosis not present

## 2024-07-13 LAB — CBC WITH DIFFERENTIAL/PLATELET
Abs Immature Granulocytes: 0.01 K/uL (ref 0.00–0.07)
Basophils Absolute: 0 K/uL (ref 0.0–0.1)
Basophils Relative: 1 %
Eosinophils Absolute: 0.2 K/uL (ref 0.0–0.5)
Eosinophils Relative: 3 %
HCT: 48.8 % (ref 39.0–52.0)
Hemoglobin: 16.8 g/dL (ref 13.0–17.0)
Immature Granulocytes: 0 %
Lymphocytes Relative: 40 %
Lymphs Abs: 2 K/uL (ref 0.7–4.0)
MCH: 31.3 pg (ref 26.0–34.0)
MCHC: 34.4 g/dL (ref 30.0–36.0)
MCV: 90.9 fL (ref 80.0–100.0)
Monocytes Absolute: 0.4 K/uL (ref 0.1–1.0)
Monocytes Relative: 8 %
Neutro Abs: 2.5 K/uL (ref 1.7–7.7)
Neutrophils Relative %: 48 %
Platelets: 146 K/uL — ABNORMAL LOW (ref 150–400)
RBC: 5.37 MIL/uL (ref 4.22–5.81)
RDW: 13 % (ref 11.5–15.5)
WBC: 5.1 K/uL (ref 4.0–10.5)
nRBC: 0 % (ref 0.0–0.2)

## 2024-07-13 LAB — BASIC METABOLIC PANEL WITH GFR
Anion gap: 11 (ref 5–15)
BUN: 20 mg/dL (ref 8–23)
CO2: 26 mmol/L (ref 22–32)
Calcium: 9 mg/dL (ref 8.9–10.3)
Chloride: 105 mmol/L (ref 98–111)
Creatinine, Ser: 1.28 mg/dL — ABNORMAL HIGH (ref 0.61–1.24)
GFR, Estimated: >60 mL/min
Glucose, Bld: 109 mg/dL — ABNORMAL HIGH (ref 70–99)
Potassium: 3.9 mmol/L (ref 3.5–5.1)
Sodium: 142 mmol/L (ref 135–145)

## 2024-07-13 LAB — D-DIMER, QUANTITATIVE: D-Dimer, Quant: 0.53 ug{FEU}/mL — ABNORMAL HIGH (ref 0.00–0.50)

## 2024-07-13 NOTE — ED Notes (Signed)
Rainbow sent to the lab at this time.  

## 2024-07-13 NOTE — ED Provider Notes (Signed)
 [facility] Provider Note    Event Date/Time   First MD Initiated Contact with Patient 07/13/24 2237     (approximate)   History   Leg Swelling   HPI  Strummer Canipe is a 68 y.o. male with a history of DVT typically on Eliquis  who reports that he missed his Eliquis  dose accidentally for 4 days, started having swelling in his left foot.  No chest pain no shortness of breath.  He has restarted his Eliquis      Physical Exam   Triage Vital Signs: ED Triage Vitals  Encounter Vitals Group     BP 07/13/24 2147 (!) 150/91     Girls Systolic BP Percentile --      Girls Diastolic BP Percentile --      Boys Systolic BP Percentile --      Boys Diastolic BP Percentile --      Pulse Rate 07/13/24 2147 64     Resp 07/13/24 2147 18     Temp 07/13/24 2147 98.2 F (36.8 C)     Temp src --      SpO2 07/13/24 2147 98 %     Weight 07/13/24 2146 95.3 kg (210 lb)     Height 07/13/24 2146 1.803 m (5' 11)     Head Circumference --      Peak Flow --      Pain Score 07/13/24 2146 5     Pain Loc --      Pain Education --      Exclude from Growth Chart --     Most recent vital signs: Vitals:   07/13/24 2147  BP: (!) 150/91  Pulse: 64  Resp: 18  Temp: 98.2 F (36.8 C)  SpO2: 98%     General: Awake, no distress.  CV:  Good peripheral perfusion.  Resp:  Normal effort.  Abd:  No distention.  Other:  Mild swelling to the left lower extremity   ED Results / Procedures / Treatments   Labs (all labs ordered are listed, but only abnormal results are displayed) Labs Reviewed  CBC WITH DIFFERENTIAL/PLATELET - Abnormal; Notable for the following components:      Result Value   Platelets 146 (*)    All other components within normal limits  BASIC METABOLIC PANEL WITH GFR - Abnormal; Notable for the following components:   Glucose, Bld 109 (*)    Creatinine, Ser 1.28 (*)    All other components within normal limits  D-DIMER, QUANTITATIVE - Abnormal; Notable  for the following components:   D-Dimer, Quant 0.53 (*)    All other components within normal limits     EKG     RADIOLOGY Ultrasound demonstrates nonocclusive DVT left femoral left popliteal vein    PROCEDURES:  Critical Care performed:   Procedures   MEDICATIONS ORDERED IN ED: Medications - No data to display   IMPRESSION / MDM / ASSESSMENT AND PLAN / ED COURSE  I reviewed the triage vital signs and the nursing notes. Patient's presentation is most consistent with severe exacerbation of chronic illness.  Patient with multiple DVTs, on chronic Eliquis  who presents with left lower extremity swelling after missing 4 days of Eliquis  accidentally.  Lab work is reassuring, no chest pain or shortness of breath to suggest PE  He has restarted his Eliquis   Ultrasound demonstrates left nonocclusive DVT, will refer to vascular surgery, no indication for admission at this time        FINAL CLINICAL IMPRESSION(S) /  ED DIAGNOSES   Final diagnoses:  Acute deep vein thrombosis (DVT) of femoral vein of left lower extremity (HCC)     Rx / DC Orders   ED Discharge Orders     None        Note:  This document was prepared using Dragon voice recognition software and may include unintentional dictation errors.   Arlander Charleston, MD 07/13/24 [phone]

## 2024-07-13 NOTE — ED Triage Notes (Signed)
 Pt reports he has forgotten to take his eliquis  for the past 4 days and earlier today noticed left lower leg pain and swelling. Pt has hx DVT.

## 2024-07-21 ENCOUNTER — Encounter (INDEPENDENT_AMBULATORY_CARE_PROVIDER_SITE_OTHER): Payer: Self-pay | Admitting: Vascular Surgery

## 2024-07-21 ENCOUNTER — Ambulatory Visit (INDEPENDENT_AMBULATORY_CARE_PROVIDER_SITE_OTHER): Payer: Self-pay | Admitting: Vascular Surgery

## 2024-07-21 VITALS — BP 125/81 | HR 70 | Ht 71.0 in | Wt 215.0 lb

## 2024-07-21 DIAGNOSIS — I87009 Postthrombotic syndrome without complications of unspecified extremity: Secondary | ICD-10-CM

## 2024-07-21 DIAGNOSIS — I825Y2 Chronic embolism and thrombosis of unspecified deep veins of left proximal lower extremity: Secondary | ICD-10-CM

## 2024-07-21 NOTE — Assessment & Plan Note (Signed)
Symptoms are well controlled at this point.

## 2024-07-21 NOTE — Progress Notes (Unsigned)
 Patient ID: Harold Schwartz, male   DOB: Aug 02, 1956, 68 y.o.   MRN: [id number]  No chief complaint on file.   HPI Harold Schwartz is a 68 y.o. male.  I am asked to see the patient by Dr. Arlander for evaluation of his DVT. ***.     Past Medical History:  Diagnosis Date   Atrial flutter (HCC)    Colon polyps    Depression    DVT (deep vein thrombosis) in pregnancy    last one in March 2019 taking eliquis    Genital herpes    Heart murmur    Hemorrhoids    Hyperlipidemia    Hypogonadism in male    Neuromuscular disorder (HCC)    slight numbnes in right foot    Past Surgical History:  Procedure Laterality Date   A-FLUTTER ABLATION N/A 03/22/2017   Procedure: A-Flutter Ablation;  Surgeon: Elspeth JAYSON Sage, MD;  Location: [facility];  Service: Cardiovascular;  Laterality: N/A;   COLONOSCOPY WITH PROPOFOL  N/A 05/16/2018   Procedure: COLONOSCOPY WITH PROPOFOL ;  Surgeon: Jinny Carmine, MD;  Location: [facility];  Service: Endoscopy;  Laterality: N/A;   COLONOSCOPY WITH PROPOFOL  N/A 05/28/2023   Procedure: COLONOSCOPY WITH PROPOFOL ;  Surgeon: Jinny Carmine, MD;  Location: [facility] ENDOSCOPY;  Service: Endoscopy;  Laterality: N/A;   KNEE ARTHROSCOPY     POLYPECTOMY  05/16/2018   Procedure: POLYPECTOMY;  Surgeon: Jinny Carmine, MD;  Location: [facility];  Service: Endoscopy;;   TENOTOMY ACHILLES TENDON Right 08/22/2018   VASECTOMY  1990     Family History  Problem Relation Age of Onset   Breast cancer Mother    Heart disease Mother    Hyperlipidemia Mother    Hypertension Mother    Colon cancer Father    Heart disease Father    Alzheimer's disease Father    Hyperlipidemia Father    Cancer Father    Multiple sclerosis Sister        primary progessive MS   Hyperlipidemia Brother    Cancer Brother 71       Pancreatic 2020   Cancer Brother      Social History   Tobacco Use   Smoking status: Never   Smokeless tobacco: Never  Vaping Use   Vaping status:  Never Used  Substance Use Topics   Alcohol use: Not Currently    Alcohol/week: 7.0 standard drinks of alcohol    Types: 7 Glasses of wine per week    Comment: moderate   Drug use: No     No Known Allergies  Current Outpatient Medications  Medication Sig Dispense Refill   Cholecalciferol (VITAMIN D3) 50 MCG (2000 UT) CAPS Take 6,000 Units by mouth.     cyclobenzaprine  (FLEXERIL ) 5 MG tablet Take 1 tablet (5 mg total) by mouth at bedtime as needed. 20 tablet 0   ELIQUIS  5 MG TABS tablet TAKE 1 TABLET BY MOUTH TWICE A DAY 180 tablet 1   ezetimibe  (ZETIA ) 10 MG tablet TAKE 1 TABLET BY MOUTH EVERY DAY 90 tablet 3   finasteride  (PROSCAR ) 5 MG tablet Take 1 tablet (5 mg total) by mouth daily. 90 tablet 3   Magnesium 300 MG CAPS Take 300 mg by mouth at bedtime.      Omega-3 Fatty Acids (FISH OIL) 1200 MG CAPS Take 2,400-3,600 capsules by mouth daily. Take 2,400 mg by mouth in the morning and 3,600 mg in the evening     omeprazole  (PRILOSEC) 20 MG capsule TAKE  1 CAPSULE BY MOUTH EVERY DAY 30 capsule 0   silodosin  (RAPAFLO ) 8 MG CAPS capsule Take 1 capsule (8 mg total) by mouth in the morning and at bedtime. 180 capsule 3   tadalafil  (CIALIS ) 20 MG tablet TAKE ONE TABLET BY MOUTH DAILY AS NEEDED FOR ERECTILE DYSFUNCTION 30 tablet 3   Testosterone  Undecanoate (JATENZO ) 237 MG CAPS Take 1 capsule (237 mg total) by mouth in the morning, at noon, and at bedtime. 90 capsule 3   valACYclovir  (VALTREX ) 1000 MG tablet TAKE 1/2 TABLET BY MOUTH DAILY 45 tablet 1   vitamin E 180 MG (400 UNITS) capsule Take 400 Units by mouth daily.     No current facility-administered medications for this visit.      REVIEW OF SYSTEMS (Negative unless checked)  Constitutional: [] Weight loss  [] Fever  [] Chills Cardiac: [] Chest pain   [] Chest pressure   [x] Palpitations   [] Shortness of breath when laying flat   [] Shortness of breath at rest   [] Shortness of breath with exertion. Vascular:  [] Pain in legs with walking    [] Pain in legs at rest   [] Pain in legs when laying flat   [] Claudication   [] Pain in feet when walking  [] Pain in feet at rest  [] Pain in feet when laying flat   [x] History of DVT   [] Phlebitis   [x] Swelling in legs   [] Varicose veins   [] Non-healing ulcers Pulmonary:   [] Uses home oxygen   [] Productive cough   [] Hemoptysis   [] Wheeze  [] COPD   [] Asthma Neurologic:  [] Dizziness  [] Blackouts   [] Seizures   [] History of stroke   [] History of TIA  [] Aphasia   [] Temporary blindness   [] Dysphagia   [] Weakness or numbness in arms   [] Weakness or numbness in legs Musculoskeletal:  [] Arthritis   [] Joint swelling   [] Joint pain   [] Low back pain Hematologic:  [] Easy bruising  [] Easy bleeding   [] Hypercoagulable state   [] Anemic  [] Hepatitis Gastrointestinal:  [] Blood in stool   [] Vomiting blood  [] Gastroesophageal reflux/heartburn   [] Abdominal pain Genitourinary:  [] Chronic kidney disease   [] Difficult urination  [] Frequent urination  [] Burning with urination   [] Hematuria Skin:  [] Rashes   [] Ulcers   [] Wounds Psychological:  [] History of anxiety   [x]  History of major depression.    Physical Exam There were no vitals taken for this visit. Gen:  WD/WN, NAD Head: [place]/AT, No temporalis wasting.  Ear/Nose/Throat: Hearing grossly intact, nares w/o erythema or drainage, oropharynx w/o Erythema/Exudate Eyes: Conjunctiva clear, sclera non-icteric  Neck: trachea midline.  No JVD.  Pulmonary:  Good air movement, respirations not labored, no use of accessory muscles  Cardiac: RRR, no JVD Vascular:  Vessel Right Left  Radial Palpable Palpable                                   Gastrointestinal:. No masses, surgical incisions, or scars. Musculoskeletal: M/S 5/5 throughout.  Extremities without ischemic changes.  No deformity or atrophy. No appreciable LE edema. Neurologic: Sensation grossly intact in extremities.  Symmetrical.  Speech is fluent. Motor exam as listed above. Psychiatric: Judgment  intact, Mood & affect appropriate for pt's clinical situation. Dermatologic: No rashes or ulcers noted.  No cellulitis or open wounds.    Radiology US  Venous Img Lower Unilateral Left Result Date: 07/13/2024 CLINICAL DATA:  Swelling and pain EXAM: LEFT LOWER EXTREMITY VENOUS DOPPLER ULTRASOUND TECHNIQUE: Gray-scale sonography with compression, as well  as color and duplex ultrasound, were performed to evaluate the deep venous system(s) from the level of the common femoral vein through the popliteal and proximal calf veins. COMPARISON:  None Available. FINDINGS: VENOUS There is nonocclusive thrombus within the left femoral vein distally as well as popliteal vein and peroneal vein. Normal compressibility of the common femoral, superficial femoral and posterior tibial vein. Visualized portions of profunda femoral vein and great saphenous vein unremarkable. No other filling defects to suggest DVT on grayscale or color Doppler imaging. Doppler waveforms show normal direction of venous flow, normal respiratory plasticity and response to augmentation. Limited views of the contralateral common femoral vein are unremarkable. OTHER None. Limitations: none IMPRESSION: Nonocclusive thrombus within the distal left femoral vein, popliteal vein and peroneal vein. Electronically Signed   By: Greig Pique M.D.   On: 07/13/2024 23:06    Labs Recent Results (from the past 2160 hours)  Comprehensive metabolic panel with GFR     Status: Abnormal   Collection Time: 05/12/24  7:39 AM  Result Value Ref Range   Glucose 88 70 - 99 mg/dL   BUN 16 8 - 27 mg/dL   Creatinine, Ser 8.81 0.76 - 1.27 mg/dL   eGFR 67 >40 fO/fpw/8.26   BUN/Creatinine Ratio 14 10 - 24   Sodium 141 134 - 144 mmol/L   Potassium 4.6 3.5 - 5.2 mmol/L   Chloride 105 96 - 106 mmol/L   CO2 23 20 - 29 mmol/L   Calcium  9.3 8.6 - 10.2 mg/dL   Total Protein 6.3 6.0 - 8.5 g/dL   Albumin 4.0 3.9 - 4.9 g/dL   Globulin, Total 2.3 1.5 - 4.5 g/dL   Bilirubin  Total 0.4 0.0 - 1.2 mg/dL   Alkaline Phosphatase 43 (L) 44 - 121 IU/L   AST 22 0 - 40 IU/L   ALT 23 0 - 44 IU/L  CBC     Status: None   Collection Time: 05/12/24  7:39 AM  Result Value Ref Range   WBC 5.0 3.4 - 10.8 x10E3/uL   RBC 5.27 4.14 - 5.80 x10E6/uL   Hemoglobin 17.3 13.0 - 17.7 g/dL   Hematocrit 49.9 62.4 - 51.0 %   MCV 95 79 - 97 fL   MCH 32.8 26.6 - 33.0 pg   MCHC 34.6 31.5 - 35.7 g/dL   RDW 86.7 88.3 - 84.5 %   Platelets 151 150 - 450 x10E3/uL  Testosterone ,Free and Total     Status: Abnormal   Collection Time: 05/12/24  7:39 AM  Result Value Ref Range   Testosterone  722 264 - 916 ng/dL    Comment: Adult male reference interval is based on a population of healthy nonobese males (BMI <30) between 22 and 20 years old. Travison, et.al. JCEM [phone]. PMID: [id number]. **Verified by repeat analysis**    Testosterone , Free 41.5 (H) 6.6 - 18.1 pg/mL  CBC     Status: Abnormal   Collection Time: 06/18/24  8:45 AM  Result Value Ref Range   WBC 4.4 4.0 - 10.5 K/uL   RBC 5.33 4.22 - 5.81 Mil/uL   Platelets 145.0 (L) 150.0 - 400.0 K/uL   Hemoglobin 16.5 13.0 - 17.0 g/dL   HCT 50.6 60.9 - 47.9 %   MCV 92.5 78.0 - 100.0 fl   MCHC 33.6 30.0 - 36.0 g/dL   RDW 85.9 88.4 - 84.4 %  Comprehensive metabolic panel with GFR     Status: Abnormal   Collection Time: 06/18/24  8:45 AM  Result  Value Ref Range   Sodium 140 135 - 145 mEq/L   Potassium 4.4 3.5 - 5.1 mEq/L   Chloride 105 96 - 112 mEq/L   CO2 31 19 - 32 mEq/L   Glucose, Bld 89 70 - 99 mg/dL   BUN 14 6 - 23 mg/dL   Creatinine, Ser 8.87 0.40 - 1.50 mg/dL   Total Bilirubin 1.0 0.2 - 1.2 mg/dL   Alkaline Phosphatase 33 (L) 39 - 117 U/L   AST 17 0 - 37 U/L   ALT 17 0 - 53 U/L   Total Protein 6.6 6.0 - 8.3 g/dL   Albumin 4.0 3.5 - 5.2 g/dL   GFR 32.92 >39.99 mL/min    Comment: Calculated using the CKD-EPI Creatinine Equation (2021)   Calcium  8.7 8.4 - 10.5 mg/dL  Hemoglobin J8r     Status: None   Collection Time:  06/18/24  8:45 AM  Result Value Ref Range   Hgb A1c MFr Bld 5.4 4.6 - 6.5 %    Comment: Glycemic Control Guidelines for People with Diabetes:Non Diabetic:  <6%Goal of Therapy: <7%Additional Action Suggested:  >8%   TSH     Status: None   Collection Time: 06/18/24  8:45 AM  Result Value Ref Range   TSH 2.26 0.35 - 5.50 uIU/mL  PSA, Medicare     Status: None   Collection Time: 06/18/24  8:45 AM  Result Value Ref Range   PSA 3.55 0.10 - 4.00 ng/ml    Comment: Test performed using Access Hybritech PSA Assay, a parmagnetic partical, chemiluminecent immunoassay.  Lipid panel     Status: Abnormal   Collection Time: 06/18/24  8:45 AM  Result Value Ref Range   Cholesterol 172 0 - 200 mg/dL    Comment: ATP III Classification       Desirable:  < 200 mg/dL               Borderline High:  200 - 239 mg/dL          High:  > = 759 mg/dL   Triglycerides 48.9 0.0 - 149.0 mg/dL    Comment: Normal:  <849 mg/dLBorderline High:  150 - 199 mg/dL   HDL 46.99 >60.99 mg/dL   VLDL 89.7 0.0 - 59.9 mg/dL   LDL Cholesterol 890 (H) 0 - 99 mg/dL   Total CHOL/HDL Ratio 3     Comment:                Men          Women1/2 Average Risk     3.4          3.3Average Risk          5.0          4.42X Average Risk          9.6          7.13X Average Risk          15.0          11.0                       NonHDL 119.11     Comment: NOTE:  Non-HDL goal should be 30 mg/dL higher than patient's LDL goal (i.e. LDL goal of < 70 mg/dL, would have non-HDL goal of < 100 mg/dL)  CBC with Differential     Status: Abnormal   Collection Time: 07/13/24  9:48 PM  Result Value Ref Range  WBC 5.1 4.0 - 10.5 K/uL   RBC 5.37 4.22 - 5.81 MIL/uL   Hemoglobin 16.8 13.0 - 17.0 g/dL   HCT 51.1 60.9 - 47.9 %   MCV 90.9 80.0 - 100.0 fL   MCH 31.3 26.0 - 34.0 pg   MCHC 34.4 30.0 - 36.0 g/dL   RDW 86.9 88.4 - 84.4 %   Platelets 146 (L) 150 - 400 K/uL   nRBC 0.0 0.0 - 0.2 %   Neutrophils Relative % 48 %   Neutro Abs 2.5 1.7 - 7.7 K/uL    Lymphocytes Relative 40 %   Lymphs Abs 2.0 0.7 - 4.0 K/uL   Monocytes Relative 8 %   Monocytes Absolute 0.4 0.1 - 1.0 K/uL   Eosinophils Relative 3 %   Eosinophils Absolute 0.2 0.0 - 0.5 K/uL   Basophils Relative 1 %   Basophils Absolute 0.0 0.0 - 0.1 K/uL   Immature Granulocytes 0 %   Abs Immature Granulocytes 0.01 0.00 - 0.07 K/uL    Comment: Performed at [facility], [address] [place], [place] [address]  Basic metabolic panel     Status: Abnormal   Collection Time: 07/13/24  9:48 PM  Result Value Ref Range   Sodium 142 135 - 145 mmol/L   Potassium 3.9 3.5 - 5.1 mmol/L   Chloride 105 98 - 111 mmol/L   CO2 26 22 - 32 mmol/L   Glucose, Bld 109 (H) 70 - 99 mg/dL    Comment: Glucose reference range applies only to samples taken after fasting for at least 8 hours.   BUN 20 8 - 23 mg/dL   Creatinine, Ser 8.71 (H) 0.61 - 1.24 mg/dL   Calcium  9.0 8.9 - 10.3 mg/dL   GFR, Estimated >39 >39 mL/min    Comment: (NOTE) Calculated using the CKD-EPI Creatinine Equation (2021)    Anion gap 11 5 - 15    Comment: Performed at [facility], [address] [place], [place] [address]  D-dimer, quantitative     Status: Abnormal   Collection Time: 07/13/24  9:48 PM  Result Value Ref Range   D-Dimer, Quant 0.53 (H) 0.00 - 0.50 ug/mL-FEU    Comment: (NOTE) At the manufacturer cut-off value of 0.5 g/mL FEU, this assay has a negative predictive value of 95-100%.This assay is intended for use in conjunction with a clinical pretest probability (PTP) assessment model to exclude pulmonary embolism (PE) and deep venous thrombosis (DVT) in outpatients suspected of PE or DVT. Results should be correlated with clinical presentation. Performed at [facility], [address] [place], [place] [address]     Assessment/Plan:  No problem-specific Assessment & Plan notes found for this encounter.      Selinda Gu 07/21/2024, 9:42 AM   This note was created with  Dragon medical transcription system.  Any errors from dictation are unintentional.

## 2024-07-22 NOTE — Assessment & Plan Note (Signed)
 Recent duplex study showed only nonocclusive chronic appearing thrombus in the left femoral vein.  No acute thrombus was seen.  He had some recurrent swelling when he came off his anticoagulation and he has a previous history of multiple previous DVTs.  As such, staying on anticoagulation indefinitely would be recommended.  I will see him back as needed.

## 2024-08-02 ENCOUNTER — Other Ambulatory Visit: Payer: Self-pay | Admitting: Nurse Practitioner

## 2024-08-02 DIAGNOSIS — I825Y2 Chronic embolism and thrombosis of unspecified deep veins of left proximal lower extremity: Secondary | ICD-10-CM

## 2024-08-16 ENCOUNTER — Encounter: Payer: Self-pay | Admitting: Nurse Practitioner

## 2024-08-21 DIAGNOSIS — Z2989 Encounter for other specified prophylactic measures: Secondary | ICD-10-CM | POA: Diagnosis not present

## 2024-08-21 DIAGNOSIS — Z23 Encounter for immunization: Secondary | ICD-10-CM | POA: Diagnosis not present

## 2024-09-16 DIAGNOSIS — L570 Actinic keratosis: Secondary | ICD-10-CM | POA: Diagnosis not present

## 2024-09-16 DIAGNOSIS — Z0000 Encounter for general adult medical examination without abnormal findings: Secondary | ICD-10-CM | POA: Diagnosis not present

## 2024-10-01 ENCOUNTER — Encounter: Payer: Self-pay | Admitting: Nurse Practitioner

## 2024-10-01 ENCOUNTER — Telehealth: Admitting: Physician Assistant

## 2024-10-01 DIAGNOSIS — R5383 Other fatigue: Secondary | ICD-10-CM

## 2024-10-01 NOTE — Progress Notes (Signed)
 Virtual Visit Consent   Harold Schwartz, you are scheduled for a virtual visit with a [facility] provider today. Just as with appointments in the office, your consent must be obtained to participate. Your consent will be active for this visit and any virtual visit you may have with one of our providers in the next 365 days. If you have a MyChart account, a copy of this consent can be sent to you electronically.  As this is a virtual visit, video technology does not allow for your provider to perform a traditional examination. This may limit your provider's ability to fully assess your condition. If your provider identifies any concerns that need to be evaluated in person or the need to arrange testing (such as labs, EKG, etc.), we will make arrangements to do so. Although advances in technology are sophisticated, we cannot ensure that it will always work on either your end or our end. If the connection with a video visit is poor, the visit may have to be switched to a telephone visit. With either a video or telephone visit, we are not always able to ensure that we have a secure connection.  By engaging in this virtual visit, you consent to the provision of healthcare and authorize for your insurance to be billed (if applicable) for the services provided during this visit. Depending on your insurance coverage, you may receive a charge related to this service.  I need to obtain your verbal consent now. Are you willing to proceed with your visit today? Harold Schwartz has provided verbal consent on 10/01/2024 for a virtual visit (video or telephone). Harold Schwartz, [place]  Date: 10/01/2024 6:09 PM   Virtual Visit via Video Note   I, Harold Schwartz, connected with  Harold Schwartz  ([id number], April 12, 1956) on 10/01/24 at  6:00 PM EDT by a video-enabled telemedicine application and verified that I am speaking with the correct person using two identifiers.  Location: Patient: Virtual Visit Location Patient:  Home Provider: Virtual Visit Location Provider: Home Office   I discussed the limitations of evaluation and management by telemedicine and the availability of in person appointments. The patient expressed understanding and agreed to proceed.    History of Present Illness: Harold Schwartz is a 68 y.o. who identifies as a male who was assigned male at birth, and is being seen today for fatigue and weight gain.  HPI: 68y/o M presents for a telehealth video visit for c/o feeling fatigued and weight gain approximately 10 lbs over the past month. +family h/o thyroid  disease. Last bloodwork for thyroid  was wnl. A1c wnl too. No changes in diet. Has been exercising less over the last week due to sick family member, but usually walks about 4 mi each day. Doesn't eat much processed foods.      Problems:  Patient Active Problem List   Diagnosis Date Noted   Postphlebitic syndrome 07/21/2024   Muscle spasms of lower extremity 06/18/2024   Pharyngeal dysphagia 03/09/2024   History of colonic polyps 05/28/2023   Polyp of sigmoid colon 05/28/2023   Skin lesion 12/21/2022   Seborrheic keratosis 12/21/2022   Palpitations 03/22/2022   Decreased GFR 10/27/2021   Thrombocytopenia 10/27/2021   Preventative health care 10/27/2021   Testicular mass 10/03/2021   Nocturia 08/17/2020   Erectile dysfunction due to arterial insufficiency 08/17/2020   Benign prostatic hyperplasia with urinary obstruction 03/23/2019   Chronic deep vein thrombosis (DVT) of proximal vein of left lower extremity (HCC) 11/21/2016   Genital herpes 04/25/2015  Male hypogonadism 04/22/2014   Hypertriglyceridemia 04/22/2014   Paroxysmal atrial flutter (HCC) 04/22/2014    Allergies: No Known Allergies Medications:  Current Outpatient Medications:    Cholecalciferol (VITAMIN D3) 50 MCG (2000 UT) CAPS, Take 6,000 Units by mouth., Disp: , Rfl:    cyclobenzaprine  (FLEXERIL ) 5 MG tablet, Take 1 tablet (5 mg total) by mouth at bedtime as  needed., Disp: 20 tablet, Rfl: 0   ELIQUIS  5 MG TABS tablet, TAKE 1 TABLET BY MOUTH TWICE A DAY, Disp: 180 tablet, Rfl: 1   ezetimibe  (ZETIA ) 10 MG tablet, TAKE 1 TABLET BY MOUTH EVERY DAY, Disp: 90 tablet, Rfl: 3   finasteride  (PROSCAR ) 5 MG tablet, Take 1 tablet (5 mg total) by mouth daily., Disp: 90 tablet, Rfl: 3   Magnesium 300 MG CAPS, Take 300 mg by mouth at bedtime. , Disp: , Rfl:    Omega-3 Fatty Acids (FISH OIL) 1200 MG CAPS, Take 2,400-3,600 capsules by mouth daily. Take 2,400 mg by mouth in the morning and 3,600 mg in the evening, Disp: , Rfl:    omeprazole  (PRILOSEC) 20 MG capsule, TAKE 1 CAPSULE BY MOUTH EVERY DAY, Disp: 30 capsule, Rfl: 0   silodosin  (RAPAFLO ) 8 MG CAPS capsule, Take 1 capsule (8 mg total) by mouth in the morning and at bedtime., Disp: 180 capsule, Rfl: 3   tadalafil  (CIALIS ) 20 MG tablet, TAKE ONE TABLET BY MOUTH DAILY AS NEEDED FOR ERECTILE DYSFUNCTION, Disp: 30 tablet, Rfl: 3   Testosterone  Undecanoate (JATENZO ) 237 MG CAPS, Take 1 capsule (237 mg total) by mouth in the morning, at noon, and at bedtime., Disp: 90 capsule, Rfl: 3   valACYclovir  (VALTREX ) 1000 MG tablet, TAKE 1/2 TABLET BY MOUTH DAILY, Disp: 45 tablet, Rfl: 1   vitamin E 180 MG (400 UNITS) capsule, Take 400 Units by mouth daily., Disp: , Rfl:   Observations/Objective: Patient is well-developed, well-nourished in no acute distress.  Resting comfortably  at home.  Head is normocephalic, atraumatic.  No labored breathing.  Speech is clear and coherent with logical content.  Patient is alert and oriented at baseline.    Assessment and Plan: 1. Fatigue, unspecified type (Primary)  Reviewed blood work from July 2025.  Recommended to f/u with PCP for further evaluation which may include blood work for thyroid (even though last TSH was WNL) and iron blood work. Currently takes 2000 international units for Vitamin D3 daily. May consider VitD3 blood work. Last A1C wnl. Pt verbalized understanding  and in agreement.    Follow Up Instructions: I discussed the assessment and treatment plan with the patient. The patient was provided an opportunity to ask questions and all were answered. The patient agreed with the plan and demonstrated an understanding of the instructions.  A copy of instructions were sent to the patient via MyChart unless otherwise noted below.   Patient has requested to receive PHI (AVS, Work Notes, etc) pertaining to this video visit through e-mail as they are currently without active MyChart. They have voiced understand that email is not considered secure and their health information could be viewed by someone other than the patient.   The patient was advised to call back or seek an in-person evaluation if the symptoms worsen or if the condition fails to improve as anticipated.    Navada Osterhout, [name]

## 2024-10-01 NOTE — Patient Instructions (Signed)
 [name], thank you for joining [name], [name] for today's virtual visit.  While this provider is not your primary care provider (PCP), if your PCP is located in our provider database this encounter information will be shared with them immediately following your visit.   [facility] MyChart account gives you access to today's visit and all your visits, tests, and labs performed at [facility]  click here if you don't have a [facility] MyChart account or go to [email or web address]  Consent: (Patient) Makaio Mach provided verbal consent for this virtual visit at the beginning of the encounter.  Current Medications:  Current Outpatient Medications:    Cholecalciferol (VITAMIN D3) 50 MCG (2000 UT) CAPS, Take 6,000 Units by mouth., Disp: , Rfl:    cyclobenzaprine  (FLEXERIL ) 5 MG tablet, Take 1 tablet (5 mg total) by mouth at bedtime as needed., Disp: 20 tablet, Rfl: 0   ELIQUIS  5 MG TABS tablet, TAKE 1 TABLET BY MOUTH TWICE A DAY, Disp: 180 tablet, Rfl: 1   ezetimibe  (ZETIA ) 10 MG tablet, TAKE 1 TABLET BY MOUTH EVERY DAY, Disp: 90 tablet, Rfl: 3   finasteride  (PROSCAR ) 5 MG tablet, Take 1 tablet (5 mg total) by mouth daily., Disp: 90 tablet, Rfl: 3   Magnesium 300 MG CAPS, Take 300 mg by mouth at bedtime. , Disp: , Rfl:    Omega-3 Fatty Acids (FISH OIL) 1200 MG CAPS, Take 2,400-3,600 capsules by mouth daily. Take 2,400 mg by mouth in the morning and 3,600 mg in the evening, Disp: , Rfl:    omeprazole  (PRILOSEC) 20 MG capsule, TAKE 1 CAPSULE BY MOUTH EVERY DAY, Disp: 30 capsule, Rfl: 0   silodosin  (RAPAFLO ) 8 MG CAPS capsule, Take 1 capsule (8 mg total) by mouth in the morning and at bedtime., Disp: 180 capsule, Rfl: 3   tadalafil  (CIALIS ) 20 MG tablet, TAKE ONE TABLET BY MOUTH DAILY AS NEEDED FOR ERECTILE DYSFUNCTION, Disp: 30 tablet, Rfl: 3   Testosterone  Undecanoate (JATENZO ) 237 MG CAPS, Take 1 capsule (237 mg total) by mouth in the morning, at noon, and at  bedtime., Disp: 90 capsule, Rfl: 3   valACYclovir  (VALTREX ) 1000 MG tablet, TAKE 1/2 TABLET BY MOUTH DAILY, Disp: 45 tablet, Rfl: 1   vitamin E 180 MG (400 UNITS) capsule, Take 400 Units by mouth daily., Disp: , Rfl:    Medications ordered in this encounter:  No orders of the defined types were placed in this encounter.    *If you need refills on other medications prior to your next appointment, please contact your pharmacy*  Follow-Up: Call back or seek an in-person evaluation if the symptoms worsen or if the condition fails to improve as anticipated.  [facility] [phone]  Other Instructions Reviewed blood work from July 2025.  Recommended to f/u with PCP for further evaluation which may include blood work for thyroid (even though last TSH was WNL) and iron blood work. Currently takes 2000 international units for Vitamin D3 daily. May consider VitD3 blood work. Last A1C wnl.   If you have been instructed to have an in-person evaluation today at a local Urgent Care facility, please use the link below. It will take you to a list of all of our available [facility], including address, phone number and hours of operation. Please do not delay care.   Urgent Cares  If you or a family member do not have a primary care provider, use the link below to schedule a visit  and establish care. When you choose a [facility] primary care physician or advanced practice provider, you gain a long-term partner in health. Find a Primary Care Provider  Learn more about [facility] in-office and virtual care options: [facility] - Get Care Now

## 2024-10-02 ENCOUNTER — Other Ambulatory Visit: Payer: Self-pay | Admitting: Urology

## 2024-10-02 ENCOUNTER — Encounter: Payer: Self-pay | Admitting: Urology

## 2024-10-02 DIAGNOSIS — E291 Testicular hypofunction: Secondary | ICD-10-CM

## 2024-10-05 ENCOUNTER — Other Ambulatory Visit: Payer: Self-pay

## 2024-10-05 DIAGNOSIS — E291 Testicular hypofunction: Secondary | ICD-10-CM

## 2024-10-06 MED ORDER — JATENZO 237 MG PO CAPS: 1.0000 | ORAL_CAPSULE | Freq: Three times a day (TID) | ORAL | 3 refills | Status: DC

## 2024-10-20 ENCOUNTER — Ambulatory Visit

## 2024-10-20 VITALS — BP 104/74 | HR 73 | Temp 98.9°F | Ht 71.0 in | Wt 217.0 lb

## 2024-10-20 DIAGNOSIS — R635 Abnormal weight gain: Secondary | ICD-10-CM | POA: Diagnosis not present

## 2024-10-20 DIAGNOSIS — R5383 Other fatigue: Secondary | ICD-10-CM | POA: Diagnosis not present

## 2024-10-20 NOTE — Progress Notes (Signed)
 Subjective:   This visit was conducted in person. The patient gave informed consent to the use of Abridge AI technology to record the contents of the encounter as documented below.   Patient ID: Harold Schwartz, male    DOB: 10-Jun-1956, 68 y.o.   MRN: [id number]   Discussed the use of AI scribe software for clinical note transcription with the patient, who gave verbal consent to proceed.  History of Present Illness Harold Schwartz is a 68 year old male who presents with low energy levels.  He has been experiencing low energy levels for several weeks, which is unusual for him. He feels as though his body has no energy, making previously easy tasks, such as yard work, now exhausting. Activities require significantly more effort, although there is no shortness of breath. He has gained approximately 15 pounds over the last few months without changes in diet.  He has a family history of hypothyroidism, with both his mother and sister affected. His thyroid  levels were normal four months ago and two years prior. He has a history of hypogonadism and is on oral testosterone . He missed a few doses while traveling but has been back on it for the last four weeks. His testosterone  levels were normal in May, and he is on the highest dose prescribed to him, which has been difficult to stabilize over the past three years.  He has been under significant stress due to his mother-in-law's illness and recent passing, which has affected his mood and energy levels. He feels sad but not depressed. He reports sleeping well overall, though he wakes several times at night due to an enlarged prostate. He can usually fall back asleep but has not felt rested upon waking recently.  No body aches, night sweats, or other symptoms like shortness of breath. His shoulder injury has improved recently. His weight has increased from 206 pounds in March to 217 pounds currently, with no significant change since August. His appetite  has not increased, and he maintains a healthy diet with his wife, who cooks nutritious meals. He snacks occasionally on pistachios or cheese and crackers. He has resumed walking and yard work, which were disrupted during his mother-in-law's illness.  He works part-time as a education officer, community for clergy, which involves intense conversations that can be tiring. He does not rest during the day and works every other week, typically from 10 AM to 5 PM.   Review of Systems  All other systems reviewed and are negative.       No Known Allergies  Current Outpatient Medications on File Prior to Visit  Medication Sig Dispense Refill   Cholecalciferol (VITAMIN D3) 50 MCG (2000 UT) CAPS Take 6,000 Units by mouth.     cyclobenzaprine  (FLEXERIL ) 5 MG tablet Take 1 tablet (5 mg total) by mouth at bedtime as needed. 20 tablet 0   ELIQUIS  5 MG TABS tablet TAKE 1 TABLET BY MOUTH TWICE A DAY 180 tablet 1   ezetimibe  (ZETIA ) 10 MG tablet TAKE 1 TABLET BY MOUTH EVERY DAY 90 tablet 3   finasteride  (PROSCAR ) 5 MG tablet Take 1 tablet (5 mg total) by mouth daily. 90 tablet 3   Magnesium 300 MG CAPS Take 300 mg by mouth at bedtime.      Omega-3 Fatty Acids (FISH OIL) 1200 MG CAPS Take 2,400-3,600 capsules by mouth daily. Take 2,400 mg by mouth in the morning and 3,600 mg in the evening     omeprazole  (PRILOSEC) 20 MG capsule  TAKE 1 CAPSULE BY MOUTH EVERY DAY 30 capsule 0   silodosin  (RAPAFLO ) 8 MG CAPS capsule Take 1 capsule (8 mg total) by mouth in the morning and at bedtime. 180 capsule 3   tadalafil  (CIALIS ) 20 MG tablet TAKE ONE TABLET BY MOUTH DAILY AS NEEDED FOR ERECTILE DYSFUNCTION 30 tablet 3   Testosterone  Undecanoate (JATENZO ) 237 MG CAPS Take 1 capsule (237 mg total) by mouth in the morning, at noon, and at bedtime. 90 capsule 3   valACYclovir  (VALTREX ) 1000 MG tablet TAKE 1/2 TABLET BY MOUTH DAILY 45 tablet 1   vitamin E 180 MG (400 UNITS) capsule Take 400 Units by mouth daily.     No current  facility-administered medications on file prior to visit.    BP 104/74 (BP Location: Left Arm, Patient Position: Sitting, Cuff Size: Large)   Pulse 73   Temp 98.9 F (37.2 C) (Oral)   Ht 5' 11 (1.803 m)   Wt 217 lb (98.4 kg)   SpO2 93%   BMI 30.27 kg/m   Objective:      Physical Exam GENERAL: Alert, cooperative, well developed, no acute distress. Obese HEAD: Normocephalic atraumatic. EXTREMITIES: No cyanosis or edema. NEUROLOGICAL: Oriented to person, place and time, no gait abnormalities, moves all extremities without gross motor or sensory deficit. PSYCHIATRIC: Flat affect, very tearful       Assessment & Plan:   Assessment & Plan Low energy Symptoms include low energy, difficulty focusing, and emotional fatigue. No current depression, but mild depression may be exacerbated by recent events.  Of note, patient does have a history of hypogonadism, currently on testosterone  therapy, which he has been compliant with. Most recent TSH, testosterone , CBC and CMP were WNL.  However, as symptoms were not present at the time they were done, will reorder at this time.  Autoimmune causes unlikely given lack of other clinical symptoms.  Overall low suspicion for medical cause at this time.  Of note, patient does have a history of depression, not currently on treatment or in therapy, however, recent passing of his MIL may have exacerbated this. Most likely differentials are Grief reaction and caregiver fatigue from several months of caring for her while her health declined.  On clinical exam patient notably has flat affect and is tearful speaking about his MIL. Patient declines referral for grief counseling/therapy with our counselors, preferring instead to connect with a Christian therapist.  Would recommend reassessing mood in the next several weeks.  - Recommended weekly therapy sessions for grief counseling. - Encouraged strengthening mental wellness through his personal faith practices  which patient prefers to do. - Ordered CBC, BMP, TSH  Weight gain 11 pound weight gain over the last several months.  Given normal recent lab workup low suspicion for true medical cause.  Medication review does not reveal any obvious medication cause.  More likely to be diet/lifestyle related, may have been exacerbated by recent grief and the corresponding effect on his mood.  -Will rule out TSH as a potential cause - Encouraged regular exercise: brisk walking 30 minutes, three times a week. - Discussed dietary modifications: portion control and balanced meals.    Return in about 6 weeks (around 12/01/2024) for Low energy and mood.   Murriel Holwerda K Genesis Novosad, MD  10/20/24     Contains text generated by Abridge.

## 2024-10-20 NOTE — Patient Instructions (Addendum)
 Thank you for visiting [facility] today! Here's what we talked about:  - Please find a christian therapist/counsellor and meet at least once weekly for the next few months  - If unsuccessful in finding a therapist pls call clinic  - Start brisk walking for 30 mins 3 times a week - Follow healthy diet with portion control

## 2024-10-21 ENCOUNTER — Other Ambulatory Visit: Payer: Self-pay | Admitting: Nurse Practitioner

## 2024-10-21 ENCOUNTER — Other Ambulatory Visit: Payer: Self-pay | Admitting: Urology

## 2024-10-21 DIAGNOSIS — N5201 Erectile dysfunction due to arterial insufficiency: Secondary | ICD-10-CM

## 2024-10-21 DIAGNOSIS — A6000 Herpesviral infection of urogenital system, unspecified: Secondary | ICD-10-CM

## 2024-10-21 LAB — CBC WITH DIFFERENTIAL/PLATELET
Basophils Absolute: 0.1 K/uL (ref 0.0–0.1)
Basophils Relative: 1.1 % (ref 0.0–3.0)
Eosinophils Absolute: 0.1 K/uL (ref 0.0–0.7)
Eosinophils Relative: 1.9 % (ref 0.0–5.0)
HCT: 47.9 % (ref 39.0–52.0)
Hemoglobin: 16.1 g/dL (ref 13.0–17.0)
Lymphocytes Relative: 28.3 % (ref 12.0–46.0)
Lymphs Abs: 1.8 K/uL (ref 0.7–4.0)
MCHC: 33.6 g/dL (ref 30.0–36.0)
MCV: 91.2 fl (ref 78.0–100.0)
Monocytes Absolute: 0.5 K/uL (ref 0.1–1.0)
Monocytes Relative: 8.3 % (ref 3.0–12.0)
Neutro Abs: 3.9 K/uL (ref 1.4–7.7)
Neutrophils Relative %: 60.4 % (ref 43.0–77.0)
Platelets: 144 K/uL — ABNORMAL LOW (ref 150.0–400.0)
RBC: 5.25 Mil/uL (ref 4.22–5.81)
RDW: 14.3 % (ref 11.5–15.5)
WBC: 6.5 K/uL (ref 4.0–10.5)

## 2024-10-21 LAB — BASIC METABOLIC PANEL WITH GFR
BUN: 21 mg/dL (ref 6–23)
CO2: 29 meq/L (ref 19–32)
Calcium: 8.8 mg/dL (ref 8.4–10.5)
Chloride: 104 meq/L (ref 96–112)
Creatinine, Ser: 1.23 mg/dL (ref 0.40–1.50)
GFR: 59.79 mL/min — ABNORMAL LOW (ref >60.00)
Glucose, Bld: 81 mg/dL (ref 70–99)
Potassium: 4.3 meq/L (ref 3.5–5.1)
Sodium: 140 meq/L (ref 135–145)

## 2024-10-22 LAB — TSH: TSH: 2.5 u[IU]/mL (ref 0.35–5.50)

## 2024-10-23 ENCOUNTER — Encounter: Payer: Self-pay | Admitting: Urology

## 2024-10-23 DIAGNOSIS — N5201 Erectile dysfunction due to arterial insufficiency: Secondary | ICD-10-CM

## 2024-10-24 ENCOUNTER — Ambulatory Visit: Payer: Self-pay

## 2024-10-26 MED ORDER — TADALAFIL 20 MG PO TABS
ORAL_TABLET | ORAL | 3 refills | Status: DC
Start: 2024-10-26 — End: 2024-10-26

## 2024-10-26 MED ORDER — TADALAFIL 20 MG PO TABS: ORAL_TABLET | ORAL | 3 refills | Status: AC

## 2024-11-16 ENCOUNTER — Other Ambulatory Visit

## 2024-11-16 DIAGNOSIS — E291 Testicular hypofunction: Secondary | ICD-10-CM

## 2024-11-17 LAB — TESTOSTERONE,FREE AND TOTAL
Testosterone, Free: 38.4 pg/mL — ABNORMAL HIGH (ref 6.6–18.1)
Testosterone: 477 ng/dL (ref 264–916)

## 2024-11-17 LAB — COMPREHENSIVE METABOLIC PANEL WITH GFR
ALT: 19 IU/L (ref 0–44)
AST: 20 IU/L (ref 0–40)
Albumin: 3.9 g/dL (ref 3.9–4.9)
Alkaline Phosphatase: 39 IU/L — ABNORMAL LOW (ref 47–123)
BUN/Creatinine Ratio: 13 (ref 10–24)
BUN: 14 mg/dL (ref 8–27)
Bilirubin Total: 0.8 mg/dL (ref 0.0–1.2)
CO2: 25 mmol/L (ref 20–29)
Calcium: 8.7 mg/dL (ref 8.6–10.2)
Chloride: 104 mmol/L (ref 96–106)
Creatinine, Ser: 1.07 mg/dL (ref 0.76–1.27)
Globulin, Total: 2.3 g/dL (ref 1.5–4.5)
Glucose: 90 mg/dL (ref 70–99)
Potassium: 4.5 mmol/L (ref 3.5–5.2)
Sodium: 141 mmol/L (ref 134–144)
Total Protein: 6.2 g/dL (ref 6.0–8.5)
eGFR: 75 mL/min/{1.73_m2}

## 2024-11-17 LAB — PSA, TOTAL AND FREE
PSA, Free Pct: 20.2 %
PSA, Free: 0.97 ng/mL
Prostate Specific Ag, Serum: 4.8 ng/mL — ABNORMAL HIGH (ref 0.0–4.0)

## 2024-11-17 LAB — CBC
Hematocrit: 50.9 % (ref 37.5–51.0)
Hemoglobin: 16.9 g/dL (ref 13.0–17.7)
MCH: 30.5 pg (ref 26.6–33.0)
MCHC: 33.2 g/dL (ref 31.5–35.7)
MCV: 92 fL (ref 79–97)
Platelets: 157 x10E3/uL (ref 150–450)
RBC: 5.55 x10E6/uL (ref 4.14–5.80)
RDW: 13.6 % (ref 11.6–15.4)
WBC: 5.7 x10E3/uL (ref 3.4–10.8)

## 2024-11-25 ENCOUNTER — Other Ambulatory Visit: Payer: Self-pay

## 2024-11-25 ENCOUNTER — Ambulatory Visit: Admitting: Urology

## 2024-11-25 VITALS — BP 112/68 | HR 80

## 2024-11-25 DIAGNOSIS — R351 Nocturia: Secondary | ICD-10-CM

## 2024-11-25 DIAGNOSIS — R972 Elevated prostate specific antigen [PSA]: Secondary | ICD-10-CM

## 2024-11-25 DIAGNOSIS — N138 Other obstructive and reflux uropathy: Secondary | ICD-10-CM | POA: Diagnosis not present

## 2024-11-25 DIAGNOSIS — N5201 Erectile dysfunction due to arterial insufficiency: Secondary | ICD-10-CM

## 2024-11-25 DIAGNOSIS — E291 Testicular hypofunction: Secondary | ICD-10-CM

## 2024-11-25 DIAGNOSIS — N401 Enlarged prostate with lower urinary tract symptoms: Secondary | ICD-10-CM | POA: Diagnosis not present

## 2024-11-25 LAB — URINALYSIS, ROUTINE W REFLEX MICROSCOPIC
Bilirubin, UA: NEGATIVE
Glucose, UA: NEGATIVE
Ketones, UA: NEGATIVE
Leukocytes,UA: NEGATIVE
Nitrite, UA: NEGATIVE
Protein,UA: NEGATIVE
RBC, UA: NEGATIVE
Specific Gravity, UA: 1.03 (ref 1.005–1.030)
Urobilinogen, Ur: 0.2 mg/dL (ref 0.2–1.0)
pH, UA: 5.5 (ref 5.0–7.5)

## 2024-11-25 MED ORDER — JATENZO 237 MG PO CAPS: 1.0000 | ORAL_CAPSULE | Freq: Three times a day (TID) | ORAL | 3 refills | Status: AC

## 2024-11-25 MED ORDER — SILODOSIN 8 MG PO CAPS: 8.0000 mg | ORAL_CAPSULE | Freq: Two times a day (BID) | ORAL | 3 refills | Status: AC

## 2024-11-25 MED ORDER — FINASTERIDE 5 MG PO TABS: 5.0000 mg | ORAL_TABLET | Freq: Every day | ORAL | 3 refills | Status: AC

## 2024-11-25 NOTE — Progress Notes (Unsigned)
 11/25/2024 9:24 AM   Harold Schwartz 07-Dec-1956 [id number]  Referring provider: [name] HERO, NP [address] [place],  [place] [address]  No chief complaint on file.   HPI:    PMH: Past Medical History:  Diagnosis Date   Atrial flutter (HCC)    Colon polyps    Depression    DVT (deep vein thrombosis) in pregnancy    last one in March 2019 taking eliquis    Genital herpes    Heart murmur    Hemorrhoids    Hyperlipidemia    Hypogonadism in male    Neuromuscular disorder (HCC)    slight numbnes in right foot    Surgical History: Past Surgical History:  Procedure Laterality Date   A-FLUTTER ABLATION N/A 03/22/2017   Procedure: A-Flutter Ablation;  Surgeon: Elspeth JAYSON Sage, MD;  Location: [facility];  Service: Cardiovascular;  Laterality: N/A;   COLONOSCOPY WITH PROPOFOL  N/A 05/16/2018   Procedure: COLONOSCOPY WITH PROPOFOL ;  Surgeon: Jinny Carmine, MD;  Location: [facility];  Service: Endoscopy;  Laterality: N/A;   COLONOSCOPY WITH PROPOFOL  N/A 05/28/2023   Procedure: COLONOSCOPY WITH PROPOFOL ;  Surgeon: Jinny Carmine, MD;  Location: [facility] ENDOSCOPY;  Service: Endoscopy;  Laterality: N/A;   KNEE ARTHROSCOPY     POLYPECTOMY  05/16/2018   Procedure: POLYPECTOMY;  Surgeon: Jinny Carmine, MD;  Location: [facility];  Service: Endoscopy;;   TENOTOMY ACHILLES TENDON Right 08/22/2018   VASECTOMY  1990    Home Medications:  Allergies as of 11/25/2024   No Known Allergies      Medication List        Accurate as of November 25, 2024  9:24 AM. If you have any questions, ask your nurse or doctor.          cyclobenzaprine  5 MG tablet Commonly known as: FLEXERIL  Take 1 tablet (5 mg total) by mouth at bedtime as needed.   Eliquis  5 MG Tabs tablet Generic drug: apixaban  TAKE 1 TABLET BY MOUTH TWICE A DAY   ezetimibe  10 MG tablet Commonly known as: ZETIA  TAKE 1 TABLET BY MOUTH EVERY DAY   finasteride  5 MG tablet Commonly known as:  PROSCAR  Take 1 tablet (5 mg total) by mouth daily.   Fish Oil 1200 MG Caps Take 2,400-3,600 capsules by mouth daily. Take 2,400 mg by mouth in the morning and 3,600 mg in the evening   Jatenzo  237 MG Caps Generic drug: Testosterone  Undecanoate Take 1 capsule (237 mg total) by mouth in the morning, at noon, and at bedtime.   Magnesium 300 MG Caps Take 300 mg by mouth at bedtime.   omeprazole  20 MG capsule Commonly known as: PRILOSEC TAKE 1 CAPSULE BY MOUTH EVERY DAY   silodosin  8 MG Caps capsule Commonly known as: RAPAFLO  Take 1 capsule (8 mg total) by mouth in the morning and at bedtime.   tadalafil  20 MG tablet Commonly known as: CIALIS  TAKE ONE TABLET BY MOUTH DAILY AS NEEDED FOR ERECTILE DYSFUNCTION   tadalafil  20 MG tablet Commonly known as: CIALIS  TAKE ONE TABLET BY MOUTH DAILY AS NEEDED FOR ERECTILE DYSFUNCTION   valACYclovir  1000 MG tablet Commonly known as: VALTREX  TAKE 1/2 TABLET BY MOUTH DAILY   vitamin D3 50 MCG (2000 UT) Caps Take 6,000 Units by mouth.   vitamin E 180 MG (400 UNITS) capsule Take 400 Units by mouth daily.        Allergies: No Known Allergies  Family History: Family History  Problem Relation Age  of Onset   Breast cancer Mother    Heart disease Mother    Hyperlipidemia Mother    Hypertension Mother    Colon cancer Father    Heart disease Father    Alzheimer's disease Father    Hyperlipidemia Father    Cancer Father    Multiple sclerosis Sister        primary progessive MS   Hyperlipidemia Brother    Cancer Brother 67       Pancreatic 2020   Cancer Brother     Social History:  reports that he has never smoked. He has never used smokeless tobacco. He reports that he does not currently use alcohol after a past usage of about 7.0 standard drinks of alcohol per week. He reports that he does not use drugs.  ROS: All other review of systems were reviewed and are negative except what is noted above in HPI  Physical Exam: BP  112/68   Pulse 80   Constitutional:  Alert and oriented, No acute distress. HEENT: [place] AT, moist mucus membranes.  Trachea midline, no masses. Cardiovascular: No clubbing, cyanosis, or edema. Respiratory: Normal respiratory effort, no increased work of breathing. GI: Abdomen is soft, nontender, nondistended, no abdominal masses GU: No CVA tenderness.  Lymph: No cervical or inguinal lymphadenopathy. Skin: No rashes, bruises or suspicious lesions. Neurologic: Grossly intact, no focal deficits, moving all 4 extremities. Psychiatric: Normal mood and affect.  Laboratory Data: Lab Results  Component Value Date   WBC 5.7 11/16/2024   HGB 16.9 11/16/2024   HCT 50.9 11/16/2024   MCV 92 11/16/2024   PLT 157 11/16/2024    Lab Results  Component Value Date   CREATININE 1.07 11/16/2024    Lab Results  Component Value Date   PSA 3.55 06/18/2024   PSA 3.61 10/21/2017   PSA 3.47 06/29/2016    Lab Results  Component Value Date   TESTOSTERONE  477 11/16/2024    Lab Results  Component Value Date   HGBA1C 5.4 06/18/2024    Urinalysis    Component Value Date/Time   APPEARANCEUR Clear 08/09/2023 1207   GLUCOSEU Negative 08/09/2023 1207   BILIRUBINUR Negative 08/09/2023 1207   KETONESUR negative 05/29/2022 1346   PROTEINUR Negative 08/09/2023 1207   UROBILINOGEN 0.2 05/29/2022 1346   NITRITE Negative 08/09/2023 1207   LEUKOCYTESUR Negative 08/09/2023 1207    Lab Results  Component Value Date   LABMICR Comment 08/09/2023    Pertinent Imaging: *** No results found for this or any previous visit.  No results found for this or any previous visit.  No results found for this or any previous visit.  No results found for this or any previous visit.  No results found for this or any previous visit.  No results found for this or any previous visit.  No results found for this or any previous visit.  No results found for this or any previous visit.   Assessment & Plan:     1. Elevated PSA (Primary) [place] 3 months with PSA and then 6 months with a PSA - Urinalysis, Routine w reflex microscopic  2. Erectile dysfunction due to arterial insufficiency ***  3. Hypogonadism male ***  4. Benign prostatic hyperplasia with urinary obstruction ***  5. Nocturia ***   No follow-ups on file.  Belvie Clara, MD  [facility] [place]

## 2024-12-01 ENCOUNTER — Ambulatory Visit: Admitting: Nurse Practitioner

## 2024-12-01 VITALS — BP 122/70 | HR 76 | Temp 97.8°F | Ht 71.0 in | Wt 217.0 lb

## 2024-12-01 DIAGNOSIS — F4321 Adjustment disorder with depressed mood: Secondary | ICD-10-CM

## 2024-12-01 NOTE — Progress Notes (Signed)
 Established Patient Office Visit  Subjective   Patient ID: Harold Schwartz, male    DOB: 1956/03/23  Age: 68 y.o. MRN: [id number]  Chief Complaint  Patient presents with   Follow-up    Pt complains of doing well.     HPI  Discussed the use of AI scribe software for clinical note transcription with the patient, who gave verbal consent to proceed.  History of Present Illness Harold Schwartz is a 68 year old male who presents with mood changes and low energy related to grief and caregiver stress.  He has been experiencing low energy and mood changes over the past several weeks, attributed to grief following the death of his 50 year old mother-in-law. He and his family were with her during the last month of her life. He describes feeling 'down' and lacking motivation during this period.  He has been engaging in conversations with others, which he finds helpful in processing his feelings. Over the past six weeks, he notes significant improvement in his mood and energy levels, stating he feels 'almost back to normal'.  His sleep pattern is consistent, going to bed between 9:00 and 10:30 PM and waking up around 7:45 to 8:00 AM, totaling approximately ten hours of sleep per night. He reports waking up once or twice to urinate, which he considers normal for him and notes improvement in this aspect as well.  No thoughts of self-harm or harm to others and no hallucinations.      12/01/2024    8:54 AM 10/20/2024    3:00 PM 06/18/2024    9:43 AM  PHQ9 SCORE ONLY  PHQ-9 Total Score 1 0 0      Data saved with a previous flowsheet row definition       12/01/2024    8:54 AM 06/18/2024    8:29 AM 03/09/2024    9:00 AM 04/22/2023    1:59 PM  GAD 7 : Generalized Anxiety Score  Nervous, Anxious, on Edge 0 1 0 1  Control/stop worrying 0 0 0 0  Worry too much - different things 0 2 0 0  Trouble relaxing 0 0 0 1  Restless 0 0 0 0  Easily annoyed or irritable 0 0 0 0  Afraid - awful might  happen 0 0 0 0  Total GAD 7 Score 0 3 0 2  Anxiety Difficulty Not difficult at all Not difficult at all Not difficult at all Not difficult at all      Review of Systems  Constitutional:  Negative for chills and fever.  Respiratory:  Negative for shortness of breath.   Cardiovascular:  Negative for chest pain.  Psychiatric/Behavioral:  Negative for hallucinations and suicidal ideas.       Objective:     BP 122/70   Pulse 76   Temp 97.8 F (36.6 C) (Oral)   Ht 5' 11 (1.803 m)   Wt 217 lb (98.4 kg)   SpO2 94%   BMI 30.27 kg/m    Physical Exam Vitals and nursing note reviewed.  Constitutional:      Appearance: Normal appearance.  Cardiovascular:     Rate and Rhythm: Normal rate and regular rhythm.     Heart sounds: Normal heart sounds.  Pulmonary:     Effort: Pulmonary effort is normal.     Breath sounds: Normal breath sounds.  Neurological:     Mental Status: He is alert.      No results found for any visits on 12/01/24.  The 10-year ASCVD risk score (Arnett DK, et al., 2019) is: 14.1%    Assessment & Plan:   Problem List Items Addressed This Visit   None Visit Diagnoses       Grief    -  Primary     Assessment and Plan Assessment & Plan Grief Symptoms improved significantly. No current depressive symptoms or self-harm thoughts. - Continue current management and monitor symptoms. - Advised to contact provider if symptoms worsen or persist beyond two months.  Nocturia Improvement noted with fluid restrictions and anxiety management. - Continue current management strategies. -followed by urology      Return in about 7 months (around 07/01/2025) for CPE and Labs.    Adina Crandall, NP

## 2024-12-01 NOTE — Patient Instructions (Signed)
 Nice to see you today I want to see you in 7 months for your physical, sooner if you need me

## 2024-12-08 ENCOUNTER — Encounter: Payer: Self-pay | Admitting: Urology

## 2024-12-08 NOTE — Patient Instructions (Signed)

## 2024-12-15 ENCOUNTER — Telehealth: Admitting: Family Medicine

## 2024-12-15 DIAGNOSIS — J111 Influenza due to unidentified influenza virus with other respiratory manifestations: Secondary | ICD-10-CM

## 2024-12-15 MED ORDER — OSELTAMIVIR PHOSPHATE 75 MG PO CAPS: 75.0000 mg | ORAL_CAPSULE | Freq: Two times a day (BID) | ORAL | 0 refills | Status: AC

## 2024-12-15 MED ORDER — PROMETHAZINE-DM 6.25-15 MG/5ML PO SYRP: 5.0000 mL | ORAL_SOLUTION | Freq: Four times a day (QID) | ORAL | 0 refills | Status: AC | PRN

## 2024-12-15 NOTE — Progress Notes (Signed)
 " Virtual Visit Consent   Harold Schwartz, you are scheduled for a virtual visit with a [facility] provider today. Just as with appointments in the office, your consent must be obtained to participate. Your consent will be active for this visit and any virtual visit you may have with one of our providers in the next 365 days. If you have a MyChart account, a copy of this consent can be sent to you electronically.  As this is a virtual visit, video technology does not allow for your provider to perform a traditional examination. This may limit your provider's ability to fully assess your condition. If your provider identifies any concerns that need to be evaluated in person or the need to arrange testing (such as labs, EKG, etc.), we will make arrangements to do so. Although advances in technology are sophisticated, we cannot ensure that it will always work on either your end or our end. If the connection with a video visit is poor, the visit may have to be switched to a telephone visit. With either a video or telephone visit, we are not always able to ensure that we have a secure connection.  By engaging in this virtual visit, you consent to the provision of healthcare and authorize for your insurance to be billed (if applicable) for the services provided during this visit. Depending on your insurance coverage, you may receive a charge related to this service.  I need to obtain your verbal consent now. Are you willing to proceed with your visit today? Rajah Tagliaferro has provided verbal consent on 12/15/2024 for a virtual visit (video or telephone). Loa Lamp, FNP  Date: 12/15/2024 5:25 PM   Virtual Visit via Video Note   I, Loa Lamp, connected with  Harold Schwartz  ([id number], 1956-11-21) on 12/15/2024 at  5:30 PM EST by a video-enabled telemedicine application and verified that I am speaking with the correct person using two identifiers.  Location: Patient: Virtual Visit Location Patient:  Home Provider: Virtual Visit Location Provider: Home Office   I discussed the limitations of evaluation and management by telemedicine and the availability of in person appointments. The patient expressed understanding and agreed to proceed.    History of Present Illness: Harold Schwartz is a 68 y.o. who identifies as a male who was assigned male at birth, and is being seen today for cough, aching headache, sore throat, sx for 1 days, no sob, slight wheezing, cough keeps him up at night. Tested positive at home. SABRA  HPI: HPI  Problems:  Patient Active Problem List   Diagnosis Date Noted   Postphlebitic syndrome 07/21/2024   Muscle spasms of lower extremity 06/18/2024   Pharyngeal dysphagia 03/09/2024   History of colonic polyps 05/28/2023   Polyp of sigmoid colon 05/28/2023   Skin lesion 12/21/2022   Seborrheic keratosis 12/21/2022   Palpitations 03/22/2022   Decreased GFR 10/27/2021   Thrombocytopenia 10/27/2021   Preventative health care 10/27/2021   Testicular mass 10/03/2021   Nocturia 08/17/2020   Erectile dysfunction due to arterial insufficiency 08/17/2020   Benign prostatic hyperplasia with urinary obstruction 03/23/2019   Chronic deep vein thrombosis (DVT) of proximal vein of left lower extremity (HCC) 11/21/2016   Genital herpes 04/25/2015   Male hypogonadism 04/22/2014   Hypertriglyceridemia 04/22/2014   Paroxysmal atrial flutter (HCC) 04/22/2014    Allergies: Allergies[1] Medications: Current Medications[2]  Observations/Objective: Patient is well-developed, well-nourished in no acute distress.  Resting comfortably  at home.  Head is normocephalic, atraumatic.  No labored  breathing.  Speech is clear and coherent with logical content.  Patient is alert and oriented at baseline.    Assessment and Plan: 1. Influenza (Primary)  Increase fluids, humidifier at night, UC as needed.   Follow Up Instructions: I discussed the assessment and treatment plan with the  patient. The patient was provided an opportunity to ask questions and all were answered. The patient agreed with the plan and demonstrated an understanding of the instructions.  A copy of instructions were sent to the patient via MyChart unless otherwise noted below.     The patient was advised to call back or seek an in-person evaluation if the symptoms worsen or if the condition fails to improve as anticipated.    Panayiota Larkin, FNP     [1] No Known Allergies [2]  Current Outpatient Medications:    oseltamivir (TAMIFLU) 75 MG capsule, Take 1 capsule (75 mg total) by mouth 2 (two) times daily for 5 days., Disp: 10 capsule, Rfl: 0   promethazine-dextromethorphan (PROMETHAZINE-DM) 6.25-15 MG/5ML syrup, Take 5 mLs by mouth 4 (four) times daily as needed for up to 10 days for cough., Disp: 118 mL, Rfl: 0   Cholecalciferol (VITAMIN D3) 50 MCG (2000 UT) CAPS, Take 6,000 Units by mouth., Disp: , Rfl:    cyclobenzaprine  (FLEXERIL ) 5 MG tablet, Take 1 tablet (5 mg total) by mouth at bedtime as needed., Disp: 20 tablet, Rfl: 0   ELIQUIS  5 MG TABS tablet, TAKE 1 TABLET BY MOUTH TWICE A DAY, Disp: 180 tablet, Rfl: 1   ezetimibe  (ZETIA ) 10 MG tablet, TAKE 1 TABLET BY MOUTH EVERY DAY, Disp: 90 tablet, Rfl: 3   finasteride  (PROSCAR ) 5 MG tablet, Take 1 tablet (5 mg total) by mouth daily., Disp: 90 tablet, Rfl: 3   Magnesium 300 MG CAPS, Take 300 mg by mouth at bedtime. , Disp: , Rfl:    Omega-3 Fatty Acids (FISH OIL) 1200 MG CAPS, Take 2,400-3,600 capsules by mouth daily. Take 2,400 mg by mouth in the morning and 3,600 mg in the evening, Disp: , Rfl:    omeprazole  (PRILOSEC) 20 MG capsule, TAKE 1 CAPSULE BY MOUTH EVERY DAY, Disp: 30 capsule, Rfl: 0   silodosin  (RAPAFLO ) 8 MG CAPS capsule, Take 1 capsule (8 mg total) by mouth in the morning and at bedtime., Disp: 180 capsule, Rfl: 3   tadalafil  (CIALIS ) 20 MG tablet, TAKE ONE TABLET BY MOUTH DAILY AS NEEDED FOR ERECTILE DYSFUNCTION, Disp: 30 tablet, Rfl: 3    tadalafil  (CIALIS ) 20 MG tablet, TAKE ONE TABLET BY MOUTH DAILY AS NEEDED FOR ERECTILE DYSFUNCTION, Disp: 90 tablet, Rfl: 3   Testosterone  Undecanoate (JATENZO ) 237 MG CAPS, Take 1 capsule (237 mg total) by mouth in the morning, at noon, and at bedtime., Disp: 90 capsule, Rfl: 3   valACYclovir  (VALTREX ) 1000 MG tablet, TAKE 1/2 TABLET BY MOUTH DAILY, Disp: 45 tablet, Rfl: 1   vitamin E 180 MG (400 UNITS) capsule, Take 400 Units by mouth daily., Disp: , Rfl:   "

## 2024-12-15 NOTE — Patient Instructions (Signed)

## 2024-12-16 ENCOUNTER — Encounter: Payer: Self-pay | Admitting: Nurse Practitioner

## 2024-12-16 DIAGNOSIS — J111 Influenza due to unidentified influenza virus with other respiratory manifestations: Secondary | ICD-10-CM

## 2024-12-16 NOTE — Telephone Encounter (Signed)
 See my chart request

## 2024-12-17 MED ORDER — PREDNISONE 20 MG PO TABS: 20.0000 mg | ORAL_TABLET | Freq: Two times a day (BID) | ORAL | 0 refills | Status: AC

## 2024-12-21 ENCOUNTER — Ambulatory Visit (INDEPENDENT_AMBULATORY_CARE_PROVIDER_SITE_OTHER)

## 2024-12-21 ENCOUNTER — Ambulatory Visit
Admission: EM | Admit: 2024-12-21 | Discharge: 2024-12-21 | Disposition: A | Discharge status: Home / Self Care | Attending: Emergency Medicine | Admitting: Emergency Medicine

## 2024-12-21 DIAGNOSIS — R058 Other specified cough: Secondary | ICD-10-CM

## 2024-12-21 DIAGNOSIS — J069 Acute upper respiratory infection, unspecified: Secondary | ICD-10-CM

## 2024-12-21 MED ORDER — AZITHROMYCIN 250 MG PO TABS: 250.0000 mg | ORAL_TABLET | Freq: Every day | ORAL | 0 refills | Status: AC

## 2024-12-21 NOTE — Discharge Instructions (Addendum)
 Your chest x-ray is pending.  I will call you with the result.    Take the Zithromax  as directed.    Follow up with your primary care provider.  Go to the emergency department if you have worsening symptoms.

## 2024-12-21 NOTE — ED Triage Notes (Signed)
 Patient to Urgent Care with complaints of a productive cough/ feels a crackle in the top of his lungs when he lays down.   Reports symptoms x1 week. Recent flu- has two days left of prednisone .  Completed tamiflu .

## 2024-12-21 NOTE — ED Provider Notes (Signed)
 " Harold Schwartz    CSN: [id number] Arrival date & time: 12/21/24  9060      History   Chief Complaint Chief Complaint  Patient presents with   Cough   Shortness of Breath    HPI Harold Schwartz is a 69 y.o. male.  Patient presents with 1 week history of cough and congestion.  In the last few days, he has noted a crackle in his upper airway which is worse when he lies down.  He is currently on prednisone  and recently completed Tamiflu  for influenza.  No OTC medication taken today.  No fever, chest pain, shortness of breath.  Patient had a telehealth visit on 12/15/2024; he reported positive flu test at home and was prescribed Tamiflu  and Promethazine  DM.  He reached out to his PCP on 12/16/2024 through MyChart and was prescribed prednisone  also.  The history is provided by the patient and medical records.    Past Medical History:  Diagnosis Date   Atrial flutter (HCC)    Colon polyps    Depression    DVT (deep vein thrombosis) in pregnancy    last one in March 2019 taking eliquis    Genital herpes    Heart murmur    Hemorrhoids    Hyperlipidemia    Hypogonadism in male    Neuromuscular disorder (HCC)    slight numbnes in right foot    Patient Active Problem List   Diagnosis Date Noted   Postphlebitic syndrome 07/21/2024   Muscle spasms of lower extremity 06/18/2024   Pharyngeal dysphagia 03/09/2024   History of colonic polyps 05/28/2023   Polyp of sigmoid colon 05/28/2023   Skin lesion 12/21/2022   Seborrheic keratosis 12/21/2022   Palpitations 03/22/2022   Decreased GFR 10/27/2021   Thrombocytopenia 10/27/2021   Preventative health care 10/27/2021   Testicular mass 10/03/2021   Nocturia 08/17/2020   Erectile dysfunction due to arterial insufficiency 08/17/2020   Benign prostatic hyperplasia with urinary obstruction 03/23/2019   Chronic deep vein thrombosis (DVT) of proximal vein of left lower extremity (HCC) 11/21/2016   Genital herpes 04/25/2015   Male  hypogonadism 04/22/2014   Hypertriglyceridemia 04/22/2014   Paroxysmal atrial flutter (HCC) 04/22/2014    Past Surgical History:  Procedure Laterality Date   A-FLUTTER ABLATION N/A 03/22/2017   Procedure: A-Flutter Ablation;  Surgeon: Elspeth JAYSON Sage, MD;  Location: [facility] INVASIVE CV LAB;  Service: Cardiovascular;  Laterality: N/A;   COLONOSCOPY WITH PROPOFOL  N/A 05/16/2018   Procedure: COLONOSCOPY WITH PROPOFOL ;  Surgeon: Jinny Carmine, MD;  Location: [facility];  Service: Endoscopy;  Laterality: N/A;   COLONOSCOPY WITH PROPOFOL  N/A 05/28/2023   Procedure: COLONOSCOPY WITH PROPOFOL ;  Surgeon: Jinny Carmine, MD;  Location: [facility] ENDOSCOPY;  Service: Endoscopy;  Laterality: N/A;   KNEE ARTHROSCOPY     POLYPECTOMY  05/16/2018   Procedure: POLYPECTOMY;  Surgeon: Jinny Carmine, MD;  Location: [facility];  Service: Endoscopy;;   TENOTOMY ACHILLES TENDON Right 08/22/2018   VASECTOMY  1990       Home Medications    Prior to Admission medications  Medication Sig Start Date End Date Taking? Authorizing Provider  azithromycin  (ZITHROMAX ) 250 MG tablet Take 1 tablet (250 mg total) by mouth daily. Take first 2 tablets together, then 1 every day until finished. 12/21/24  Yes Corlis Burnard DEL, NP  Cholecalciferol (VITAMIN D3) 50 MCG (2000 UT) CAPS Take 6,000 Units by mouth.    [provider]  cyclobenzaprine  (FLEXERIL ) 5 MG tablet Take 1 tablet (5  mg total) by mouth at bedtime as needed. 06/18/24   Wendee Lynwood HERO, NP  ELIQUIS  5 MG TABS tablet TAKE 1 TABLET BY MOUTH TWICE A DAY 08/02/24   Webb, Padonda B, FNP  ezetimibe  (ZETIA ) 10 MG tablet TAKE 1 TABLET BY MOUTH EVERY DAY 06/26/24   Wendee Lynwood HERO, NP  finasteride  (PROSCAR ) 5 MG tablet Take 1 tablet (5 mg total) by mouth daily. 11/25/24   McKenzie, Belvie CROME, MD  Magnesium 300 MG CAPS Take 300 mg by mouth at bedtime.     [provider]  Omega-3 Fatty Acids (FISH OIL) 1200 MG CAPS Take 2,400-3,600 capsules by mouth daily. Take  2,400 mg by mouth in the morning and 3,600 mg in the evening    [provider]  omeprazole  (PRILOSEC) 20 MG capsule TAKE 1 CAPSULE BY MOUTH EVERY DAY 04/06/24   Wendee Lynwood HERO, NP  predniSONE  (DELTASONE ) 20 MG tablet Take 1 tablet (20 mg total) by mouth 2 (two) times daily with a meal for 5 days. 12/17/24 12/22/24  Blair, Diane W, FNP  promethazine -dextromethorphan (PROMETHAZINE -DM) 6.25-15 MG/5ML syrup Take 5 mLs by mouth 4 (four) times daily as needed for up to 10 days for cough. 12/15/24 12/25/24  Blair, Diane W, FNP  silodosin  (RAPAFLO ) 8 MG CAPS capsule Take 1 capsule (8 mg total) by mouth in the morning and at bedtime. 11/25/24   McKenzie, Belvie CROME, MD  tadalafil  (CIALIS ) 20 MG tablet TAKE ONE TABLET BY MOUTH DAILY AS NEEDED FOR ERECTILE DYSFUNCTION 10/27/24   McKenzie, Belvie CROME, MD  tadalafil  (CIALIS ) 20 MG tablet TAKE ONE TABLET BY MOUTH DAILY AS NEEDED FOR ERECTILE DYSFUNCTION 10/26/24   McKenzie, Belvie CROME, MD  Testosterone  Undecanoate (JATENZO ) 237 MG CAPS Take 1 capsule (237 mg total) by mouth in the morning, at noon, and at bedtime. 11/25/24   McKenzie, Belvie CROME, MD  valACYclovir  (VALTREX ) 1000 MG tablet TAKE 1/2 TABLET BY MOUTH DAILY 10/21/24   Wendee Lynwood HERO, NP  vitamin E 180 MG (400 UNITS) capsule Take 400 Units by mouth daily.    [provider]    Family History Family History  Problem Relation Age of Onset   Breast cancer Mother    Heart disease Mother    Hyperlipidemia Mother    Hypertension Mother    Colon cancer Father    Heart disease Father    Alzheimer's disease Father    Hyperlipidemia Father    Cancer Father    Multiple sclerosis Sister        primary progessive MS   Hyperlipidemia Brother    Cancer Brother 38       Pancreatic 2020   Cancer Brother     Social History Social History[1]   Allergies   Patient has no known allergies.   Review of Systems Review of Systems  Constitutional:  Negative for chills and fever.  HENT:  Positive  for congestion. Negative for ear pain and sore throat.   Respiratory:  Positive for cough. Negative for shortness of breath.   Cardiovascular:  Negative for chest pain and palpitations.     Physical Exam Triage Vital Signs ED Triage Vitals  Encounter Vitals Group     BP 12/21/24 1017 130/86     Girls Systolic BP Percentile --      Girls Diastolic BP Percentile --      Boys Systolic BP Percentile --      Boys Diastolic BP Percentile --      Pulse Rate  12/21/24 1017 64     Resp 12/21/24 1017 18     Temp 12/21/24 1017 (!) 97.5 F (36.4 C)     Temp src --      SpO2 12/21/24 1017 96 %     Weight --      Height --      Head Circumference --      Peak Flow --      Pain Score 12/21/24 1024 0     Pain Loc --      Pain Education --      Exclude from Growth Chart --    No data found.  Updated Vital Signs BP 130/86   Pulse 64   Temp (!) 97.5 F (36.4 C)   Resp 18   SpO2 96%   Visual Acuity Right Eye Distance:   Left Eye Distance:   Bilateral Distance:    Right Eye Near:   Left Eye Near:    Bilateral Near:     Physical Exam Constitutional:      General: He is not in acute distress. HENT:     Right Ear: Tympanic membrane normal.     Left Ear: Tympanic membrane normal.     Nose: Nose normal.     Mouth/Throat:     Mouth: Mucous membranes are moist.     Pharynx: Oropharynx is clear.     Comments: PND Cardiovascular:     Rate and Rhythm: Normal rate and regular rhythm.     Heart sounds: Normal heart sounds.  Pulmonary:     Effort: Pulmonary effort is normal. No respiratory distress.     Breath sounds: Normal breath sounds.  Neurological:     Mental Status: He is alert.      UC Treatments / Results  Labs (all labs ordered are listed, but only abnormal results are displayed) Labs Reviewed - No data to display  EKG   Radiology DG Chest 2 View Result Date: 12/21/2024 CLINICAL DATA:  Cough. EXAM: CHEST - 2 VIEW COMPARISON:  07/23/2020. FINDINGS: The heart  size and mediastinal contours are within normal limits. No focal consolidation, pleural effusion, or pneumothorax. No acute osseous abnormality. IMPRESSION: No acute cardiopulmonary findings. Electronically Signed   By: Harrietta Sherry M.D.   On: 12/21/2024 12:05    Procedures Procedures (including critical care time)  Medications Ordered in UC Medications - No data to display  Initial Impression / Assessment and Plan / UC Course  I have reviewed the triage vital signs and the nursing notes.  Pertinent labs & imaging results that were available during my care of the patient were reviewed by me and considered in my medical decision making (see chart for details).   Productive cough.  Afebrile and vital signs are stable.  Lungs are clear at this time and O2 sat is 96% on room air.  CXR negative.  Treating today with Zithromax .  Education provided on cough and upper respiratory infection.  Instructed patient to follow-up with his PCP.  ED precautions given.  He agrees to plan of care.  Final Clinical Impressions(s) / UC Diagnoses   Final diagnoses:  Productive cough  Acute upper respiratory infection     Discharge Instructions      Your chest x-ray is pending.  I will call you with the result.    Take the Zithromax  as directed.    Follow up with your primary care provider.  Go to the emergency department if you have worsening symptoms.  ED Prescriptions     Medication Sig Dispense Auth. Provider   azithromycin  (ZITHROMAX ) 250 MG tablet Take 1 tablet (250 mg total) by mouth daily. Take first 2 tablets together, then 1 every day until finished. 6 tablet Corlis Burnard DEL, NP      PDMP not reviewed this encounter.     [1]  Social History Tobacco Use   Smoking status: Never   Smokeless tobacco: Never  Vaping Use   Vaping status: Never Used  Substance Use Topics   Alcohol use: Not Currently    Alcohol/week: 7.0 standard drinks of alcohol    Types: 7 Glasses of  wine per week    Comment: moderate   Drug use: No     Corlis Burnard DEL, NP 12/21/24 1222  "

## 2025-05-28 ENCOUNTER — Ambulatory Visit: Admitting: Urology

## 2025-06-22 ENCOUNTER — Ambulatory Visit

## 2025-07-01 ENCOUNTER — Encounter: Admitting: Nurse Practitioner
